# Patient Record
Sex: Male | Born: 1935 | Race: White | Hispanic: No | Marital: Married | Smoking: Never smoker
Health system: Southern US, Community
[De-identification: ages and names within clinical notes are randomized; demographics above are authoritative.]

## PROBLEM LIST (undated history)

## (undated) DIAGNOSIS — C61 Malignant neoplasm of prostate: Secondary | ICD-10-CM

## (undated) DIAGNOSIS — R350 Frequency of micturition: Secondary | ICD-10-CM

## (undated) DIAGNOSIS — I10 Essential (primary) hypertension: Secondary | ICD-10-CM

## (undated) DIAGNOSIS — M1990 Unspecified osteoarthritis, unspecified site: Secondary | ICD-10-CM

## (undated) DIAGNOSIS — E785 Hyperlipidemia, unspecified: Secondary | ICD-10-CM

## (undated) HISTORY — DX: Unspecified osteoarthritis, unspecified site: M19.90

## (undated) HISTORY — PX: ELBOW SURGERY: SHX618

## (undated) HISTORY — DX: Essential (primary) hypertension: I10

## (undated) HISTORY — PX: BACK SURGERY: SHX140

## (undated) HISTORY — DX: Hyperlipidemia, unspecified: E78.5

## (undated) SURGERY — Surgical Case
Anesthesia: *Unknown

---

## 1993-07-20 HISTORY — PX: PROSTATE SURGERY: SHX751

## 2000-09-12 ENCOUNTER — Encounter: Admission: RE | Admit: 2000-09-12 | Discharge: 2000-12-11 | Payer: Self-pay | Admitting: Radiation Oncology

## 2001-04-19 ENCOUNTER — Ambulatory Visit: Admission: RE | Admit: 2001-04-19 | Discharge: 2001-07-18 | Payer: Self-pay | Admitting: Radiation Oncology

## 2001-09-09 ENCOUNTER — Encounter: Admission: RE | Admit: 2001-09-09 | Discharge: 2001-09-09 | Payer: Self-pay | Admitting: Internal Medicine

## 2001-09-09 ENCOUNTER — Encounter: Payer: Self-pay | Admitting: Internal Medicine

## 2001-09-23 ENCOUNTER — Encounter: Payer: Self-pay | Admitting: Thoracic Surgery

## 2001-09-27 ENCOUNTER — Encounter: Payer: Self-pay | Admitting: Thoracic Surgery

## 2001-09-27 ENCOUNTER — Encounter (INDEPENDENT_AMBULATORY_CARE_PROVIDER_SITE_OTHER): Payer: Self-pay | Admitting: Specialist

## 2001-09-27 ENCOUNTER — Inpatient Hospital Stay (HOSPITAL_COMMUNITY): Admission: RE | Admit: 2001-09-27 | Discharge: 2001-10-02 | Payer: Self-pay | Admitting: Thoracic Surgery

## 2001-09-28 ENCOUNTER — Encounter: Payer: Self-pay | Admitting: Thoracic Surgery

## 2001-09-29 ENCOUNTER — Encounter: Payer: Self-pay | Admitting: Thoracic Surgery

## 2001-09-30 ENCOUNTER — Encounter: Payer: Self-pay | Admitting: Thoracic Surgery

## 2001-10-11 ENCOUNTER — Encounter: Payer: Self-pay | Admitting: Thoracic Surgery

## 2001-10-11 ENCOUNTER — Encounter: Admission: RE | Admit: 2001-10-11 | Discharge: 2001-10-11 | Payer: Self-pay | Admitting: Thoracic Surgery

## 2001-11-03 ENCOUNTER — Encounter: Payer: Self-pay | Admitting: Thoracic Surgery

## 2001-11-03 ENCOUNTER — Encounter: Admission: RE | Admit: 2001-11-03 | Discharge: 2001-11-03 | Payer: Self-pay | Admitting: Thoracic Surgery

## 2001-12-14 ENCOUNTER — Encounter: Admission: RE | Admit: 2001-12-14 | Discharge: 2001-12-14 | Payer: Self-pay | Admitting: Thoracic Surgery

## 2001-12-14 ENCOUNTER — Encounter: Payer: Self-pay | Admitting: Thoracic Surgery

## 2002-03-29 ENCOUNTER — Encounter: Admission: RE | Admit: 2002-03-29 | Discharge: 2002-03-29 | Payer: Self-pay | Admitting: Thoracic Surgery

## 2002-03-29 ENCOUNTER — Encounter: Payer: Self-pay | Admitting: Thoracic Surgery

## 2002-08-04 ENCOUNTER — Encounter: Payer: Self-pay | Admitting: Thoracic Surgery

## 2002-08-04 ENCOUNTER — Encounter: Admission: RE | Admit: 2002-08-04 | Discharge: 2002-08-04 | Payer: Self-pay | Admitting: Thoracic Surgery

## 2003-02-09 ENCOUNTER — Encounter: Admission: RE | Admit: 2003-02-09 | Discharge: 2003-02-09 | Payer: Self-pay | Admitting: Thoracic Surgery

## 2003-02-09 ENCOUNTER — Encounter: Payer: Self-pay | Admitting: Thoracic Surgery

## 2003-07-21 HISTORY — PX: LUNG BIOPSY: SHX232

## 2003-07-21 HISTORY — PX: LOBECTOMY: SHX5089

## 2003-08-15 ENCOUNTER — Encounter: Admission: RE | Admit: 2003-08-15 | Discharge: 2003-08-15 | Payer: Self-pay | Admitting: Thoracic Surgery

## 2003-10-05 ENCOUNTER — Encounter: Admission: RE | Admit: 2003-10-05 | Discharge: 2003-10-05 | Payer: Self-pay | Admitting: Gastroenterology

## 2003-10-24 ENCOUNTER — Encounter: Admission: RE | Admit: 2003-10-24 | Discharge: 2003-10-24 | Payer: Self-pay | Admitting: Specialist

## 2004-01-03 ENCOUNTER — Inpatient Hospital Stay (HOSPITAL_COMMUNITY): Admission: RE | Admit: 2004-01-03 | Discharge: 2004-01-07 | Payer: Self-pay | Admitting: Orthopaedic Surgery

## 2004-02-27 ENCOUNTER — Encounter: Admission: RE | Admit: 2004-02-27 | Discharge: 2004-02-27 | Payer: Self-pay | Admitting: Thoracic Surgery

## 2004-09-10 ENCOUNTER — Encounter: Admission: RE | Admit: 2004-09-10 | Discharge: 2004-09-10 | Payer: Self-pay | Admitting: Thoracic Surgery

## 2005-03-18 ENCOUNTER — Encounter: Admission: RE | Admit: 2005-03-18 | Discharge: 2005-03-18 | Payer: Self-pay | Admitting: Thoracic Surgery

## 2005-09-16 ENCOUNTER — Encounter: Admission: RE | Admit: 2005-09-16 | Discharge: 2005-09-16 | Payer: Self-pay | Admitting: Thoracic Surgery

## 2006-03-17 ENCOUNTER — Encounter: Admission: RE | Admit: 2006-03-17 | Discharge: 2006-03-17 | Payer: Self-pay | Admitting: Thoracic Surgery

## 2006-09-15 ENCOUNTER — Encounter: Admission: RE | Admit: 2006-09-15 | Discharge: 2006-09-15 | Payer: Self-pay | Admitting: Thoracic Surgery

## 2006-09-15 ENCOUNTER — Ambulatory Visit: Payer: Self-pay | Admitting: Thoracic Surgery

## 2006-12-14 ENCOUNTER — Encounter: Admission: RE | Admit: 2006-12-14 | Discharge: 2006-12-14 | Payer: Self-pay | Admitting: General Surgery

## 2006-12-15 ENCOUNTER — Ambulatory Visit (HOSPITAL_BASED_OUTPATIENT_CLINIC_OR_DEPARTMENT_OTHER): Admission: RE | Admit: 2006-12-15 | Discharge: 2006-12-15 | Payer: Self-pay | Admitting: General Surgery

## 2008-01-24 ENCOUNTER — Inpatient Hospital Stay (HOSPITAL_COMMUNITY): Admission: EM | Admit: 2008-01-24 | Discharge: 2008-01-26 | Payer: Self-pay | Admitting: Emergency Medicine

## 2009-10-23 ENCOUNTER — Encounter: Admission: RE | Admit: 2009-10-23 | Discharge: 2009-10-23 | Payer: Self-pay | Admitting: Orthopaedic Surgery

## 2009-12-13 ENCOUNTER — Ambulatory Visit (HOSPITAL_COMMUNITY): Admission: RE | Admit: 2009-12-13 | Discharge: 2009-12-13 | Payer: Self-pay | Admitting: Internal Medicine

## 2010-04-07 ENCOUNTER — Ambulatory Visit (HOSPITAL_COMMUNITY): Admission: RE | Admit: 2010-04-07 | Discharge: 2010-04-07 | Payer: Self-pay | Admitting: Urology

## 2010-07-20 HISTORY — PX: JOINT REPLACEMENT: SHX530

## 2010-09-04 ENCOUNTER — Other Ambulatory Visit: Payer: Self-pay | Admitting: Orthopedic Surgery

## 2010-09-04 ENCOUNTER — Other Ambulatory Visit (HOSPITAL_COMMUNITY): Payer: Self-pay | Admitting: Orthopedic Surgery

## 2010-09-04 ENCOUNTER — Ambulatory Visit (HOSPITAL_COMMUNITY)
Admission: RE | Admit: 2010-09-04 | Discharge: 2010-09-04 | Disposition: A | Discharge status: Home / Self Care | Payer: Medicare Other | Source: Ambulatory Visit | Attending: Orthopedic Surgery | Admitting: Orthopedic Surgery

## 2010-09-04 ENCOUNTER — Encounter (HOSPITAL_COMMUNITY): Payer: Medicare Other

## 2010-09-04 DIAGNOSIS — Z01811 Encounter for preprocedural respiratory examination: Secondary | ICD-10-CM

## 2010-09-04 DIAGNOSIS — M1710 Unilateral primary osteoarthritis, unspecified knee: Secondary | ICD-10-CM | POA: Insufficient documentation

## 2010-09-04 DIAGNOSIS — Z01818 Encounter for other preprocedural examination: Secondary | ICD-10-CM | POA: Insufficient documentation

## 2010-09-04 LAB — BASIC METABOLIC PANEL
BUN: 9 mg/dL (ref 6–23)
Chloride: 101 mEq/L (ref 96–112)
Creatinine, Ser: 0.97 mg/dL (ref 0.4–1.5)
GFR calc Af Amer: >60 mL/min (ref >60)
GFR calc non Af Amer: >60 mL/min (ref >60)
Glucose, Bld: 90 mg/dL (ref 70–99)
Potassium: 4.3 mEq/L (ref 3.5–5.1)
Sodium: 141 mEq/L (ref 135–145)

## 2010-09-04 LAB — DIFFERENTIAL
Basophils Relative: 1 % (ref 0–1)
Eosinophils Absolute: 0.1 10*3/uL (ref 0.0–0.7)
Eosinophils Relative: 1 % (ref 0–5)
Lymphs Abs: 2.2 10*3/uL (ref 0.7–4.0)
Monocytes Absolute: 0.7 10*3/uL (ref 0.1–1.0)
Monocytes Relative: 9 % (ref 3–12)
Neutrophils Relative %: 57 % (ref 43–77)

## 2010-09-04 LAB — URINALYSIS, ROUTINE W REFLEX MICROSCOPIC
Specific Gravity, Urine: 1.012 (ref 1.005–1.030)
Urine Glucose, Fasting: NEGATIVE mg/dL

## 2010-09-04 LAB — CBC
RBC: 4.56 MIL/uL (ref 4.22–5.81)
WBC: 6.9 10*3/uL (ref 4.0–10.5)

## 2010-09-08 NOTE — H&P (Signed)
  NAME:  Justin Porter, Justin Porter NO.:  [id number]  MEDICAL RECORD NO.:  [id number]          PATIENT TYPE:  OUT  LOCATION:  XRAY                         FACILITY:  [facility]  PHYSICIAN:  Madlyn Frankel. Charlann Boxer, M.D.  DATE OF BIRTH:  30-Dec-1928  DATE OF ADMISSION:  04/07/2010 DATE OF DISCHARGE:  04/07/2010                             HISTORY & PHYSICAL   ADMISSION DIAGNOSIS:  Left knee osteoarthritis.  BRIEF HISTORY:  This patient has been followed here for some time for conservative treatment of bilateral knee osteoarthritis.  He has failed conservative treatments, decided to proceed with first left knee arthroplasty and then a right knee arthroplasty later.  MEDICAL PROBLEMS: 1. High cholesterol. 2. Generalized osteoarthritis. 3. History of prostate and lung cancer. 4. Back surgery in 2007.  CURRENT MEDICATION LIST: 1. Furosemide 20 mg 2 a day. 2. Simvastatin 20 mg a day. 3. Fish oil daily. 4. Multivitamins daily. 5. Potassium daily.  DRUG ALLERGY:  CODEINE.  SURGICAL HISTORY:  The prostate in February 1995, lung in 2005, and back surgery in 2007.  SOCIAL HISTORY:  The patient is married, retired.  No history of alcohol or tobacco use.  He has 2 children.  DISPOSITION PLAN:  [facility] for rehab.  FAMILY HISTORY:  Noncontributory.  REVIEW OF SYSTEMS:  Notable for those difficulties described in the history of present illness, past medical history.  Review of systems sheet is otherwise unremarkable.  PHYSICAL EXAMINATION:  VITAL SIGNS:  The patient is 6 feet 185 pounds. His blood pressure today is 130/84.  His respirations 20s.  His pulse is 80. GENERAL:  His general health is fair. HEENT:  Normocephalic, with reading glasses. NECK:  Unremarkable. CHEST:  Clear to auscultation bilaterally. HEART:  S1, S2.  No murmurs, rubs, or gallops heard. ABDOMEN:  Soft, nondistended. GI/GU:  Unremarkable except for a history of prostate cancer. EXTREMITIES:  Lower  extremity osteoarthritis. DERMATOLOGIC/NEUROLOGIC:  Intact.  LABORATORY DATA:  EKG and chest x-ray are pending through [facility].  IMPRESSION:  Left knee osteoarthritis.  PLAN:  He will be admitted on February 21st for left total knee arthroplasty with Dr. Charlann Boxer.     Russell L. Webb Silversmith, RN   ______________________________ Madlyn Frankel Charlann Boxer, M.D.    RLW/MEDQ  D:  08/28/2010  T:  08/29/2010  Job:  [id number]  Electronically Signed by Lauree Chandler NP-C on 09/01/2010 02:54:10 PM Electronically Signed by Durene Romans M.D. on 09/08/2010 10:49:06 AM

## 2010-09-09 ENCOUNTER — Inpatient Hospital Stay (HOSPITAL_COMMUNITY)
Admission: RE | Admit: 2010-09-09 | Discharge: 2010-09-12 | DRG: 470 | Disposition: A | Discharge status: Skilled Nursing Facility | Payer: Medicare Other | Source: Ambulatory Visit | Attending: Orthopedic Surgery | Admitting: Orthopedic Surgery

## 2010-09-09 DIAGNOSIS — Z8546 Personal history of malignant neoplasm of prostate: Secondary | ICD-10-CM

## 2010-09-09 DIAGNOSIS — E7800 Pure hypercholesterolemia, unspecified: Secondary | ICD-10-CM | POA: Diagnosis present

## 2010-09-09 DIAGNOSIS — M1710 Unilateral primary osteoarthritis, unspecified knee: Principal | ICD-10-CM | POA: Diagnosis present

## 2010-09-09 LAB — TYPE AND SCREEN: ABO/RH(D): A POS

## 2010-09-10 LAB — CBC
HCT: 38.3 % — ABNORMAL LOW (ref 39.0–52.0)
MCH: 32.2 pg (ref 26.0–34.0)
MCV: 95.5 fL (ref 78.0–100.0)
RDW: 12.6 % (ref 11.5–15.5)
WBC: 13.1 10*3/uL — ABNORMAL HIGH (ref 4.0–10.5)

## 2010-09-10 LAB — BASIC METABOLIC PANEL
BUN: 10 mg/dL (ref 6–23)
Creatinine, Ser: 0.93 mg/dL (ref 0.4–1.5)
GFR calc non Af Amer: >60 mL/min (ref >60)
Glucose, Bld: 135 mg/dL — ABNORMAL HIGH (ref 70–99)
Potassium: 4.1 mEq/L (ref 3.5–5.1)

## 2010-09-11 ENCOUNTER — Other Ambulatory Visit (HOSPITAL_COMMUNITY): Payer: Self-pay | Admitting: Physical Therapy

## 2010-09-11 LAB — CBC
HCT: 32.2 % — ABNORMAL LOW (ref 39.0–52.0)
MCHC: 33.5 g/dL (ref 30.0–36.0)
MCV: 95 fL (ref 78.0–100.0)
Platelets: 161 10*3/uL (ref 150–400)
RDW: 12.8 % (ref 11.5–15.5)

## 2010-09-11 LAB — BASIC METABOLIC PANEL
BUN: 8 mg/dL (ref 6–23)
Calcium: 7.9 mg/dL — ABNORMAL LOW (ref 8.4–10.5)
GFR calc non Af Amer: >60 mL/min (ref >60)
Glucose, Bld: 117 mg/dL — ABNORMAL HIGH (ref 70–99)
Sodium: 133 mEq/L — ABNORMAL LOW (ref 135–145)

## 2010-09-12 NOTE — Op Note (Signed)
NAME:  Justin Porter, LETARTE NO.:  [id number]  MEDICAL RECORD NO.:  [id number]           PATIENT TYPE:  I  LOCATION:  1606                         FACILITY:  [facility] [place]  PHYSICIAN:  Madlyn Frankel. Charlann Boxer, M.D.  DATE OF BIRTH:  07-08-1929  DATE OF PROCEDURE:  09/09/2010 DATE OF DISCHARGE:                              OPERATIVE REPORT   PREOPERATIVE DIAGNOSIS:  Left knee osteoarthritis.  POSTOPERATIVE DIAGNOSIS:  Left knee osteoarthritis.  PROCEDURE:  Left total knee replacement.  COMPONENTS USED:  DePuy rotating platform posterior stabilized knee system, size 5 femur, 5 tibia, 10-mm inserted, 41 patellar button.  SURGEON:  Madlyn Frankel. Charlann Boxer, MD  ASSISTANT:  Telford Nab, R.N.  ANESTHESIA:  General.  SPECIMENS:  None.  COMPLICATIONS:  None.  DRAINS:  One Hemovac.  TOURNIQUET TIME:  35 minutes at 250 mmHg.  ESTIMATED BLOOD LOSS:  50 cc left.  INDICATIONS OF PROCEDURE:  Mr. Justin Porter is a very pleasant 77 year old gentleman with bilateral knee osteoarthritis.  He is here for scheduled of a planned bilateral total knee replacement.  Risks and benefits of the procedure were discussed including infection, DVT, component failure, need for revision surgery, postop course and expectations, stiffness.  Consent was obtained for the benefit apparently.  DESCRIPTION OF PROCEDURE:  The patient was brought to the operative theater.  Once adequate anesthesia, preoperative antibiotics, Ancef 2 g were administered, the patient was positioned supine with left thigh tourniquet placed.  Left lower extremity was then prepped and draped in the sterile fashion.  The left foot was placed in the Mayo leg holder.  A time-out was performed identifying the patient, planned procedure, and the extremity.  The leg was exsanguinated, tourniquet elevated to 250 mmHg.  A midline incision was made followed by median parapatellar arthrotomy.  Following initial exposure and debridement of synovium  and part of the meniscus, attention was directed to patella, which was noted be severely arthritic.  Precut measurement of was approximately 26 mm was resected down to about 14 mm and used a 41 patellar button restore height and covered the cut surface.  Lug holes were drilled and a metal shim placed to protect the cut surface from retractors and saw blades.  Attention was now directed to femur.  Femoral canal was opened to drill, irrigated to try to prevent fat emboli.  An intramedullary rod was passed and 5 degrees of valgus, 11 mm of bone was resected off the distal femur due to flexion contracture.  The tibia then subluxated anteriorly and using extramedullary guide, I resected 10 mm bone off the proximal lateral tibia.  I then confirmed the extension gap was going to be stable with 10 mm block in it was. Medial collateral ligament particularly stable with compared to lateral. I confirmed that the cut was perpendicular in the coronal plane as I was going to set my rotation of femoral component with this.  I sized the femur to be a size to 5.  The size 5 rotation block was pinned into position anterior reference utilizing the C clamp to set rotation based off the proximal tibial cut.  The anterior-posterior chamfer cuts were  then made without difficulty, no notching.  The final box cut was made off the lateral aspect of the distal femur.  I removed the posterior medial osteophytes using an osteotome.  At this point, the tibia subluxated anteriorly and a size 5 tibial tray seemed to be fit best particularly after removing osteophytes medially. It was pinned into position, drilled and keel punch trial reduction was carried out with 5 femur, 5 tibia, 10-mm insert.  The knee came to full extension with a little bit of pressure, but nonetheless with full extension with the patella tracked through the trochlea without application pressure.  At this point, trial of components removed.   Final components were opened including the polyethylene insert.  The synovial capsule and the junction of the knee was injected with Marcaine with epinephrine 1 cc of Toradol.  The knee was irrigated with normal saline solution and the cement was mixed.  The final components were then cemented onto clean and dried cut surfaces of bone.  The knee was brought to extension with 10 mm insert and extruded cement was removed.  Once the cement had fully cured.  Excess cement was removed throughout the knee.  Once I was satisfied, I was unable visualize any remaining cement, the final 10 mm insert was placed and the knee reduced.  The knee was reirrigated with pulse lavage normal saline solution.  The medium Hemovac drain was placed deep.  Tourniquet had been let down at 35 minutes without significant hemostasis required.  Extensor mechanism was then reapproximated using #1 Vicryl.  The knee in flexion.  The remaining of the wound was closed with 2-0 Vicryl and running 4-0 Monocryl.  The knee was cleaned, dried, dressed sterilely with Dermabond and Aquacel dressing.  The drain site dressed separately. The knee was wrapped in sterile Ace wrap.  He was then brought to recovery room in stable condition tolerating the procedure well.     Madlyn Frankel Charlann Boxer, M.D.     MDO/MEDQ  D:  09/09/2010  T:  09/10/2010  Job:  [id number]  Electronically Signed by Durene Romans M.D. on 09/12/2010 07:05:13 AM

## 2010-09-15 NOTE — Discharge Summary (Signed)
NAME:  Justin Porter, Justin Porter NO.:  [id number]  MEDICAL RECORD NO.:  [id number]           PATIENT TYPE:  I  LOCATION:  1606                         FACILITY:  [facility]  PHYSICIAN:  Madlyn Frankel. Charlann Boxer, M.D.  DATE OF BIRTH:  11-14-1928  DATE OF ADMISSION:  09/09/2010 DATE OF DISCHARGE:  09/12/2010                              DISCHARGE SUMMARY   ADMISSION DIAGNOSES: 1. Left knee osteoarthritis. 2. Hypercholesterolemia.  DISCHARGE DIAGNOSES: 1. Left knee osteoarthritis. 2. Hypercholesterolemia.  BRIEF ADMITTING HISTORY:  Justin Porter is a very pleasant 77 year old male patient of mine who has been seen and evaluated for bilateral knee osteoarthritis.  Given his persistence of discomfort and limited functional abilities and desire to remain as functional as possible.  At this point, he wished to proceed with knee arthroplasty.  After reviewing risks and benefits of the procedure, he wished at this point to proceed in a staged fashion with his left knee first and he will have his 2nd knee done within a month.  Risks and benefits have been reviewed in the hospital and his plan for a same-day admission.  PROCEDURE:  The patient underwent a left total knee replacement on September 09, 2010.  HOSPITAL COURSE:  The patient was admitted to the hospital for same-day surgery on September 09, 2010.  He underwent a left total knee replacement.  Please see dictated operative note for full details of the procedure as well as an uncomplicated procedure.  Postoperatively, he went to the recovery room where he stayed for a routine amount of time and was subsequently transferred to the orthopedic ward.  He had a planned discharge to a nursing facility in [place], [place] program, required 3-day hospital stay.  His hospital course was uncomplicated.  He was placed on a regular diet.  He was seen and evaluated by Physical Therapy to initiate range of motion and ambulation.  His labs  remained stable with no problems with his electrolytes.  On postoperatively day #2, his hematocrit was 32.2.  His electrolytes were stable with no bump in his creatinine.  His left knee had some postoperative swelling, but his knee never had any drains and his wound remained dry.  By postoperative day #3, he was ready for discharge to nursing a facility.  Arrangements have been made for him to be transferred to [facility].  DISCHARGE INSTRUCTIONS:  The patient is to be seen and evaluated by Physical Therapy to be done once or twice a day; for the routine recommendations, he is to work on knee extension and flexion on his own as well.  Ice could be used on an as needed basis 20 to 30 minutes an hour for swelling or discomfort.  He will be on a regular diet.  He will be regressing towards independence.  His left knee dressing is currently in Aquacel dressing, which will remain in place for 1 week to be removed on Tuesday, February 28th.  Following this, the wound should be covered with gauze and tape.  His current dressing is waterproof, so he may shower at any time.  After this, he should try to keep  his wound as dry as possible.  He will have a followup appointment with me on March 7th.  This is already scheduled for history and physical for his right knee, but we will evaluate his left knee progress at that time.  DISCHARGE MEDICATIONS:  Include his home medications of: 1. Simvastatin 20 mg daily. 2. Fish oil 1200 mg daily. 3. Multivitamin p.o. daily. 4. Potassium 99 over the counter daily. 5. Lasix 20 mg b.i.d. 6. In addition, he will go home on Colace 100 mg p.o. b.i.d. for     constipation prevention while on pain medicine. 7. MiraLax 17 g p.o. daily as needed for constipation while on pain     medicine. 8. Iron 325 mg p.o. b.i.d. for 2 weeks to help stimulate return of his     hemoglobin to normal level.  He will be on pain medicines: 1. Norco 7.5/325 mg 1-2  tablets every 4-6 hours as needed for pain,     analgesic therapy. 2. Robaxin 500 mg p.o. q.6 hours p.r.n. for muscle spasms and pain.  He also at this point will be placed on Xarelto 10 mg p.o. daily.  This is an anticoagulant and its side effects to be monitored.  He will remain on this for a total of 10 days from September 12, 2010.  Following the use of the Xarelto, he will be placed on aspirin 325 mg p.o. daily for 2 weeks, it needs to be stopped 1 week before his next surgery.  Any orthopedic questions can be addressed to [facility], particularly Dr. Durene Romans, at [phone].  Any medical issues can be addressed to his primary medical physician.     Madlyn Frankel Charlann Boxer, M.D.     MDO/MEDQ  D:  09/12/2010  T:  09/12/2010  Job:  [id number]  Electronically Signed by Durene Romans M.D. on 09/15/2010 06:58:58 AM

## 2010-09-30 ENCOUNTER — Other Ambulatory Visit (HOSPITAL_COMMUNITY): Payer: No Typology Code available for payment source

## 2010-10-07 ENCOUNTER — Inpatient Hospital Stay (HOSPITAL_COMMUNITY): Admission: RE | Admit: 2010-10-07 | Payer: Medicare Other | Source: Ambulatory Visit | Admitting: Orthopedic Surgery

## 2010-10-21 ENCOUNTER — Other Ambulatory Visit (HOSPITAL_COMMUNITY): Payer: Self-pay | Admitting: Urology

## 2010-10-21 DIAGNOSIS — C61 Malignant neoplasm of prostate: Secondary | ICD-10-CM

## 2010-11-26 ENCOUNTER — Encounter (HOSPITAL_COMMUNITY)
Admission: RE | Admit: 2010-11-26 | Discharge: 2010-11-26 | Disposition: A | Discharge status: Home / Self Care | Payer: Medicare Other | Source: Ambulatory Visit | Attending: Urology | Admitting: Urology

## 2010-11-26 ENCOUNTER — Other Ambulatory Visit (HOSPITAL_COMMUNITY): Payer: Self-pay | Admitting: Urology

## 2010-11-26 ENCOUNTER — Encounter (HOSPITAL_COMMUNITY): Payer: Self-pay

## 2010-11-26 ENCOUNTER — Ambulatory Visit (HOSPITAL_COMMUNITY)
Admission: RE | Admit: 2010-11-26 | Discharge: 2010-11-26 | Disposition: A | Discharge status: Home / Self Care | Payer: Medicare Other | Source: Ambulatory Visit | Attending: Urology | Admitting: Urology

## 2010-11-26 DIAGNOSIS — C61 Malignant neoplasm of prostate: Secondary | ICD-10-CM

## 2010-11-26 DIAGNOSIS — M47814 Spondylosis without myelopathy or radiculopathy, thoracic region: Secondary | ICD-10-CM | POA: Insufficient documentation

## 2010-11-26 DIAGNOSIS — Z8546 Personal history of malignant neoplasm of prostate: Secondary | ICD-10-CM | POA: Insufficient documentation

## 2010-11-26 DIAGNOSIS — Z96659 Presence of unspecified artificial knee joint: Secondary | ICD-10-CM | POA: Insufficient documentation

## 2010-11-26 DIAGNOSIS — M4130 Thoracogenic scoliosis, site unspecified: Secondary | ICD-10-CM | POA: Insufficient documentation

## 2010-11-26 DIAGNOSIS — R948 Abnormal results of function studies of other organs and systems: Secondary | ICD-10-CM | POA: Insufficient documentation

## 2010-11-26 DIAGNOSIS — R972 Elevated prostate specific antigen [PSA]: Secondary | ICD-10-CM | POA: Insufficient documentation

## 2010-11-26 MED ORDER — TECHNETIUM TC 99M MEDRONATE IV KIT
22.7000 | PACK | Freq: Once | INTRAVENOUS | Status: AC | PRN
  Administered 2010-11-26: 22.7 via INTRAVENOUS

## 2010-12-02 NOTE — Op Note (Signed)
NAME:  Justin Porter, Justin Porter NO.:  [id number]   MEDICAL RECORD NO.:  [id number]          PATIENT TYPE:  AMB   LOCATION:  DSC                          FACILITY:  [facility]   PHYSICIAN:  Angelia Mould. Derrell Lolling, M.D.DATE OF BIRTH:  03-07-1929   DATE OF PROCEDURE:  12/15/2006  DATE OF DISCHARGE:                               OPERATIVE REPORT   PREOPERATIVE DIAGNOSIS:  Left inguinal hernia.   POSTOPERATIVE DIAGNOSIS:  Left inguinal hernia.   OPERATION PERFORMED:  Repair left inguinal hernia with mesh  [facility] repair).   SURGEON:  Angelia Mould. Derrell Lolling, M.D.   OPERATIVE INDICATIONS:  This is a 76 year old white man who has had a  bulge in his left groin for about a year.  He thinks it is getting  larger.  Minimal discomfort.  He has had a past history of left upper  lobe resection for metastatic prostate cancer.  He has done well and his  health is stable.  On exam, he has a lower midline incision and a bulge  in his left groin.  This is partially reducible.  There is no evidence  of hernia elsewhere.  He is brought to the operating room electively.   OPERATIVE TECHNIQUE:  Following the induction of a general LMA  anesthetic, the patient was identified as correct patient, correct  procedure and correct site.  Intravenous antibiotics were given prior to  the incision.  The left groin and genitalia were prepped and draped in  sterile fashion.  We used 0.5% Marcaine with epinephrine as a local  infiltration anesthetic.  An oblique somewhat transverse incision was  made overlying the left inguinal canal.  Dissection was carried down  through subcutaneous tissue exposing the aponeurosis of the external  oblique.  The external oblique was incised in the direction of its  fibers, opening up the external inguinal ring.  The external oblique was  dissected away from underlying tissues and self-retaining retractors  were placed.  The cord structures were mobilized and encircled with a  Penrose drain.  There appeared to be an incarcerated hernia.  I  dissected the cremasteric muscle fibers away.  I found a large indirect  hernia sac which had fatty tissue in it.  I opened the sac and found  that there was adhesion at the apex of the sac which once divided,  I  was able to carefully examine tissues within the sac and reduced them  back into the peritoneal space.  The indirect hernia sac was then  twisted and then suture ligated at the level of the internal ring with a  suture ligature of 2-0 silk.  The redundant sac was excised and  discarded.  The floor of the inguinal canal was repaired and reinforced  with a 3 inch x 6 inch piece of polypropylene mesh.  The mesh was  trimmed at the corners to fit the wound.  The mesh was sutured in place  with interrupted and running sutures of 2-0 Prolene.  The mesh was  sutured so as to generously overlap the fascia at the pubic tubercle.  The mesh was sutured along  the inguinal ligament inferiorly.  Medially  and superomedially, some interrupted mattress sutures of 2-0 Prolene  were placed to secure the mesh.  Superiorly and superolaterally, a  running suture of 2-0 Prolene was placed.  The mesh was incised  laterally so as to wrap around the cord structures at the internal ring.  The tails of the mesh were overlapped and the suture lines completed.  I  placed two more sutures of 2-0 Prolene in the mesh laterally to snug it  up to the internal ring.  This provided a very secure repair both medial  and lateral to the internal ring but allowed an adequate opening for the  cord structures.  Hemostasis was excellent.  The wound was irrigated  with saline.  The external oblique was closed with a running suture of 2-  0 Vicryl placing the cord structures deep to the external oblique.  Scarpa's fascia was closed with 3-0 Vicryl sutures and the skin closed  with a running subcuticular suture of 4-0 Monocryl and Steri-Strips.  Clean bandages  were placed and the patient taken to the recovery room in  stable condition.   ESTIMATED BLOOD LOSS:  About 10 mL.   COMPLICATIONS:  None.   Sponge, needle and instrument counts were correct.      Angelia Mould. Derrell Lolling, M.D.  Electronically Signed     HMI/MEDQ  D:  12/15/2006  T:  12/15/2006  Job:  [id number]   cc:   Georgann Housekeeper, MD

## 2010-12-02 NOTE — Op Note (Signed)
NAME:  Justin Porter, Justin Porter NO.:  [id number]   MEDICAL RECORD NO.:  [id number]          PATIENT TYPE:  INP   LOCATION:  1609                         FACILITY:  [facility]   PHYSICIAN:  Justin Done, MD  DATE OF BIRTH:  Feb 03, 1929   DATE OF PROCEDURE:  01/24/2008  DATE OF DISCHARGE:                               OPERATIVE REPORT   PREOPERATIVE DIAGNOSIS:  Left grade 2 open proximal olecranon fracture.   POSTOPERATIVE DIAGNOSIS:  Left grade 2 open proximal olecranon fracture.   ATTENDING SURGEON:  Justin Covert IV, MD who scrubbed and was present  through the entire procedure.   ASSISTANT SURGEON:  None.   SURGICAL PROCEDURES:  1. Debridement of skin and subcutaneous tissues associated with an      open fracture left elbow.  2. Open reduction and internal fixation left proximal olecranon      fracture with the tension band construct.  3. Stress radiography left elbow.   SURGICAL IMPLANT:  Two 0.65 K-wires and an 18-gauge stainless steel wire  for a tension band construct.   SURGICAL ANESTHESIA:  General via laryngeal mask airway.   TOURNIQUET TIME:  96 minutes at 250 mmHg.   SURGICAL INDICATIONS:  Justin Porter is a 77 year old right-hand-dominant  gentleman who fell and sustained an open proximal ulna fracture.  The  patient was seen and evaluated in the emergency department; and after  the procedure was discussed with he and the family, it was recommended  that he undergo the emergent procedure, given the open nature of the  fracture.  We talked about the risks of surgery to include; but not  limited to bleeding, infection, hardware failure, nonunion, malunion,  need for further surgical intervention, and loss of motion of the elbow.  A signed and informed consent was obtained.   INTRAOPERATIVE FINDINGS:  The patient did have a comminuted proximal  olecranon fracture.  There were several small fragments on both the  radial ulnar aspect of the proximal  olecranon that were removed.  The  central portion of the proximal olecranon was in continuity.  The  fracture was felt amenable to a tension band construct.   Intraoperative radiograph review of the elbow under stress radiography  showed a good anatomical reduction of the proximal olecranon with the  tension band construct plate. The patient did have full forearm rotation  following the placement of implant.  The elbow had stability through a  full range of motion.   DESCRIPTION OF PROCEDURE:  The patient was properly identified in preop  holding area and a permanent marker made at the left elbow to indicate  the correct operative site.  The patient then brought back to the  operating room, placed supine on the anesthesia table.  General  anesthesia was administered via LMA.  The patient tolerated this well.  A well-padded tourniquet was then placed on the left brachium and sealed  with a 1000 drape.  The left upper extremity was then prepped with  Betadine and then sterilely draped.  A time-out was called.  The correct  site was identified and the  procedure was then begun.  The patient did  have a grade 2 open fracture with an approximately 4 cm open wound  directly over the proximal olecranon.  The tourniquet was then  insufflated with 250 mmHg.   The skin incision was then carried out in line with the open wound, both  proximally and distally.  The wound was then thoroughly exposed.  Debridement of the skin, subcutaneous tissue, muscle and bone were then  carried out at the open fracture site.  After a thorough debridement of  the open fracture site.  The fracture hematoma was evacuated, the joint  was exposed, and the joint was then thoroughly evacuated of the  hematoma.  Following this after debridement, attention was then turned  to the tension band construct.  Two parallel K-wires were then placed  from a proximal-to-distal direction engaging the anterior cortex of the  ulna.   The distance was then confirmed using the mini C-arm.  A  transverse drill hole was then made distal to the fracture site and an  18-gauge wire was then placed through the transverse drill hole.  The  wire was then crossed over the dorsal surface of the olecranon, and then  using a 14-gauge Angiocath proceeding from an ulnar-to-radial direction  beneath the triceps mechanism, the 18-gauge wire was then passed.  Two  loops were then formed.  Under simultaneous tension both loops were then  tied down.  Following tying down of the 18-gauge wire, the wire broke;  therefore, this was repeated.  Unfortunately the wire broke 2 times and  with the placement of the third wire, adequate tension was placed on the  18-gauge wire.  As the K-wires were then backed out approximately 1.5 cm  they were then bent, and then cut, rotated anteriorly.  A small rent was  placed in the triceps; and they were tapped down to the bone, engaging  the proximal olecranon.   Following final assembly of the tension band, final radiographs were  then obtained in the AP, lateral, and oblique planes under stress  radiography.  The patient maintained good forearm rotation, and good  adequate motion of the elbow.  The wound was then thoroughly irrigated.  The deep layer directly over the tension band and the fascial layer was  then closed with 2-0 Vicryl suture.  Subcutaneous tissues closed with 4-  0 Vicryl suture.  The skin was then closed with a running 4-0 Nylon  suture.  20 mL of 1/4% Marcaine were infiltrated locally around the  incision site.  Xeroform dressing and sterile compressive dressing was  then applied.  The patient was then placed into a long-arm splint,  immobilizing the elbow, and approximately 80 degrees of elbow flexion.  The patient was then extubated, and taken to recovery room in good  condition.   POSTOPERATIVE PLAN:  The patient will be admitted for approximately 24-  48 hours for Porter antibiotics  and pain control.  He will be discharged  when pain is controlled, and then follow up in the office.  He will be  seen back in the office in 10 days for a wound check, and likely  application of a long-arm cast.  Likely begin motion at the 3-week mark,  depending on the quality of the soft tissues.  Radiographs at the first  visit.  Therapy visit at the 3-week mark, and will begin active motion  of the elbow.  Radiographs at 10 days, 3 weeks, and 6 weeks mark.  Justin Done, MD  Electronically Signed     FWO/MEDQ  D:  01/25/2008  T:  01/25/2008  Job:  [id number]

## 2010-12-02 NOTE — H&P (Signed)
NAME:  ZAKARY, KIMURA NO.:  [id number]   MEDICAL RECORD NO.:  [id number]          PATIENT TYPE:  INP   LOCATION:  0106                         FACILITY:  [facility]   PHYSICIAN:  Erasmo Leventhal, M.D.DATE OF BIRTH:  Feb 10, 1929   DATE OF ADMISSION:  01/24/2008  DATE OF DISCHARGE:                              HISTORY & PHYSICAL   HISTORY OF PRESENT ILLNESS:  Mr. Venable is a very pleasant 77 year old  Caucasian male who fell today onto his outstretched left upper extremity  while bowling.  He was brought to the [facility] emergency room,  evaluated there by Dr. Doug Sou.  Left eyelid laceration was  closed by Dr.  Ethelda Chick.  A CT of the head was negative.  The patient  was found to have a left open elbow fracture as an isolated injury.  I  was consulted.  I evaluated the patient in the emergency room.  He was  wide awake, oriented to person, place, time and circumstance, and only  complained of some left elbow fracture, very pleasant and cooperative,  as I remember him from the office.   ALLERGIES:  CODEINE INTOLERANCE.   MEDICATIONS:  1. Furosemide 40 mg once per day.  2. Potassium 99 mg 1 per day.  3. Multivitamin 1 a day.  4. Fish oil 120 mg 3 per day.  5. Simvastatin 20 mg 1 per day.  6. One baby aspirin per day.   Medical history is significant for hypertension, dyslipidemia.   SURGICAL HISTORY:  Multiple orthopedic surgeries from my office.   FAMILY HISTORY:  Noncontributory.   SOCIAL HISTORY:  Married.  Lives with his wife.  No tobacco.  No  alcohol.   REVIEW OF SYSTEMS:  Unremarkable.  Has been very healthy lately.   PHYSICAL EXAMINATION:  GENERAL:  A very pleasant gentleman in no acute  distress.  Oriented to person, place, time and circumstance, accompanied  by his family.  HEENT:  Unremarkable except for a laceration above his left eyebrow,  which Dr. Ethelda Chick is closing.  NECK:  Supple.  Full range of motion without  organomegaly, JVD, or  bruits.  HEART:  Regular rate and rhythm without murmurs, gallops or rubs.  ABDOMEN:  Normal bowel sounds.  Soft, nontender.  GENITOURINARY:  Deferred.  EXTREMITIES:  Isolated left upper extremity problem with 3-cm oblique  laceration along the olecranon with visible bone.  No gross  contamination.   This was irrigated with Betadine solution.  Sterile dressing was applied  and a fiberglass splint.   Distally, he did have some mild ulnar nerve changes with decreased  sensation in the little finger and slightly decreased motor strength.  On further questioning of the patient, he stated that he has had  difficulty with this off and on for the past couple of years.  Plain x-  rays were reviewed by Dr. Melvyn Novas and described as an olecranon fracture  displaced.   IMPRESSION:  Left type 2 open olecranon fracture.   RECOMMENDATION:  IV antibiotics given in the emergency room.  Tetanus  toxoid is updated.  Dr. Melvyn Novas is to attend  the patient with irrigation  and debridement, open reduction internal fixation.  He will be admitted  and surgery this evening.  All questions encouraged and answered to  patient in detail, also encouraged to have answered all questions with  Dr. Melvyn Novas over the phone and also discussed with the family, and they  are very grateful and appreciative of our help and will be glad to have  Dr. Melvyn Novas take care of the patient.  All questions encouraged and  answered.           ______________________________  Erasmo Leventhal, M.D.     RAC/MEDQ  D:  01/24/2008  T:  01/24/2008  Job:  [id number]   cc:   [facility]

## 2010-12-02 NOTE — Discharge Summary (Signed)
NAME:  ICKER, SWIGERT NO.:  [id number]   MEDICAL RECORD NO.:  [id number]          PATIENT TYPE:  INP   LOCATION:  1609                         FACILITY:  [facility]   PHYSICIAN:  Madelynn Done, MD  DATE OF BIRTH:  August 11, 1928   DATE OF ADMISSION:  01/24/2008  DATE OF DISCHARGE:  01/26/2008                               DISCHARGE SUMMARY   ADMISSION DIAGNOSES:  1. Grade 2 left open proximal olecranon fracture.  2. Left forehead laceration.  3. Hypertension.  4. Hypercholesterolemia.   DISCHARGE DIAGNOSES:  1. Grade 2 left open proximal olecranon fracture.  2. Left forehead laceration.  3. Hypertension.  4. Hypercholesterolemia.   PROCEDURES AND DATES:  Open reduction internal fixation and debridement  of open fracture on January 24, 2008.   DISCHARGE MEDICATIONS:  1. Percocet 1 to 2 tablets p.o. q.4 to 6 hours as needed for pain.  2. Colace 100 mg p.o. b.i.d.  3. Keflex 500 mg p.o. q.i.d.   REASON FOR ADMISSION:  Mr. Hattabaugh is a 77 year old right-hand-dominant  gentleman who fell while at the bowling alley and sustained an open  injury to his left elbow.  The patient seen and evaluated in the  emergency department, and taken to surgery on the night of admission  because of the open fracture.  The patient had his facial laceration  closed in the emergency department.   HOSPITAL COURSE:  The patient was admitted to the orthopedic floor  following the above procedure.  He was continued on IV antibiotics and  IV pain medications.  Postoperatively, he did very well.  He was seen  and examined on postoperative day #1 and #2.  He remained afebrile.  His  vital signs remained stable and normal throughout.  He was tolerating a  regular diet, voiding well.  The patient was seen and examined on  postoperative day #2 and felt ready to be discharged to home.   RECOMMENDATIONS AND DISPOSITION:  The patient will be discharged to home  with his wife.  He is to continue  with the above discharge medications.  He is going to come back to the office and see me back in 7 days.  He is  to keep the splint clean and dry.  He is to come back to the office  sooner if he has any fevers, chills, nausea or vomiting, worsening pain,  or problems with his left elbow.  Prior to his discharge, the patient's  discharge  instructions were explained to him, his questions were answered.  The  patient voiced understanding of discharge instructions.  The patient has  no use of the left arm, keeping his hand elevated while at rest.   CONDITION ON DISCHARGE:  Good.      Madelynn Done, MD  Electronically Signed     FWO/MEDQ  D:  01/26/2008  T:  01/26/2008  Job:  [id number]

## 2010-12-05 NOTE — Discharge Summary (Signed)
NAME:  Justin Porter, Justin Porter NO.:  [id number]   MEDICAL RECORD NO.:  [id number]                   PATIENT TYPE:  INP   LOCATION:  5012                                 FACILITY:  [facility]   PHYSICIAN:  Sharolyn Douglas, M.D.                     DATE OF BIRTH:  May 06, 1929   DATE OF ADMISSION:  01/03/2004  DATE OF DISCHARGE:  01/07/2004                                 DISCHARGE SUMMARY   ADMISSION DIAGNOSES:  1. Spinal stenosis and degenerative disease at L2-3, L3-4, and L4-5.  2. Hypertension.  3. History of prostate cancer.  4. History of lung cancer.   DISCHARGE DIAGNOSES:  1. Status post L2 to L5 laminectomy and L3-4 posterior spinal fusion.  Doing     well.  2. Mild postoperative anemia that was stable and asymptomatic and did not     require blood transfusion.  3. Postoperative nausea, resolved by postoperative day #3.  4. Hypertension.  5. History of prostate cancer.  6. History of lung cancer.   PROCEDURES:  1. On January 02, 2004, the patient was taken to the operating room for a L2 to     L5 laminectomy, L3-4 posterior spinal fusion with pedicle screws and     ___________.  Surgeon was Dr. Sharolyn Douglas.  Assistant was [employer],     P.A.-C.  Anesthesia was general.  2. EKG from January 03, 2004, showed sinus bradycardia, no significant changes     since last tracing.  Read by Dr. Olga Millers.  3. X-rays from January 03, 2004, portable spine.  Shows pedicle screw placement     at L3-4.   LABORATORY DATA:  Preoperatively, CBC with diff was within normal limits.  Postoperatively, H&H were monitored and showed a low of 11.8 and 33.4, on  January 06, 2004.  However, he was asymptomatic and did not require any  transfusion.  PT/INR and PTT from preoperative were normal.  Complete  metabolic panel preoperatively was normal.  UA preoperatively was negative.  Blood typing preoperatively was type A, _________positive, antibody screen  negative.   HISTORY:  The  patient is a 77 year old male who has been treated by Dr.  [name] for back pain and lower extremity pain.  It has unfortunately been  getting progressively worse, and got to the point where it was limiting his  activities as well as his quality of life.  He has tried numerous methods of  conservative management.  Unfortunately, nothing has given him any lasting  relief of his symptoms.  Secondary to his MRI findings, x-ray findings, as  well as his severe pain and dysfunction, it was thought the best course of  management would be a laminectomy as well as posterior spinal fusion.  Risks  and benefits of this were discussed with him as well as his family by Dr.  [name] as well as myself, he indicated understanding  and opted to proceed.   HOSPITAL COURSE:  On January 03, 2004, the patient was admitted to the hospital  and taken to the operating room for the above listed procedure.  He  tolerated the procedure well without any intraoperative complications.  There was one Hemovac drain placed.  He was transferred to the recovery room  in stable condition.  Postoperatively, routine orthopaedic spine protocol  was followed, including prophylactic antibiotics, early mobilization, slow  advancement of diet after flatus.  Physical therapy and occupational therapy  consults to work with the patient on progressive ambulation program, back  precautions, brace use.  Throughout his hospital stay, he did very well,  ambulating and increasing his activity with physical therapy.  He did  develop some nausea postoperatively on postoperative day #1 to postoperative  day #2.  This was monitored closely and did resolve and did not require any  treatments beyond antiemetics.  By January 06, 2004, his nausea had resolved.  He had a bowel movement.  He was feeling well and moving quite well.  By  January 07, 2004, the patient had met all orthopaedic goals.  He was stable and  independent with physical therapy.  He was also  medically stable and ready  for discharge.  Discharge planners were consulted to help arrange home  health physical therapy as well as home needs prior to discharge.   DIAGNOSES:  The patient is a 77 year old male status post lumbar laminectomy  and posterior spinal fusion, doing well.   ACTIVITY:  Daily ambulation program, brace should be on when he is up.  Back  precautions at all times.  No lifting heavier than 5 pounds.  May shower on  postoperative day #5.  Should continue to keep the incision dry until that  time.  Dressing changes daily.   FOLLOWUP:  Dr. [name] two weeks postoperatively.   DISCHARGE MEDICATIONS:  1. Percocet for pain.  2. Robaxin for muscle spasm.  3. Multivitamin daily.  4. Calcium daily.  5. Laxative if needed.   Avoid NSAIDS x3 months.   DIET:  Regular home diet as tolerated.   CONDITION ON DISCHARGE:  Stable and improved.   DISPOSITION:  The patient is being discharged to his home with his family,  as well as [place] for home health physical therapy and occupational  therapy.      Verlin Fester, P.A.                       Sharolyn Douglas, M.D.    CM/MEDQ  D:  01/30/2004  T:  01/30/2004  Job:  [id number]

## 2010-12-05 NOTE — Discharge Summary (Signed)
[place]. [facility]  Patient:    Justin Porter, Justin Porter Visit Number: [id number] MRN: [id number]          Service Type: SUR Location: 3300 3313 [id number] Attending Physician:  Cameron Proud Dictated by:   Maxwell Marion, RNFA Admit Date:  09/27/2001 Discharge Date: 10/02/2001   CC:         Clinton Sawyer, M.D.  Samul Dada, M.D.   Discharge Summary  DATE OF BIRTH:  1929-05-30  ADMITTING DIAGNOSES:  Left upper lobe lung lesion.  PAST MEDICAL HISTORY: 1. Prostate cancer, status post radical prostatectomy and radiation therapy    by Dr. Wanda Plump and Dr. Dan Humphreys. 2. Hypertension. 3. Osteoarthritis of the knees. 4. Allergic rhinitis. 5. History of fracture of the clavicle.  ALLERGIES:  CODEINE and SHRIMP.  DISCHARGE DIAGNOSIS:  Well-differentiated adenocarcinoma of the left lung, status post left VATS, thoracotomy, and resection.  BRIEF HISTORY:  Justin Porter is a 77 year old Caucasian man.  A left upper lobe lung lesion was found on routine chest x-ray during his annual physical exam with Dr. Rene Paci.  Dr. Rene Paci sent him for a CT scan of the chest.  This scan confirmed the lesion and measured it at approximately 10 mm.  He was referred to Dr. Edwyna Shell and was evaluated in the office on March 5.  After examination of the patient, review of the records including the CT scan, Dr. Edwyna Shell recommended surgical resection of this tumor.  HOSPITAL COURSE:  On September 27, 2001, Justin Porter was electively admitted to [facility] under the care of Dr. Algis Downs. Karle Plumber.  He underwent the following surgical procedure: Left video-assisted thoracoscopy, mini-thoracotomy, left upper lobectomy, and node dissection.  He tolerated the procedure well and was transferred in stable condition to the PACU.  Frozen section pathology at the time of surgery revealed adenocarcinoma.  Justin Porter has been hemodynamically stable in the postoperative period  and his course has been uneventful.  Final pathology has returned T1 N0 M0, well-differentiated adenocarcinoma, nodes all negative for malignant carcinoma.  On postoperative day #2, oncology consult was obtained.  Dr. Arline Asp will follow Justin Porter as an outpatient.  Workup including a bone scan and CT scan of the brain were completed.  Both of these tests were negative for malignancy.  Justin Porter is progressing very well in the recovery room from surgery.  It is anticipated that he will be ready for discharge home tomorrow, October 02, 2001.  CONDITION ON DISCHARGE:  Improved.  INSTRUCTIONS ON DISCHARGE:  Medications, activity, wound care, diet and follow-up appointment.  Please see the discharge instruction sheet for details.  MEDICATIONS ON DISCHARGE: 1. Ultram 50 mg, 1-2 p.o. q.4-6h p.r.n. for pain. 2. Protonix 40 mg, 1 p.o. q.d. 3. He has also been instructed to resume his home medications: Lasix    20 mg p.o. b.i.d.; K-Plus 99 mg p.o. q.d.; and enteric-coated aspirin    81 mg p.o. q.d.  FOLLOWUP: 1. He will have an appointment to see Dr. Edwyna Shell at the [facility] office in    approximately one week.  The office will call with date and time for that    appointment. 2. He will be asked to get a chest x-ray and chest CT approximately one hour    before that appointment. Dictated by:   Maxwell Marion, RNFA Attending Physician:  Cameron Proud DD:  10/01/01 TD:  10/03/01 Job: [id number]

## 2010-12-05 NOTE — Consult Note (Signed)
[place]. [facility]  Patient:    Justin Porter, Justin Porter Visit Number: [id number] MRN: [id number]          Service Type: SUR Location: 3300 3313 01 Attending Physician:  Cameron Proud Dictated by:   Rosemarie Ax, N.P. Proc. Date: 09/29/01 Admit Date:  09/27/2001 Discharge Date: 10/02/2001   CC:         Norton Blizzard, M.D.  Quitman Livings, M.D.  [facility]   Consultation Report  DATE OF BIRTH:  01/27/1929  CONSULTING PHYSICIAN:  Samul Dada, M.D.  REASON FOR CONSULTATION:  Adenocarcinoma.  REQUESTING PHYSICIAN:  D. Karle Plumber, M.D.  HISTORY OF PRESENT ILLNESS:  This is a 77 year old gentleman referred to Dr. Edwyna Shell by Dr. Roseanne Reno after routine physical exam revealed a left lung lesion. He is now status post left VATS and left upper lobectomy. Pathology [id number]) shows invasive well-differentiated adenocarcinoma. Maximum tumor size is 0.7 cm, 0 of 4 lymph nodes examined were positive for tumor. TNM code: pT1 pN0 pMX. CT of the chest on September 09, 2001 showed only a solitary lesion.  PAST MEDICAL HISTORY: 1. Prostate cancer 1995 status post radiation therapy by Dr. Dan Humphreys. 2. Fractured clavicle. 3. Allergic rhinitis. 4. Hypertension. 5. Osteoarthritis.  PAST SURGICAL HISTORY: 1. Radical prostatectomy. 2. Hemorrhoidectomy. 3. Tonsillectomy. 4. Arthroscopic knee bilaterally. 5. Arthroscopic right elbow.  ALLERGIES:  CODEINE and SHRIMP.  MEDICATIONS: 1. Lasix 20 mg one p.o. b.i.d. 2. ASA 81 mg one p.o. q.d. 3. Potassium chloride 99 mg p.o. q.d.  FAMILY HISTORY:  His father died at age 54 of CHF. His mother died at age 64 of CAD. There is one sister alive and well, and two brothers alive and well.  SOCIAL HISTORY:  The patient lives in [place]. He has been married to [place] for 49-1/2 years. They have two sons. He worked in IT sales professional. He was exposed to a large amount of smoke in  the office but has no smoking history, and he has no history of alcohol use.  REVIEW OF SYSTEMS:  The patient denies cough, dyspnea, hemoptysis. He has had no fever, chills, diarrhea, or constipation. He denies any fatigue or weakness, no night sweats or weight change. He does have hearing loss and wears bilateral hearing aids.  PHYSICAL EXAMINATION:  GENERAL:  This is a 77 year old white male with notable hearing loss.  VITAL SIGNS:  His temperature is 97, pulse 70, respirations are 18, blood pressure 118/68, O2 saturations are 94% on 2 liters.  HEENT:  Normocephalic. EOM intact, PERRLA. Mucous membranes are dry. Sclerae nonicteric. Oropharynx without plaque or lesions.  NODES:  No cervical, axillary, or inguinal nodes appreciated.  LUNGS:  There is decreased air exchange throughout the left lung field. There are crackles on the right hemothorax.  CARDIOVASCULAR:  Regular rate and rhythm. No murmur, no gallop.  ABDOMEN:  There is some distention without tenderness.  EXTREMITIES:  Without cyanosis, clubbing, or edema. There is no calf tenderness.  NEUROLOGICAL:  Cranial nerves II-XII are intact. Alert and oriented x3. DTRs are 1+. Upper and lower strength is 5/5.  LABORATORY DATA:  CBC:  WBC is 11.8, hemoglobin 13.3, hematocrit 37.8, and platelets are at 197,000.  ASSESSMENT AND PLAN:  This is a 77 year old white male with a history of prostate cancer now with a primary lung lesion status post left upper lobectomy. CT of the brain and bone scan are currently pending. The patient was seen and examined by Dr. Dorinda Hill  Murinson who has reviewed the pathology report, which shows a well-differentiated adenocarcinoma of the left upper lung. The patient now status post VATS and left upper lung lobectomy. Margins were negative. There is a questionable lesion of the left outer temporal bone on a bone scan. Dr. Arline Asp will follow up with the patient in about one month. Dr. Arline Asp  feels that he needs to rule out metastatic prostate cancer. He will plan to review the scans.  Thank you very much for this consultation, and again, Dr. Arline Asp will follow up with the patient in approximately one months time. Dictated by:   Rosemarie Ax, N.P. Attending Physician:  Cameron Proud DD:  10/13/01 TD:  10/13/01 Job: [id number]

## 2010-12-05 NOTE — Op Note (Signed)
NAME:  Justin Porter, Justin Porter NO.:  [id number]   MEDICAL RECORD NO.:  [id number]                   PATIENT TYPE:  INP   LOCATION:  5012                                 FACILITY:  [facility]   PHYSICIAN:  Sharolyn Douglas, M.D.                     DATE OF BIRTH:  May 31, 1929   DATE OF PROCEDURE:  01/03/2004  DATE OF DISCHARGE:                                 OPERATIVE REPORT   PREOPERATIVE DIAGNOSIS:  Degenerative lumbar spondylolisthesis and severe  spinal stenosis.   POSTOPERATIVE DIAGNOSIS:  Degenerative lumbar spondylolisthesis and severe  spinal stenosis.   OPERATION PERFORMED:  1. Lumbar laminectomy, L2-3, L3-4 and L4-5 with bilateral decompression of     the thecal sac and nerve roots.  2. Posterior spinal arthrodesis, L3-4.  3. Pedicle screw instrumentation, L3-4 using the Spinal  Concepts system.  4. Local autogenous bone graft.  5. Neuro monitoring utilizing the Neuro vision system, testing of four     pedicle screws with triggered EMGs, monitoring of free running EMGs times     three hours.   SURGEON:  Sharolyn Douglas, M.D.   ASSISTANT:  Verlin Fester, P.A.   ANESTHESIA:  General endotracheal.   COMPLICATIONS:  None.   INDICATIONS FOR PROCEDURE:  The patient is a very pleasant 77 year old male  who has severe lumbar spinal stenosis secondary to degenerative  spondylolisthesis and spondylosis.  He has a rotatory subluxation of L3 on  4.  His MRI scan demonstrates marked spinal stenosis at L3-4, moderately  severe spinal stenosis at L2-3 and L4-5.  He has elected to undergo  decompression and fusion in hopes of improving his symptomatology.   DESCRIPTION OF PROCEDURE:  The patient was properly identified in the  holding area and taken to the operating room.  He underwent general  endotracheal anesthesia without difficulty.  He was given prophylactic IV  antibiotics.  Turned prone on the Wilson frame.  All bony prominences were  padded.  Face and eyes were  protected at all times.  Back prepped and draped  in the usual sterile fashion.  A 10 cm incision was made over the L2-3, L3-4  and L4-5 levels.  Dissection was carried sharply through the deep fascia.  We encountered large bony spurs and apparent ankylosis of the L4-5 levels.  Dissection was carried sharply to the deep fascia.  We encountered large  bony spurs and apparent ankylosis of the L4-5 facet joints.  The L3-4 joints  were also severely hypertrophied and partially fused.  At L2-3 there  appeared to be motion.  The transverse processes of L3 and L4 were exposed  bilaterally.  Deep retractors were placed.  We then completed a central  laminectomy by removing the spinous processes of L2, L3, L4. The ligamentum  flavum and facet joints were hypertrophied.  A high speed bur was used to  thin the lamina.  Kerrison punches completed  the laminectomy.  We found  severe spinal stenosis at L3-4 secondary to hypertrophy of the posterior  elements and chronic appearing facet cyst.  Moderately severe stenosis was  identified at L3-4 and L4-5.  The lateral recess was decompressed.  We  checked the L3, L4 and L5 nerve roots with a blunt probe and felt that they  were patent in the canal and through the foramen.  We then turned our  attention to performing a posterior spinal arthrodesis at L3 and 4.  A high  speed bur was used to decorticate the transverse processes.  We then packed  the local autogenous bone graft collected from the laminectomy and run  through a bone mill into the lateral gutters.  We then turned our attention  to placing pedicle screw instrumentation at L3 and 4 using anatomic probing  technique.  Each pedicle was palpated from within the spinal canal.  The  pedicle entry zone was identified.  Initiated with a pedicle awl.  The  pedicle was then palpated confirming there were no breeches.  We placed 6.5  x 55 mm screws at L3 and 6.5 x 50 mm screws at L4.  We had good screw   purchase.  Each screw was then stimulated using triggered EMGs.  There were  no deleterious changes.  We monitored free running EMGs throughout the  procedure and there were no changes.  We did not feel there was any  necessity of completing a fusion at L4-5 as the facet joints had ankylosed.  The wound was irrigated.  Deep Hemovac drain left in place.  Deep fascia  closed with a running #1 Vicryl suture, subcutaneous layer closed with 2-0  Vicryl followed by a running 3-0 subcuticular Vicryl suture.  Benzoin and  Steri-Strips were placed.  Sterile dressing applied.  The patient was turned  supine, extubated without difficulty and transported to the recovery room in  stable condition, able to move the upper and lower extremities.                                               Sharolyn Douglas, M.D.    MC/MEDQ  D:  01/03/2004  T:  01/04/2004  Job:  [id number]

## 2010-12-05 NOTE — Op Note (Signed)
[place]. [facility]  Patient:    Justin Porter, Justin Porter Visit Number: [id number] MRN: [id number]          Service Type: SUR Location: [id number] Attending Physician:  Cameron Proud Dictated by:   D. Karle Plumber, M.D. Admit Date:  09/27/2001   CC:         Tyson Dense, M.D.   Operative Report  PREOPERATIVE DIAGNOSIS:  New left upper lobe lesion.  POSTOPERATIVE DIAGNOSIS:  Adenocarcinoma, left upper lobe.  OPERATION PERFORMED:  Left video-assisted thoracic surgery, left mini-thoracotomy and left upper lobectomy.  SURGEON:  D. Karle Plumber, M.D.  FIRST ASSISTANT:  Dominica Severin, P.A.  ANESTHESIA:  General anesthesia.  DESCRIPTION OF PROCEDURE:  After general anesthesia, patient was turned to the left lateral thoracotomy position.  He was found to have a new left upper lobe lesion approximately 1 cm in size that had not been present on the previous x-ray.  Two trocar sites were made and two trocars were inserted and a 30 degree scope was inserted.  Exploration was carried out.  The lesion could not be seen for the scope but he had a lot of scarring in his apex.  A 3- to 4-cm incision was made over the fifth intercostal space.  Dissection was carried down, partially dividing the latissimus and reflecting the serratus anteriorly, and entered the fifth intercostal space.  A small Tuffier was inserted.  This was able then to palpate the lesion in the posterior segment of the right upper lobe.  The lesion was clamped with a Duval lung clamp and then resected with two applications of the E-Z 45 stapler.  The section was sent for frozen section and revealed adenocarcinoma.  The upper incision was then enlarged to approximately 6 cm.  The latissimus was divided more and the fifth intercostal space was entered.  Two Tuffiers were placed at right angles.  Dissection was started in the fissure, dissecting out the lingular branches.  They were  doubly ligated with 0 silk and divided and dissection was carried out, dividing the fissure with three applications of the E-Z 45.  At the fissure, some 11-L and 10-L nodes were dissected free, then dissection was carried up for the anterior branch, which was dissected out and stapled with the Autosuture vascular stapler, 30 Roticulator.  Then dissection was carried out on the apicoposterior branch and this was also resected with the Autosuture stapler and divided.  Finally, the superior pulmonary vein was dissected out and divided with the Autosuture stapler with two applications, first for one branch and then for the rest of the body of the remaining branch and then for the main branch.  Several more 10-L nodes were dissected free and one 11-L node was dissected free from around the bronchus.  Bronchus was then stapled with the TA-30 Ethicon stapler and divided distally.  Bronchial margin was negative on frozen section.  All bleeding was electrocoagulated. FocalSeal was applied to the staple lines using first the primer, then the sealant and then the ultraviolet wand for ______.  Two chest tubes were brought through the trocar sites and tied in place with 0 silk.  The chest was closed with two pericostals, #1 Vicryl in the muscle layer, 2-0 Vicryl in the subcutaneous tissue and Ethicon skin clips.  The patient returned to the recovery room in stable condition. Dictated by:   D. Karle Plumber, M.D. Attending Physician:  Cameron Proud DD:  09/27/01 TD:  09/28/01 Job: [id number]  [id number]

## 2010-12-05 NOTE — H&P (Signed)
[place]. [facility]  Patient:    Justin Porter, Justin Porter Visit Number: [id number] MRN: [id number]          Service Type: Attending:  D. Karle Plumber, M.D. Dictated by:   Adair Patter, P.A. Adm. Date:  09/27/01                           History and Physical  CHIEF COMPLAINT:  Left lung lesion.  HISTORY OF PRESENT ILLNESS:  This is a 77 year old male who is referred to Dr. Edwyna Shell by Dr. Eula Listen for evaluation of a left lung lesion.  Patient says he was having his routine annual physical exam which included chest x-ray that revealed he had a left-sided lung lesion.  Because of the chest x-ray findings, the patient had a CAT scan of the chest performed which confirmed this lesion.  The patient denies any symptoms.  He says he has no cough, sputum production, fever, chills, unexplained weight loss, or symptoms of gastroesophageal reflux disease.  He denies any shortness of breath, dyspnea on exertion, paroxysmal nocturnal dyspnea, history of angina, cardiac arrhythmias, hemoptysis, symptoms of transient ischemic attack or cerebrovascular accident, amaurosis fugax, pneumonia, pulmonary embolism, or DVT.  He does say he has a history of lower extremity peripheral edema for which he takes medication.  PAST MEDICAL HISTORY: 1. History of prostate cancer. 2. Allergic rhinitis. 3. Hypertension. 4. Osteoarthritis. 5. History of fractured clavicle.  PAST SURGICAL HISTORY: 1. Radical prostatectomy. 2. Hemorrhoidectomy. 3. Tonsillectomy. 4. Arthroscopic surgery.  ALLERGIES:  CODEINE and SHRIMP.  MEDICATIONS: 1. Lasix 20 mg p.o. b.i.d. 2. Potassium chloride 99 mg p.o. q.d. 3. Aspirin 81 mg p.o. q.d.  FAMILY HISTORY:  Patients mother and father are both deceased.  They both had coronary artery disease.  There is no family history of cancer, diabetes mellitus, or stroke.  SOCIAL HISTORY:  The patient is married, lives with his wife, has two children.  He  denies any alcohol use.  He says he has never smoked cigarettes.  REVIEW OF SYSTEMS:  GENERAL:  Patient denies any fever, chills, night sweats, or frequent illnesses.  HEAD:  Patient denies any head injuries or seizures. EYES:  Patient denies any glaucoma or cataracts but does wear corrective lenses.  EARS:  Patient denies any tinnitus, vertigo, hearing loss, or ear infections.  NOSE:  Patient denies any epistaxis, rhinitis, or sinusitis. MOUTH:  Patient denies any problems with dentition or frequent sore throats. NECK:  Patient denies any lumps, masses, or pain on range of motion of his neck.  CARDIOVASCULAR:  Patient denies any history of myocardial infarction, angina, or cardiac arrhythmias.  PULMONARY:  Patient denies any asthma, bronchitis, emphysema, or pneumonia.  GI:  Patient denies any nausea, vomiting, diarrhea, constipation, hematochezia, or melena.  GU:  Patient has a history of prostate cancer for which he was treated surgically.  He denies any urinary incontinence, impotence, or urinary tract infections.  ENDOCRINE: Patient denies any thyroid disease or diabetes mellitus.  MUSCULOSKELETAL: Patient has osteoarthritis for which he has no active treatment currently. Denies any other arthritis, arthralgias, or myalgias.  NEUROLOGIC:  Patient denies any stroke, transient ischemic attack, memory loss, or depression.  PHYSICAL EXAMINATION:  VITAL SIGNS:  Blood pressure 120/70, pulse 84 and regular, respirations 16.  GENERAL:  Patient is alert and oriented x 3 and is no distress.  HEENT:  Head:  Atraumatic, normocephalic.  Eyes:  Pupils equal, round, and reactive to light and  accommodation.  Extraocular motions are intact without scleral icterus or nystagmus.  Ears:  Auditory acuity is grossly intact. Nose:  Nasal patency intact.  Sinuses are nontender.  Mouth:  Moist without exudates.  NECK:  Supple without JVD, lymphadenopathy, carotid bruits, or  thyromegaly.  CARDIOVASCULAR:  Revealed a regular rate and rhythm without murmurs, gallops, or rubs.  LUNGS:  Bilaterally clear to auscultation without rales, rhonchi, or wheezes.  ABDOMEN:  Soft, nontender, nondistended, positive bowel sounds in all four quadrants.  EXTREMITIES:  Revealed no cyanosis, clubbing, or edema.  NEUROLOGIC:  Revealed no focal neurologic deficits.  Cranial nerves II-XII are grossly intact.  His gait was steady without the use of assistive devices.  He has 5+ and equal muscle strength in all four extremities.  IMPRESSION:  Left lung mass.  PLAN:  The patient will undergo a left video-assisted thoracoscopy with wedge resection of suspicious lesion and possible left lobectomy.Dictated by:   Adair Patter, P.A. Attending:  D. Karle Plumber, M.D. DD:  09/23/01 TD:  09/24/01 Job: [id number]

## 2011-04-16 LAB — POCT I-STAT, CHEM 8
Creatinine, Ser: 1
Hemoglobin: 13.6
Sodium: 138
TCO2: 19

## 2011-04-16 LAB — URINALYSIS, ROUTINE W REFLEX MICROSCOPIC
Bilirubin Urine: NEGATIVE
Ketones, ur: NEGATIVE
Nitrite: NEGATIVE
Urobilinogen, UA: 0.2

## 2011-04-16 LAB — BASIC METABOLIC PANEL
BUN: 12
Calcium: 8.9
Chloride: 105
Creatinine, Ser: 0.96
GFR calc non Af Amer: >60

## 2011-04-16 LAB — SAMPLE TO BLOOD BANK

## 2011-04-16 LAB — CBC
MCHC: 34.2
Platelets: 221
RDW: 13.1

## 2011-08-12 DIAGNOSIS — C61 Malignant neoplasm of prostate: Secondary | ICD-10-CM | POA: Diagnosis not present

## 2011-08-12 DIAGNOSIS — M1990 Unspecified osteoarthritis, unspecified site: Secondary | ICD-10-CM | POA: Diagnosis not present

## 2011-08-12 DIAGNOSIS — E782 Mixed hyperlipidemia: Secondary | ICD-10-CM | POA: Diagnosis not present

## 2011-08-12 DIAGNOSIS — I10 Essential (primary) hypertension: Secondary | ICD-10-CM | POA: Diagnosis not present

## 2011-08-26 DIAGNOSIS — M1710 Unilateral primary osteoarthritis, unspecified knee: Secondary | ICD-10-CM | POA: Diagnosis not present

## 2011-09-10 DIAGNOSIS — C61 Malignant neoplasm of prostate: Secondary | ICD-10-CM | POA: Diagnosis not present

## 2011-09-10 DIAGNOSIS — R109 Unspecified abdominal pain: Secondary | ICD-10-CM | POA: Diagnosis not present

## 2011-09-14 ENCOUNTER — Other Ambulatory Visit: Payer: Self-pay | Admitting: Internal Medicine

## 2011-09-14 DIAGNOSIS — R197 Diarrhea, unspecified: Secondary | ICD-10-CM | POA: Diagnosis not present

## 2011-09-14 DIAGNOSIS — R1030 Lower abdominal pain, unspecified: Secondary | ICD-10-CM

## 2011-09-14 DIAGNOSIS — K3189 Other diseases of stomach and duodenum: Secondary | ICD-10-CM | POA: Diagnosis not present

## 2011-09-14 DIAGNOSIS — R109 Unspecified abdominal pain: Secondary | ICD-10-CM | POA: Diagnosis not present

## 2011-09-14 DIAGNOSIS — R1013 Epigastric pain: Secondary | ICD-10-CM | POA: Diagnosis not present

## 2011-09-15 ENCOUNTER — Encounter (HOSPITAL_COMMUNITY): Payer: Self-pay | Admitting: *Deleted

## 2011-09-15 ENCOUNTER — Ambulatory Visit
Admission: RE | Admit: 2011-09-15 | Discharge: 2011-09-15 | Disposition: A | Discharge status: Home / Self Care | Payer: Medicare Other | Source: Ambulatory Visit | Attending: Internal Medicine | Admitting: Internal Medicine

## 2011-09-15 ENCOUNTER — Inpatient Hospital Stay (HOSPITAL_COMMUNITY)
Admission: EM | Admit: 2011-09-15 | Discharge: 2011-09-23 | DRG: 340 | Disposition: A | Discharge status: Home / Self Care | Payer: Medicare Other | Source: Ambulatory Visit | Attending: General Surgery | Admitting: General Surgery

## 2011-09-15 ENCOUNTER — Inpatient Hospital Stay
Admission: RE | Admit: 2011-09-15 | Discharge: 2011-09-15 | Payer: No Typology Code available for payment source | Source: Ambulatory Visit | Attending: Internal Medicine | Admitting: Internal Medicine

## 2011-09-15 DIAGNOSIS — R109 Unspecified abdominal pain: Secondary | ICD-10-CM | POA: Diagnosis not present

## 2011-09-15 DIAGNOSIS — M7989 Other specified soft tissue disorders: Secondary | ICD-10-CM | POA: Diagnosis not present

## 2011-09-15 DIAGNOSIS — I10 Essential (primary) hypertension: Secondary | ICD-10-CM | POA: Insufficient documentation

## 2011-09-15 DIAGNOSIS — Z9079 Acquired absence of other genital organ(s): Secondary | ICD-10-CM | POA: Diagnosis not present

## 2011-09-15 DIAGNOSIS — E785 Hyperlipidemia, unspecified: Secondary | ICD-10-CM | POA: Diagnosis present

## 2011-09-15 DIAGNOSIS — R141 Gas pain: Secondary | ICD-10-CM | POA: Diagnosis not present

## 2011-09-15 DIAGNOSIS — H9190 Unspecified hearing loss, unspecified ear: Secondary | ICD-10-CM | POA: Diagnosis present

## 2011-09-15 DIAGNOSIS — Z8546 Personal history of malignant neoplasm of prostate: Secondary | ICD-10-CM

## 2011-09-15 DIAGNOSIS — R197 Diarrhea, unspecified: Secondary | ICD-10-CM | POA: Diagnosis not present

## 2011-09-15 DIAGNOSIS — R609 Edema, unspecified: Secondary | ICD-10-CM | POA: Diagnosis not present

## 2011-09-15 DIAGNOSIS — K3580 Unspecified acute appendicitis: Secondary | ICD-10-CM | POA: Diagnosis not present

## 2011-09-15 DIAGNOSIS — L02219 Cutaneous abscess of trunk, unspecified: Secondary | ICD-10-CM | POA: Diagnosis not present

## 2011-09-15 DIAGNOSIS — K3533 Acute appendicitis with perforation and localized peritonitis, with abscess: Secondary | ICD-10-CM | POA: Diagnosis not present

## 2011-09-15 DIAGNOSIS — R142 Eructation: Secondary | ICD-10-CM | POA: Diagnosis not present

## 2011-09-15 DIAGNOSIS — N5089 Other specified disorders of the male genital organs: Secondary | ICD-10-CM | POA: Diagnosis not present

## 2011-09-15 DIAGNOSIS — K3532 Acute appendicitis with perforation and localized peritonitis, without abscess: Secondary | ICD-10-CM

## 2011-09-15 DIAGNOSIS — R1030 Lower abdominal pain, unspecified: Secondary | ICD-10-CM

## 2011-09-15 DIAGNOSIS — M1990 Unspecified osteoarthritis, unspecified site: Secondary | ICD-10-CM | POA: Diagnosis present

## 2011-09-15 DIAGNOSIS — K651 Peritoneal abscess: Secondary | ICD-10-CM | POA: Diagnosis not present

## 2011-09-15 DIAGNOSIS — IMO0002 Reserved for concepts with insufficient information to code with codable children: Secondary | ICD-10-CM

## 2011-09-15 DIAGNOSIS — L0291 Cutaneous abscess, unspecified: Secondary | ICD-10-CM | POA: Diagnosis not present

## 2011-09-15 DIAGNOSIS — Z85118 Personal history of other malignant neoplasm of bronchus and lung: Secondary | ICD-10-CM | POA: Insufficient documentation

## 2011-09-15 DIAGNOSIS — K352 Acute appendicitis with generalized peritonitis, without abscess: Secondary | ICD-10-CM | POA: Diagnosis not present

## 2011-09-15 LAB — DIFFERENTIAL
Eosinophils Absolute: 0.1 10*3/uL (ref 0.0–0.7)
Eosinophils Relative: 1 % (ref 0–5)
Monocytes Absolute: 1 10*3/uL (ref 0.1–1.0)
Neutrophils Relative %: 72 % (ref 43–77)

## 2011-09-15 LAB — COMPREHENSIVE METABOLIC PANEL
ALT: 75 U/L — ABNORMAL HIGH (ref 0–53)
AST: 51 U/L — ABNORMAL HIGH (ref 0–37)
Albumin: 2.8 g/dL — ABNORMAL LOW (ref 3.5–5.2)
CO2: 23 mEq/L (ref 19–32)
Calcium: 8.9 mg/dL (ref 8.4–10.5)
Sodium: 127 mEq/L — ABNORMAL LOW (ref 135–145)
Total Protein: 6.5 g/dL (ref 6.0–8.3)

## 2011-09-15 LAB — URINALYSIS, ROUTINE W REFLEX MICROSCOPIC
Glucose, UA: NEGATIVE mg/dL
Specific Gravity, Urine: <1.005 — ABNORMAL LOW (ref 1.005–1.030)
pH: 6 (ref 5.0–8.0)

## 2011-09-15 LAB — CBC
MCH: 32 pg (ref 26.0–34.0)
Platelets: 288 10*3/uL (ref 150–400)
RBC: 4.12 MIL/uL — ABNORMAL LOW (ref 4.22–5.81)

## 2011-09-15 LAB — URINE MICROSCOPIC-ADD ON

## 2011-09-15 MED ORDER — PIPERACILLIN-TAZOBACTAM 3.375 G IVPB
3.3750 g | Freq: Once | INTRAVENOUS | Status: AC
  Administered 2011-09-15: 3.375 g via INTRAVENOUS
  Filled 2011-09-15: qty 50

## 2011-09-15 MED ORDER — ONDANSETRON HCL 4 MG/2ML IJ SOLN: 4.0000 mg | Freq: Once | INTRAMUSCULAR | Status: DC

## 2011-09-15 MED ORDER — MORPHINE SULFATE 4 MG/ML IJ SOLN: 4.0000 mg | Freq: Once | INTRAMUSCULAR | Status: DC

## 2011-09-15 MED ORDER — IOHEXOL 300 MG/ML  SOLN
100.0000 mL | Freq: Once | INTRAMUSCULAR | Status: AC | PRN
  Administered 2011-09-15: 100 mL via INTRAVENOUS

## 2011-09-15 MED ORDER — SODIUM CHLORIDE 0.9 % IV SOLN
1000.0000 mL | INTRAVENOUS | Status: DC
  Administered 2011-09-15 – 2011-09-16 (×2): 1000 mL via INTRAVENOUS

## 2011-09-15 NOTE — ED Notes (Signed)
Dr Donell Beers at bedside updating and evaluating patient

## 2011-09-15 NOTE — ED Notes (Signed)
5037-01 Ready 

## 2011-09-15 NOTE — ED Notes (Signed)
Patient with reported onset of pain in his abd last week.  Patient had a CT done today and was found to have a ruptured appendix.  Patient has not been able to eat.  He has had diarrhea since Thursday.  Patient states he is sore

## 2011-09-15 NOTE — H&P (Signed)
Justin Porter is an 78 y.o. male.   Chief Complaint: Abdominal pain and diarrhea HPI:  Pt is an 78 year old male who presents with 7 days of abdominal pain.  It was worse last week for 3 days.  He was unable to lay down flat at that time.  It was worst in the RLQ then as well.  He also felt short of breath when he was trying to exercise.  He had 1x emesis last week.  Throughout this time he had anorexia.  He now has had several days of bloating and diarrhea.  His pain is now suprapubic in location in addition to the RLQ.  He denies fevers/ chills.  He has never felt pain like this before.  He attributes the pain to gas.    Past Medical History  Diagnosis Date  . Cancer     Past Surgical History  Procedure Date  . Elbow surgery   . Prostate surgery   . Lobectomy   . Back surgery   . Knee surgery     No family history on file. Social History:  reports that he has never smoked. He does not have any smokeless tobacco history on file. He reports that he does not drink alcohol or use illicit drugs.  Allergies:  Allergies  Allergen Reactions  . Codeine Nausea And Vomiting    Medications Prior to Admission  Medication Dose Route Frequency Provider Last Rate Last Dose  . 0.9 %  sodium chloride infusion  1,000 mL Intravenous Continuous Celene Kras, MD 125 mL/hr at 09/15/11 2022 1,000 mL at 09/15/11 2022  . iohexol (OMNIPAQUE) 300 MG/ML solution 100 mL  100 mL Intravenous Once PRN Medication Radiologist, MD   100 mL at 09/15/11 1750  . morphine 4 MG/ML injection 4 mg  4 mg Intravenous Once Celene Kras, MD      . ondansetron [facility]) injection 4 mg  4 mg Intravenous Once Celene Kras, MD      . piperacillin-tazobactam (ZOSYN) IVPB 3.375 g  3.375 g Intravenous Once Celene Kras, MD   3.375 g at 09/15/11 2022   No current outpatient prescriptions on file as of 09/15/2011.    Results for orders placed during the hospital encounter of 09/15/11 (from the past 48 hour(s))  CBC     Status:  Abnormal   Collection Time   09/15/11  7:57 PM      Component Value Range Comment   WBC 10.9 (*) 4.0 - 10.5 (K/uL)    RBC 4.12 (*) 4.22 - 5.81 (MIL/uL)    Hemoglobin 13.2  13.0 - 17.0 (g/dL)    HCT 16.1 (*) 09.6 - 52.0 (%)    MCV 92.5  78.0 - 100.0 (fL)    MCH 32.0  26.0 - 34.0 (pg)    MCHC 34.6  30.0 - 36.0 (g/dL)    RDW 04.5  40.9 - 81.1 (%)    Platelets 288  150 - 400 (K/uL)   DIFFERENTIAL     Status: Abnormal   Collection Time   09/15/11  7:57 PM      Component Value Range Comment   Neutrophils Relative 72  43 - 77 (%)    Lymphocytes Relative 17  12 - 46 (%)    Monocytes Relative 9  3 - 12 (%)    Eosinophils Relative 1  0 - 5 (%)    Basophils Relative 1  0 - 1 (%)    Neutro Abs 7.8 (*)  1.7 - 7.7 (K/uL)    Lymphs Abs 1.9  0.7 - 4.0 (K/uL)    Monocytes Absolute 1.0  0.1 - 1.0 (K/uL)    Eosinophils Absolute 0.1  0.0 - 0.7 (K/uL)    Basophils Absolute 0.1  0.0 - 0.1 (K/uL)    WBC Morphology TOXIC GRANULATION     COMPREHENSIVE METABOLIC PANEL     Status: Abnormal   Collection Time   09/15/11  7:57 PM      Component Value Range Comment   Sodium 127 (*) 135 - 145 (mEq/L)    Potassium 3.6  3.5 - 5.1 (mEq/L)    Chloride 91 (*) 96 - 112 (mEq/L)    CO2 23  19 - 32 (mEq/L)    Glucose, Bld 99  70 - 99 (mg/dL)    BUN 14  6 - 23 (mg/dL)    Creatinine, Ser 1.61  0.50 - 1.35 (mg/dL)    Calcium 8.9  8.4 - 10.5 (mg/dL)    Total Protein 6.5  6.0 - 8.3 (g/dL)    Albumin 2.8 (*) 3.5 - 5.2 (g/dL)    AST 51 (*) 0 - 37 (U/L)    ALT 75 (*) 0 - 53 (U/L)    Alkaline Phosphatase 91  39 - 117 (U/L)    Total Bilirubin 0.7  0.3 - 1.2 (mg/dL)    GFR calc non Af Amer 83 (*) >90 (mL/min)    GFR calc Af Amer >90  >90 (mL/min)   URINALYSIS, ROUTINE W REFLEX MICROSCOPIC     Status: Abnormal   Collection Time   09/15/11  8:08 PM      Component Value Range Comment   Color, Urine YELLOW  YELLOW     APPearance CLEAR  CLEAR     Specific Gravity, Urine <1.005 (*) 1.005 - 1.030  REPEATED TO VERIFY   pH 6.0   5.0 - 8.0     Glucose, UA NEGATIVE  NEGATIVE (mg/dL)    Hgb urine dipstick TRACE (*) NEGATIVE     Bilirubin Urine SMALL (*) NEGATIVE     Ketones, ur 40 (*) NEGATIVE (mg/dL)    Protein, ur 30 (*) NEGATIVE (mg/dL)    Urobilinogen, UA 1.0  0.0 - 1.0 (mg/dL)    Nitrite NEGATIVE  NEGATIVE     Leukocytes, UA NEGATIVE  NEGATIVE    URINE MICROSCOPIC-ADD ON     Status: Normal   Collection Time   09/15/11  8:08 PM      Component Value Range Comment   Squamous Epithelial / LPF RARE  RARE     RBC / HPF 0-2  <3 (RBC/hpf)    Ct Abdomen Pelvis W Contrast  09/15/2011  *RADIOLOGY REPORT*  Clinical Data: Lower abdominal pain.  Abdominal bloating.  Evaluate for hernia versus diverticulitis.  History of prostate and lung cancer.  CT ABDOMEN AND PELVIS WITH CONTRAST  Technique:  Multidetector CT imaging of the abdomen and pelvis was performed following the standard protocol during bolus administration of intravenous contrast.  Contrast:  100 ml Omnipaque-300  Comparison: CT chest, abdomen and pelvis - 11/26/2010  Findings:  Limited visualization of the lower thorax is negative for focal airspace opacity or pleural effusion.  Normal heart size.  No pericardial effusion.  Normal hepatic contour.  No discrete hepatic lesions.  Normal gallbladder.  No intra or extrahepatic biliary ductal dilatation. No ascites.  There is symmetric enhancement and excretion of the bilateral kidneys.  No discrete renal lesions.  No urinary obstruction.  The bilateral adrenal glands, pancreas and spleen are normal.  The appendix is not identified, rather, within the right lower quadrant is a complex peripherally enhancing abscess with possible appendicolith (axial image 68, series 3; coronal image 58, series 601).  This likely multiloculated abscess contains several foci of tear.  Though the abscess is irregular in shape it measures at least 3.2 x 6 cm in greatest oblique coronal dimension (image 58, series 601).  An additional peripherally  enhancing fluid collection is seen within the pelvis anterior to the rectum (image 75, series 3) measures approximately 3.1 x 7.6 cm.  Enteric contrast extends to the level of the distal sigmoid colon. There is mild dilatation of upstream loops of small bowel without definite evidence of obstruction.  No pneumatosis or portal venous gas.  Scattered atherosclerotic calcifications of a normal caliber abdominal aorta.  The major branch vessels of the abdominal aorta, including the IMA, are patent.  Shoddy retroperitoneal and mesenteric lymph nodes are not enlarged by CT criteria.  No pelvic or inguinal lymph adenopathy.  Post prostatectomy.  The urinary bladder is decompressed.  Post L3 - L4 paraspinal fusion and laminectomy.  No evidence of hardware failure or loosening.  No acute or aggressive osseous abnormalities.  IMPRESSION:  1.  Findings compatible with a perforated appendicitis with complex multiloculated abscess within the right lower quadrant which contains several foci of extraluminal air.  The abscess is irregular in shape but measures at least 6 cm in greatest diameter. There is an additional abscess within the pelvis, anterior to the rectum.  2.  Upstream dilatation of the small bowel without evidence of enteric obstruction. 3.  Post prostatectomy.  This was made a call report.  Original Report Authenticated By: Waynard Reeds, M.D.    Review of Systems  Constitutional: Positive for malaise/fatigue. Negative for fever, chills, weight loss and diaphoresis.  HENT: Positive for hearing loss.   Eyes: Negative.   Respiratory: Positive for shortness of breath (since last wednesday).   Cardiovascular: Negative.   Gastrointestinal: Positive for nausea, vomiting (1x last week), abdominal pain and diarrhea. Negative for constipation, blood in stool and melena.       Anorexia   Genitourinary: Positive for urgency.  Musculoskeletal: Positive for joint pain.  Skin: Negative.   Neurological: Negative.   Negative for weakness.  Endo/Heme/Allergies: Negative.   Psychiatric/Behavioral: Negative.     Blood pressure 154/87, pulse 97, temperature 97.8 F (36.6 C), temperature source Oral, resp. rate 18, SpO2 100.00%. Physical Exam  Constitutional: He is oriented to person, place, and time. He appears well-developed and well-nourished. No distress.  HENT:  Head: Normocephalic and atraumatic.  Mouth/Throat: No oropharyngeal exudate.  Eyes: Conjunctivae are normal. Pupils are equal, round, and reactive to light. No scleral icterus.  Neck: Normal range of motion. Neck supple. No thyromegaly present.  Cardiovascular: Normal rate, regular rhythm and intact distal pulses.  Exam reveals no gallop and no friction rub.   No murmur heard. Respiratory: Effort normal and breath sounds normal. No respiratory distress. He exhibits no tenderness.  GI: Soft. He exhibits distension. He exhibits no mass. There is tenderness (suprapubic and RLQ). There is no rebound and no guarding.  Musculoskeletal: Normal range of motion. He exhibits no edema and no tenderness.  Lymphadenopathy:    He has no cervical adenopathy.  Neurological: He is alert and oriented to person, place, and time. 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  Skin: Skin is warm and dry.  No rash noted. He is not diaphoretic. No erythema. No pallor.  Psychiatric: He has a normal mood and affect. His behavior is normal. Judgment and thought content normal.     Assessment/Plan Ruptured appendicitis with abscess  IVF IV antibiotics Clears until midnight, then NPO after midnight. Attempt perc drainage and non operative management. Will discuss with Dr. Gerrit Friends and the [facility] team in AM.       [facility] 09/15/2011, 10:03 PM

## 2011-09-15 NOTE — ED Provider Notes (Signed)
History     CSN: [id number]  Arrival date & time 09/15/11  6213   First MD Initiated Contact with Patient 09/15/11 1923      Chief Complaint  Patient presents with  . Abdominal Pain     HPI Patient presents to the emergency room with one week of abdominal pain. He saw his primary doctor yesterday and had an abdominal pelvic CT performed today the results of which showed that he has a perforated appendix with abscess. Patient was sent to the emergency room to see the general surgeon on-call. Patient states he's had diarrhea since Thursday. The pain was very severe initially last week. He was so severe he had to sleep upright in a chair. Symptoms however got better but never completely resolve. His appetite has not been as good. He has not had any fevers however. The pain increases with movement and palpation. At this time with only mild to moderate. Past Medical History  Diagnosis Date  . Cancer     Past Surgical History  Procedure Date  . Elbow surgery   . Prostate surgery   . Lobectomy   . Back surgery   . Knee surgery     No family history on file.  History  Substance Use Topics  . Smoking status: Never Smoker   . Smokeless tobacco: Not on file  . Alcohol Use: No      Review of Systems  All other systems reviewed and are negative.    Allergies  Review of patient's allergies indicates no known allergies.  Home Medications  No current outpatient prescriptions on file.  BP 154/87  Pulse 97  Temp(Src) 97.8 F (36.6 C) (Oral)  Resp 18  SpO2 100%  Physical Exam  Nursing note and vitals reviewed. Constitutional: He appears well-developed and well-nourished. No distress.  HENT:  Head: Normocephalic and atraumatic.  Right Ear: External ear normal.  Left Ear: External ear normal.  Eyes: Conjunctivae are normal. Right eye exhibits no discharge. Left eye exhibits no discharge. No scleral icterus.  Neck: Neck supple. No tracheal deviation present.    Cardiovascular: Normal rate, regular rhythm and intact distal pulses.   Pulmonary/Chest: Effort normal and breath sounds normal. No stridor. No respiratory distress. He has no wheezes. He has no rales.  Abdominal: Soft. Bowel sounds are normal. He exhibits no distension. There is tenderness in the right lower quadrant. There is no rigidity, no rebound and no guarding.  Musculoskeletal: He exhibits no edema and no tenderness.  Neurological: He is alert. He has normal strength. No sensory deficit. Cranial nerve deficit:  no gross defecits noted. He exhibits normal muscle tone. He displays no seizure activity. Coordination normal.  Skin: Skin is warm and dry. No rash noted.  Psychiatric: He has a normal mood and affect.    ED Course  Procedures (including critical care time)  Labs Reviewed - No data to display Ct Abdomen Pelvis W Contrast  09/15/2011  *RADIOLOGY REPORT*  Clinical Data: Lower abdominal pain.  Abdominal bloating.  Evaluate for hernia versus diverticulitis.  History of prostate and lung cancer.  CT ABDOMEN AND PELVIS WITH CONTRAST  Technique:  Multidetector CT imaging of the abdomen and pelvis was performed following the standard protocol during bolus administration of intravenous contrast.  Contrast:  100 ml Omnipaque-300  Comparison: CT chest, abdomen and pelvis - 11/26/2010  Findings:  Limited visualization of the lower thorax is negative for focal airspace opacity or pleural effusion.  Normal heart size.  No pericardial  effusion.  Normal hepatic contour.  No discrete hepatic lesions.  Normal gallbladder.  No intra or extrahepatic biliary ductal dilatation. No ascites.  There is symmetric enhancement and excretion of the bilateral kidneys.  No discrete renal lesions.  No urinary obstruction.  The bilateral adrenal glands, pancreas and spleen are normal.  The appendix is not identified, rather, within the right lower quadrant is a complex peripherally enhancing abscess with possible  appendicolith (axial image 68, series 3; coronal image 58, series 601).  This likely multiloculated abscess contains several foci of tear.  Though the abscess is irregular in shape it measures at least 3.2 x 6 cm in greatest oblique coronal dimension (image 58, series 601).  An additional peripherally enhancing fluid collection is seen within the pelvis anterior to the rectum (image 75, series 3) measures approximately 3.1 x 7.6 cm.  Enteric contrast extends to the level of the distal sigmoid colon. There is mild dilatation of upstream loops of small bowel without definite evidence of obstruction.  No pneumatosis or portal venous gas.  Scattered atherosclerotic calcifications of a normal caliber abdominal aorta.  The major branch vessels of the abdominal aorta, including the IMA, are patent.  Shoddy retroperitoneal and mesenteric lymph nodes are not enlarged by CT criteria.  No pelvic or inguinal lymph adenopathy.  Post prostatectomy.  The urinary bladder is decompressed.  Post L3 - L4 paraspinal fusion and laminectomy.  No evidence of hardware failure or loosening.  No acute or aggressive osseous abnormalities.  IMPRESSION:  1.  Findings compatible with a perforated appendicitis with complex multiloculated abscess within the right lower quadrant which contains several foci of extraluminal air.  The abscess is irregular in shape but measures at least 6 cm in greatest diameter. There is an additional abscess within the pelvis, anterior to the rectum.  2.  Upstream dilatation of the small bowel without evidence of enteric obstruction. 3.  Post prostatectomy.  This was made a call report.  Original Report Authenticated By: Waynard Reeds, M.D.       MDM  Patient has artery had a CT scan that shows a perforated appendix with a abscess in his abdomen. Dr. Donell Beers has been contacted. She will be comes to the patient emergency department. I will her labs and initiate IV antibiotics.       Celene Kras,  MD 09/15/11 [phone]

## 2011-09-15 NOTE — Progress Notes (Addendum)
ANTIBIOTIC CONSULT NOTE - INITIAL  Pharmacy Consult for Zozyn Indication: abdominal pain diarrhea  Allergies  Allergen Reactions  . Codeine Nausea And Vomiting    Patient Measurements:   Adjusted Body Weight: 184 lb  Vital Signs: Temp: 97.8 F (36.6 C) (02/26 1900) Temp src: Oral (02/26 1900) BP: 154/87 mmHg (02/26 1900) Pulse Rate: 97  (02/26 1900) Intake/Output from previous day:   Intake/Output from this shift:    Labs:  [facility] 09/15/11 1957  WBC 10.9*  HGB 13.2  PLT 288  LABCREA --  CREATININE 0.79   CrCl is unknown because there is no height on file for the current visit. No results found for this basename: VANCOTROUGH:2,VANCOPEAK:2,VANCORANDOM:2,GENTTROUGH:2,GENTPEAK:2,GENTRANDOM:2,TOBRATROUGH:2,TOBRAPEAK:2,TOBRARND:2,AMIKACINPEAK:2,AMIKACINTROU:2,AMIKACIN:2, in the last 72 hours   Microbiology: No results found for this or any previous visit (from the past 720 hour(s)).  Medical History: Past Medical History  Diagnosis Date  . Cancer     Medications:   (Not in a hospital admission) Assessment: 78 yo male admitted with abdominal pain and diarrhea ruptured appendix To be started on zosyn 3.375 iv q8h scr 0.79 mod renal function   Plan:  continue Zosyn 3.375 gm iv q8h and sign off on protocol  Lucille Passy 09/15/2011,10:15 PM

## 2011-09-16 ENCOUNTER — Inpatient Hospital Stay (HOSPITAL_COMMUNITY): Payer: Medicare Other

## 2011-09-16 MED ORDER — MORPHINE SULFATE 2 MG/ML IJ SOLN: 1.0000 mg | INTRAMUSCULAR | Status: DC | PRN

## 2011-09-16 MED ORDER — MIDAZOLAM HCL 2 MG/2ML IJ SOLN
INTRAMUSCULAR | Status: AC
  Filled 2011-09-16: qty 4

## 2011-09-16 MED ORDER — SIMVASTATIN 20 MG PO TABS
20.0000 mg | ORAL_TABLET | Freq: Every day | ORAL | Status: DC
  Administered 2011-09-16 – 2011-09-23 (×8): 20 mg via ORAL
  Filled 2011-09-16 (×8): qty 1

## 2011-09-16 MED ORDER — HYDROCODONE-ACETAMINOPHEN 5-325 MG PO TABS
1.0000 | ORAL_TABLET | ORAL | Status: DC | PRN
  Administered 2011-09-16: 2 via ORAL
  Administered 2011-09-16: 1 via ORAL
  Administered 2011-09-17 – 2011-09-20 (×6): 2 via ORAL
  Filled 2011-09-16 (×3): qty 2
  Filled 2011-09-16: qty 1
  Filled 2011-09-16 (×5): qty 2

## 2011-09-16 MED ORDER — DIPHENHYDRAMINE HCL 50 MG/ML IJ SOLN
12.5000 mg | Freq: Four times a day (QID) | INTRAMUSCULAR | Status: DC | PRN
  Administered 2011-09-19: 12.5 mg via INTRAVENOUS
  Filled 2011-09-16: qty 1

## 2011-09-16 MED ORDER — FENTANYL CITRATE 0.05 MG/ML IJ SOLN
INTRAMUSCULAR | Status: AC
  Filled 2011-09-16: qty 4

## 2011-09-16 MED ORDER — DIPHENHYDRAMINE HCL 12.5 MG/5ML PO ELIX: 12.5000 mg | ORAL_SOLUTION | Freq: Four times a day (QID) | ORAL | Status: DC | PRN

## 2011-09-16 MED ORDER — KCL IN DEXTROSE-NACL 20-5-0.45 MEQ/L-%-% IV SOLN
INTRAVENOUS | Status: DC
  Administered 2011-09-16: 20:00:00 via INTRAVENOUS
  Administered 2011-09-17: 1000 mL via INTRAVENOUS
  Administered 2011-09-17 – 2011-09-22 (×6): via INTRAVENOUS
  Filled 2011-09-16 (×14): qty 1000

## 2011-09-16 MED ORDER — ADULT MULTIVITAMIN W/MINERALS CH
1.0000 | ORAL_TABLET | Freq: Every day | ORAL | Status: DC
  Administered 2011-09-17 – 2011-09-23 (×7): 1 via ORAL
  Filled 2011-09-16 (×7): qty 1

## 2011-09-16 MED ORDER — ONDANSETRON HCL 4 MG/2ML IJ SOLN: 4.0000 mg | Freq: Four times a day (QID) | INTRAMUSCULAR | Status: DC | PRN

## 2011-09-16 MED ORDER — FENTANYL CITRATE 0.05 MG/ML IJ SOLN
INTRAMUSCULAR | Status: AC | PRN
  Administered 2011-09-16 (×2): 50 ug via INTRAVENOUS

## 2011-09-16 MED ORDER — ACETAMINOPHEN 325 MG PO TABS
650.0000 mg | ORAL_TABLET | Freq: Four times a day (QID) | ORAL | Status: DC | PRN
  Administered 2011-09-19 – 2011-09-20 (×2): 650 mg via ORAL
  Filled 2011-09-16 (×2): qty 2

## 2011-09-16 MED ORDER — PANTOPRAZOLE SODIUM 40 MG IV SOLR
40.0000 mg | Freq: Every day | INTRAVENOUS | Status: DC
  Administered 2011-09-16 – 2011-09-17 (×2): 40 mg via INTRAVENOUS
  Filled 2011-09-16 (×3): qty 40

## 2011-09-16 MED ORDER — ACETAMINOPHEN 650 MG RE SUPP: 650.0000 mg | Freq: Four times a day (QID) | RECTAL | Status: DC | PRN

## 2011-09-16 MED ORDER — MIDAZOLAM HCL 5 MG/5ML IJ SOLN
INTRAMUSCULAR | Status: AC | PRN
  Administered 2011-09-16 (×2): 1 mg via INTRAVENOUS

## 2011-09-16 MED ORDER — PIPERACILLIN-TAZOBACTAM 3.375 G IVPB
3.3750 g | Freq: Three times a day (TID) | INTRAVENOUS | Status: DC
  Administered 2011-09-16 – 2011-09-23 (×21): 3.375 g via INTRAVENOUS
  Filled 2011-09-16 (×24): qty 50

## 2011-09-16 MED ORDER — FUROSEMIDE 20 MG PO TABS
20.0000 mg | ORAL_TABLET | Freq: Two times a day (BID) | ORAL | Status: DC
  Administered 2011-09-16 – 2011-09-22 (×13): 20 mg via ORAL
  Filled 2011-09-16 (×17): qty 1

## 2011-09-16 NOTE — ED Notes (Signed)
O2 2L/[place] placed 

## 2011-09-16 NOTE — Procedures (Signed)
Successful CT fluoro guided 12 fr RLQ abscess drain 35 cc pus aspirated Sent for GS CX No comp Stable Full report in PACS

## 2011-09-16 NOTE — H&P (Signed)
Justin Porter is an 78 y.o. male.   Chief Complaint: Abd pain; diarrhea; ruptured appendix; abdominal collection on CT Scheduled for drain placement in IR HPI: prostate ca; lung ca; ruptured appendix  Past Medical History  Diagnosis Date  . Cancer     Past Surgical History  Procedure Date  . Elbow surgery   . Prostate surgery   . Lobectomy   . Back surgery   . Knee surgery     No family history on file. Social History:  reports that he has never smoked. He does not have any smokeless tobacco history on file. He reports that he does not drink alcohol or use illicit drugs.  Allergies:  Allergies  Allergen Reactions  . Codeine Nausea And Vomiting    Medications Prior to Admission  Medication Dose Route Frequency Provider Last Rate Last Dose  . 0.9 %  sodium chloride infusion  1,000 mL Intravenous Continuous Celene Kras, MD 125 mL/hr at 09/16/11 0744 1,000 mL at 09/16/11 0744  . iohexol (OMNIPAQUE) 300 MG/ML solution 100 mL  100 mL Intravenous Once PRN Medication Radiologist, MD   100 mL at 09/15/11 1750  . morphine 4 MG/ML injection 4 mg  4 mg Intravenous Once Celene Kras, MD      . ondansetron [facility]) injection 4 mg  4 mg Intravenous Once Celene Kras, MD      . piperacillin-tazobactam (ZOSYN) IVPB 3.375 g  3.375 g Intravenous Once Celene Kras, MD   3.375 g at 09/15/11 2022   No current outpatient prescriptions on file as of 09/16/2011.    Results for orders placed during the hospital encounter of 09/15/11 (from the past 48 hour(s))  CBC     Status: Abnormal   Collection Time   09/15/11  7:57 PM      Component Value Range Comment   WBC 10.9 (*) 4.0 - 10.5 (K/uL)    RBC 4.12 (*) 4.22 - 5.81 (MIL/uL)    Hemoglobin 13.2  13.0 - 17.0 (g/dL)    HCT 16.1 (*) 09.6 - 52.0 (%)    MCV 92.5  78.0 - 100.0 (fL)    MCH 32.0  26.0 - 34.0 (pg)    MCHC 34.6  30.0 - 36.0 (g/dL)    RDW 04.5  40.9 - 81.1 (%)    Platelets 288  150 - 400 (K/uL)   DIFFERENTIAL     Status: Abnormal   Collection Time   09/15/11  7:57 PM      Component Value Range Comment   Neutrophils Relative 72  43 - 77 (%)    Lymphocytes Relative 17  12 - 46 (%)    Monocytes Relative 9  3 - 12 (%)    Eosinophils Relative 1  0 - 5 (%)    Basophils Relative 1  0 - 1 (%)    Neutro Abs 7.8 (*) 1.7 - 7.7 (K/uL)    Lymphs Abs 1.9  0.7 - 4.0 (K/uL)    Monocytes Absolute 1.0  0.1 - 1.0 (K/uL)    Eosinophils Absolute 0.1  0.0 - 0.7 (K/uL)    Basophils Absolute 0.1  0.0 - 0.1 (K/uL)    WBC Morphology TOXIC GRANULATION     COMPREHENSIVE METABOLIC PANEL     Status: Abnormal   Collection Time   09/15/11  7:57 PM      Component Value Range Comment   Sodium 127 (*) 135 - 145 (mEq/L)    Potassium 3.6  3.5 -  5.1 (mEq/L)    Chloride 91 (*) 96 - 112 (mEq/L)    CO2 23  19 - 32 (mEq/L)    Glucose, Bld 99  70 - 99 (mg/dL)    BUN 14  6 - 23 (mg/dL)    Creatinine, Ser 5.36  0.50 - 1.35 (mg/dL)    Calcium 8.9  8.4 - 10.5 (mg/dL)    Total Protein 6.5  6.0 - 8.3 (g/dL)    Albumin 2.8 (*) 3.5 - 5.2 (g/dL)    AST 51 (*) 0 - 37 (U/L)    ALT 75 (*) 0 - 53 (U/L)    Alkaline Phosphatase 91  39 - 117 (U/L)    Total Bilirubin 0.7  0.3 - 1.2 (mg/dL)    GFR calc non Af Amer 83 (*) >90 (mL/min)    GFR calc Af Amer >90  >90 (mL/min)   URINALYSIS, ROUTINE W REFLEX MICROSCOPIC     Status: Abnormal   Collection Time   09/15/11  8:08 PM      Component Value Range Comment   Color, Urine YELLOW  YELLOW     APPearance CLEAR  CLEAR     Specific Gravity, Urine <1.005 (*) 1.005 - 1.030  REPEATED TO VERIFY   pH 6.0  5.0 - 8.0     Glucose, UA NEGATIVE  NEGATIVE (mg/dL)    Hgb urine dipstick TRACE (*) NEGATIVE     Bilirubin Urine SMALL (*) NEGATIVE     Ketones, ur 40 (*) NEGATIVE (mg/dL)    Protein, ur 30 (*) NEGATIVE (mg/dL)    Urobilinogen, UA 1.0  0.0 - 1.0 (mg/dL)    Nitrite NEGATIVE  NEGATIVE     Leukocytes, UA NEGATIVE  NEGATIVE    URINE MICROSCOPIC-ADD ON     Status: Normal   Collection Time   09/15/11  8:08 PM       Component Value Range Comment   Squamous Epithelial / LPF RARE  RARE     RBC / HPF 0-2  <3 (RBC/hpf)    Ct Abdomen Pelvis W Contrast  09/15/2011  *RADIOLOGY REPORT*  Clinical Data: Lower abdominal pain.  Abdominal bloating.  Evaluate for hernia versus diverticulitis.  History of prostate and lung cancer.  CT ABDOMEN AND PELVIS WITH CONTRAST  Technique:  Multidetector CT imaging of the abdomen and pelvis was performed following the standard protocol during bolus administration of intravenous contrast.  Contrast:  100 ml Omnipaque-300  Comparison: CT chest, abdomen and pelvis - 11/26/2010  Findings:  Limited visualization of the lower thorax is negative for focal airspace opacity or pleural effusion.  Normal heart size.  No pericardial effusion.  Normal hepatic contour.  No discrete hepatic lesions.  Normal gallbladder.  No intra or extrahepatic biliary ductal dilatation. No ascites.  There is symmetric enhancement and excretion of the bilateral kidneys.  No discrete renal lesions.  No urinary obstruction.  The bilateral adrenal glands, pancreas and spleen are normal.  The appendix is not identified, rather, within the right lower quadrant is a complex peripherally enhancing abscess with possible appendicolith (axial image 68, series 3; coronal image 58, series 601).  This likely multiloculated abscess contains several foci of tear.  Though the abscess is irregular in shape it measures at least 3.2 x 6 cm in greatest oblique coronal dimension (image 58, series 601).  An additional peripherally enhancing fluid collection is seen within the pelvis anterior to the rectum (image 75, series 3) measures approximately 3.1 x 7.6 cm.  Enteric contrast  extends to the level of the distal sigmoid colon. There is mild dilatation of upstream loops of small bowel without definite evidence of obstruction.  No pneumatosis or portal venous gas.  Scattered atherosclerotic calcifications of a normal caliber abdominal aorta.  The  major branch vessels of the abdominal aorta, including the IMA, are patent.  Shoddy retroperitoneal and mesenteric lymph nodes are not enlarged by CT criteria.  No pelvic or inguinal lymph adenopathy.  Post prostatectomy.  The urinary bladder is decompressed.  Post L3 - L4 paraspinal fusion and laminectomy.  No evidence of hardware failure or loosening.  No acute or aggressive osseous abnormalities.  IMPRESSION:  1.  Findings compatible with a perforated appendicitis with complex multiloculated abscess within the right lower quadrant which contains several foci of extraluminal air.  The abscess is irregular in shape but measures at least 6 cm in greatest diameter. There is an additional abscess within the pelvis, anterior to the rectum.  2.  Upstream dilatation of the small bowel without evidence of enteric obstruction. 3.  Post prostatectomy.  This was made a call report.  Original Report Authenticated By: Waynard Reeds, M.D.    Review of Systems  Constitutional: Negative for fever.  Cardiovascular: Negative for chest pain.  Gastrointestinal: Positive for abdominal pain and diarrhea.  Neurological: Negative for headaches.    Blood pressure 149/69, pulse 74, temperature 99.3 F (37.4 C), temperature source Oral, resp. rate 20, height 6' (1.829 m), weight 184 lb (83.462 kg), SpO2 95.00%. Physical Exam  Constitutional: He is oriented to person, place, and time.  Cardiovascular: Normal rate and normal heart sounds.   No murmur heard. Respiratory: Effort normal. He has no wheezes.  GI: Soft.  Neurological: He is alert and oriented to person, place, and time.     Assessment/Plan Ruptured appendix; fluid collection on CT Scheduled now for drain placement Pt and wife aware of procedure benefits and risks and agreeable to proceed. Consent signed and in chart.  Arsh Feutz A 09/16/2011, 9:42 AM

## 2011-09-16 NOTE — Progress Notes (Signed)
[facility] ([facility]) - ATTENDING: Patient up in chair visiting with family.  Percutaneous drain placed by IR.  Moderate output.  Mild pain now.  Will follow. Velora Heckler, MD, [facility] Office: [phone]

## 2011-09-16 NOTE — Progress Notes (Signed)
UR of chart complete.  

## 2011-09-16 NOTE — ED Notes (Signed)
O2 d/c'd 

## 2011-09-16 NOTE — Progress Notes (Signed)
Subjective: Alert, very HOH, he's not feeling to bad right now.  Major complaint is Diarrhea. His wife says he has not eaten in a week  Objective: Vital signs in last 24 hours: Temp:  [97.8 F (36.6 C)-99.3 F (37.4 C)] 99.3 F (37.4 C) (02/27 0547) Pulse Rate:  [74-97] 74  (02/27 0547) Resp:  [18-20] 20  (02/27 0547) BP: (149-156)/(69-87) 149/69 mmHg (02/27 0547) SpO2:  [95 %-100 %] 95 % (02/27 0547) Weight:  [83.462 kg (184 lb)] 83.462 kg (184 lb) (02/26 2224) Last BM Date: 09/15/11  Intake/Output from previous day:   Intake/Output this shift:    PE: Alert, HOH, No acute distress, Chest:  Clear. Abd:  Soft, sl tender, points to perineum and RLQ as area of pain. He is not distended. Lab Results:   [facility] 09/15/11 1957  WBC 10.9*  HGB 13.2  HCT 38.1*  PLT 288    Lab 09/15/11 1957  AST 51*  ALT 75*  ALKPHOS 91  BILITOT 0.7  PROT 6.5  ALBUMIN 2.8*    BMET  Basename 09/15/11 1957  NA 127*  K 3.6  CL 91*  CO2 23  GLUCOSE 99  BUN 14  CREATININE 0.79  CALCIUM 8.9   PT/INR No results found for this basename: LABPROT:2,INR:2 in the last 72 hours   Studies/Results: Ct Abdomen Pelvis W Contrast  09/15/2011  *RADIOLOGY REPORT*  Clinical Data: Lower abdominal pain.  Abdominal bloating.  Evaluate for hernia versus diverticulitis.  History of prostate and lung cancer.  CT ABDOMEN AND PELVIS WITH CONTRAST  Technique:  Multidetector CT imaging of the abdomen and pelvis was performed following the standard protocol during bolus administration of intravenous contrast.  Contrast:  100 ml Omnipaque-300  Comparison: CT chest, abdomen and pelvis - 11/26/2010  Findings:  Limited visualization of the lower thorax is negative for focal airspace opacity or pleural effusion.  Normal heart size.  No pericardial effusion.  Normal hepatic contour.  No discrete hepatic lesions.  Normal gallbladder.  No intra or extrahepatic biliary ductal dilatation. No ascites.  There is symmetric  enhancement and excretion of the bilateral kidneys.  No discrete renal lesions.  No urinary obstruction.  The bilateral adrenal glands, pancreas and spleen are normal.  The appendix is not identified, rather, within the right lower quadrant is a complex peripherally enhancing abscess with possible appendicolith (axial image 68, series 3; coronal image 58, series 601).  This likely multiloculated abscess contains several foci of tear.  Though the abscess is irregular in shape it measures at least 3.2 x 6 cm in greatest oblique coronal dimension (image 58, series 601).  An additional peripherally enhancing fluid collection is seen within the pelvis anterior to the rectum (image 75, series 3) measures approximately 3.1 x 7.6 cm.  Enteric contrast extends to the level of the distal sigmoid colon. There is mild dilatation of upstream loops of small bowel without definite evidence of obstruction.  No pneumatosis or portal venous gas.  Scattered atherosclerotic calcifications of a normal caliber abdominal aorta.  The major branch vessels of the abdominal aorta, including the IMA, are patent.  Shoddy retroperitoneal and mesenteric lymph nodes are not enlarged by CT criteria.  No pelvic or inguinal lymph adenopathy.  Post prostatectomy.  The urinary bladder is decompressed.  Post L3 - L4 paraspinal fusion and laminectomy.  No evidence of hardware failure or loosening.  No acute or aggressive osseous abnormalities.  IMPRESSION:  1.  Findings compatible with a perforated appendicitis with complex  multiloculated abscess within the right lower quadrant which contains several foci of extraluminal air.  The abscess is irregular in shape but measures at least 6 cm in greatest diameter. There is an additional abscess within the pelvis, anterior to the rectum.  2.  Upstream dilatation of the small bowel without evidence of enteric obstruction. 3.  Post prostatectomy.  This was made a call report.  Original Report Authenticated By:  Waynard Reeds, M.D.    Anti-infectives: Anti-infectives     Start     Dose/Rate Route Frequency Ordered Stop   09/15/11 1945  piperacillin-tazobactam (ZOSYN) IVPB 3.375 g       3.375 g 12.5 mL/hr over 240 Minutes Intravenous  Once 09/15/11 1940 09/16/11 0022         Current Facility-Administered Medications  Medication Dose Route Frequency Provider Last Rate Last Dose  . 0.9 %  sodium chloride infusion  1,000 mL Intravenous Continuous Celene Kras, MD 125 mL/hr at 09/15/11 2022 1,000 mL at 09/15/11 2022  . morphine 4 MG/ML injection 4 mg  4 mg Intravenous Once Celene Kras, MD      . ondansetron [facility]) injection 4 mg  4 mg Intravenous Once Celene Kras, MD      . piperacillin-tazobactam (ZOSYN) IVPB 3.375 g  3.375 g Intravenous Once Celene Kras, MD   3.375 g at 09/15/11 2022   Facility-Administered Medications Ordered in Other Encounters  Medication Dose Route Frequency Provider Last Rate Last Dose  . iohexol (OMNIPAQUE) 300 MG/ML solution 100 mL  100 mL Intravenous Once PRN Medication Radiologist, MD   100 mL at 09/15/11 1750    Assessment/Plan Perforated Appendix, with multiple abscess, RLQ & and anterior to rectum  History of Prostate CA radical prostatectomy and radiation Rx and Lung CA T1N0Mo s/p LVATS with LUL 09/2001. Osteoarthritis Hypertension Dyslipidemia HOH  PLAN:  We have requested IR to evaluate, and will decide on plan after they see him.   LOS: 1 day    Justin Porter 09/16/2011

## 2011-09-17 LAB — CBC
HCT: 35.6 % — ABNORMAL LOW (ref 39.0–52.0)
MCHC: 34.2 g/dL (ref 30.0–36.0)
MCHC: 34.8 g/dL (ref 30.0–36.0)
MCV: 92.7 fL (ref 78.0–100.0)
Platelets: 312 10*3/uL (ref 150–400)
RDW: 13.7 % (ref 11.5–15.5)
RDW: 13.5 % (ref 11.5–15.5)

## 2011-09-17 LAB — BASIC METABOLIC PANEL
BUN: 9 mg/dL (ref 6–23)
BUN: 9 mg/dL (ref 6–23)
Creatinine, Ser: 0.91 mg/dL (ref 0.50–1.35)
Creatinine, Ser: 0.92 mg/dL (ref 0.50–1.35)
GFR calc Af Amer: 90 mL/min — ABNORMAL LOW (ref >90)
GFR calc Af Amer: >90 mL/min (ref >90)
GFR calc non Af Amer: 78 mL/min — ABNORMAL LOW (ref >90)
GFR calc non Af Amer: 78 mL/min — ABNORMAL LOW (ref >90)
Potassium: 3.5 mEq/L (ref 3.5–5.1)

## 2011-09-17 MED ORDER — HEPARIN SODIUM (PORCINE) 5000 UNIT/ML IJ SOLN
5000.0000 [IU] | Freq: Three times a day (TID) | INTRAMUSCULAR | Status: DC
  Administered 2011-09-17 – 2011-09-23 (×17): 5000 [IU] via SUBCUTANEOUS
  Filled 2011-09-17 (×21): qty 1

## 2011-09-17 NOTE — Progress Notes (Signed)
Subjective: TM 99.3  VSS, 60 ml thru drain, WBC is up some.  He feels much better.  Still tender " When you Push" Wants to walk in halls Objective: Vital signs in last 24 hours: Temp:  [97.8 F (36.6 C)-98.9 F (37.2 C)] 98.6 F (37 C) (02/28 0521) Pulse Rate:  [85-96] 87  (02/28 0521) Resp:  [18-28] 18  (02/28 0521) BP: (141-169)/(63-95) 147/75 mmHg (02/28 0521) SpO2:  [97 %-100 %] 97 % (02/28 0521) Last BM Date: 09/16/11  Intake/Output from previous day: 02/27 0701 - 02/28 0700 In: 960 [P.O.:960] Out: 60 [Drains:60] Intake/Output this shift:    PE:  Alert up in chair taking clears.  Chest: Clear  Abd: Drain RLQ, sl tender + BS, no distension. +flatus no more diarrhea. Lab Results:   [facility] 09/17/11 0636 09/15/11 1957  WBC 13.4*13.4* 10.9*  HGB 12.4*12.4* 13.2  HCT 36.3*35.6* 38.1*  PLT 161096 288    BMET  Basename 09/17/11 0636 09/15/11 1957  NA 132*132* 127*  K 3.53.6 3.6  CL 9697 91*  CO2 2727 23  GLUCOSE 134*131* 99  BUN 99 14  CREATININE 0.920.91 0.79  CALCIUM 8.2*8.3* 8.9   PT/INR No results found for this basename: LABPROT:2,INR:2 in the last 72 hours   Studies/Results: Ct Guided Abscess Drain  09/16/2011  *RADIOLOGY REPORT*  Clinical Data: Ruptured appendicitis, appendiceal abscess  CT FLUOROSCOPY GUIDED RIGHT LOWER QUADRANT APPENDICEAL ABSCESS DRAINAGE  Date:  09/16/2011 10:35:00  Radiologist:  Judie Petit. Ruel Favors, M.D.  Medications:  2 mg Versed, 50 mcg Fentanyl  Guidance:  CT fluoroscopy  Fluoroscopy time:  4 seconds  Sedation time:  20 minutes  Contrast volume:  None.  Complications:  No immediate  PROCEDURE/FINDINGS:  Informed consent was obtained from the patient following explanation of the procedure, risks, benefits and alternatives. The patient understands, agrees and consents for the procedure. All questions were addressed.  A time out was performed.  Maximal barrier sterile technique utilized including caps, mask, sterile gowns,  sterile gloves, large sterile drape, hand hygiene, and betadine  Previous imaging was reviewed.  The patient was positioned supine. Noncontrast localization CT performed through the lower abdomen. The small periappendiceal abscess was localized.  Under sterile conditions and local anesthesia, an 18 gauge 10 cm access needle was advanced from an anterior oblique approach into the small abscess.  Needle position confirmed with CT fluoroscopy.  Syringe aspiration yielded purulent fluid.  Gram stain and cultures sent. Guide wire advanced followed by tract dilatation to insert 12- [place] drain.  Retention loop formed in the abscess cavity. Position reconfirmed with CT fluoroscopy.  Catheter secured with a Prolene suture and connected to external suction bulb.  Sterile dressing applied.  No immediate complication.  The patient tolerated the procedure well  IMPRESSION: Successful CT fluoroscopy guided right lower quadrant appendiceal abscess drainage.  Original Report Authenticated By: Judie Petit. Ruel Favors, M.D.   Ct Abdomen Pelvis W Contrast  09/15/2011  *RADIOLOGY REPORT*  Clinical Data: Lower abdominal pain.  Abdominal bloating.  Evaluate for hernia versus diverticulitis.  History of prostate and lung cancer.  CT ABDOMEN AND PELVIS WITH CONTRAST  Technique:  Multidetector CT imaging of the abdomen and pelvis was performed following the standard protocol during bolus administration of intravenous contrast.  Contrast:  100 ml Omnipaque-300  Comparison: CT chest, abdomen and pelvis - 11/26/2010  Findings:  Limited visualization of the lower thorax is negative for focal airspace opacity or pleural effusion.  Normal heart size.  No pericardial effusion.  Normal hepatic contour.  No discrete hepatic lesions.  Normal gallbladder.  No intra or extrahepatic biliary ductal dilatation. No ascites.  There is symmetric enhancement and excretion of the bilateral kidneys.  No discrete renal lesions.  No urinary obstruction.  The  bilateral adrenal glands, pancreas and spleen are normal.  The appendix is not identified, rather, within the right lower quadrant is a complex peripherally enhancing abscess with possible appendicolith (axial image 68, series 3; coronal image 58, series 601).  This likely multiloculated abscess contains several foci of tear.  Though the abscess is irregular in shape it measures at least 3.2 x 6 cm in greatest oblique coronal dimension (image 58, series 601).  An additional peripherally enhancing fluid collection is seen within the pelvis anterior to the rectum (image 75, series 3) measures approximately 3.1 x 7.6 cm.  Enteric contrast extends to the level of the distal sigmoid colon. There is mild dilatation of upstream loops of small bowel without definite evidence of obstruction.  No pneumatosis or portal venous gas.  Scattered atherosclerotic calcifications of a normal caliber abdominal aorta.  The major branch vessels of the abdominal aorta, including the IMA, are patent.  Shoddy retroperitoneal and mesenteric lymph nodes are not enlarged by CT criteria.  No pelvic or inguinal lymph adenopathy.  Post prostatectomy.  The urinary bladder is decompressed.  Post L3 - L4 paraspinal fusion and laminectomy.  No evidence of hardware failure or loosening.  No acute or aggressive osseous abnormalities.  IMPRESSION:  1.  Findings compatible with a perforated appendicitis with complex multiloculated abscess within the right lower quadrant which contains several foci of extraluminal air.  The abscess is irregular in shape but measures at least 6 cm in greatest diameter. There is an additional abscess within the pelvis, anterior to the rectum.  2.  Upstream dilatation of the small bowel without evidence of enteric obstruction. 3.  Post prostatectomy.  This was made a call report.  Original Report Authenticated By: Waynard Reeds, M.D.    Anti-infectives: Anti-infectives     Start     Dose/Rate Route Frequency Ordered  Stop   09/16/11 1151  piperacillin-tazobactam (ZOSYN) IVPB 3.375 g       3.375 g 12.5 mL/hr over 240 Minutes Intravenous 3 times per day 09/16/11 1151     09/15/11 1945  piperacillin-tazobactam (ZOSYN) IVPB 3.375 g       3.375 g 12.5 mL/hr over 240 Minutes Intravenous  Once 09/15/11 1940 09/16/11 0022         Current Facility-Administered Medications  Medication Dose Route Frequency Provider Last Rate Last Dose  . acetaminophen (TYLENOL) tablet 650 mg  650 mg Oral Q6H PRN Almond Lint, MD       Or  . acetaminophen (TYLENOL) suppository 650 mg  650 mg Rectal Q6H PRN Almond Lint, MD      . dextrose 5 % and 0.45 % NaCl with KCl 20 mEq/L infusion   Intravenous Continuous Almond Lint, MD 100 mL/hr at 09/17/11 0601    . diphenhydrAMINE (BENADRYL) injection 12.5 mg  12.5 mg Intravenous Q6H PRN Almond Lint, MD       Or  . diphenhydrAMINE (BENADRYL) 12.5 MG/5ML elixir 12.5 mg  12.5 mg Oral Q6H PRN Almond Lint, MD      . fentaNYL (SUBLIMAZE) 0.05 MG/ML injection           . fentaNYL (SUBLIMAZE) injection   Intravenous PRN Casimiro Needle T. Shick, MD   50 mcg at 09/16/11 1105  .  furosemide (LASIX) tablet 20 mg  20 mg Oral BID Almond Lint, MD   20 mg at 09/16/11 1714  . HYDROcodone-acetaminophen (NORCO) 5-325 MG per tablet 1-2 tablet  1-2 tablet Oral Q4H PRN Almond Lint, MD   2 tablet at 09/16/11 2225  . midazolam (VERSED) 2 MG/2ML injection           . midazolam (VERSED) 5 MG/5ML injection   Intravenous PRN Michael T. Shick, MD   1 mg at 09/16/11 1105  . morphine 2 MG/ML injection 1-2 mg  1-2 mg Intravenous Q2H PRN Almond Lint, MD      . mulitivitamin with minerals tablet 1 tablet  1 tablet Oral Daily Almond Lint, MD      . ondansetron (ZOFRAN) injection 4 mg  4 mg Intravenous Q6H PRN Almond Lint, MD      . pantoprazole (PROTONIX) injection 40 mg  40 mg Intravenous QHS Almond Lint, MD   40 mg at 09/16/11 2200  . piperacillin-tazobactam (ZOSYN) IVPB 3.375 g  3.375 g Intravenous Q8H Almond Lint, MD   3.375 g at 09/17/11 0601  . simvastatin (ZOCOR) tablet 20 mg  20 mg Oral q1800 Almond Lint, MD   20 mg at 09/16/11 1715  . DISCONTD: 0.9 %  sodium chloride infusion  1,000 mL Intravenous Continuous Celene Kras, MD 125 mL/hr at 09/16/11 0744 1,000 mL at 09/16/11 0744  . DISCONTD: morphine 4 MG/ML injection 4 mg  4 mg Intravenous Once Celene Kras, MD      . DISCONTD: ondansetron [facility]) injection 4 mg  4 mg Intravenous Once Celene Kras, MD        Assessment/Plan Perforated Appendix, with multiple abscess, RLQ & and anterior to rectum  History of Prostate CA radical prostatectomy and radiation Rx and Lung CA T1N0Mo s/p LVATS with LUL 09/2001.  Osteoarthritis  Hypertension  Dyslipidemia  HOH    Plan:   continue antibiotics, PPI for indigestion, Heparin for DVT , ambulate with assistance.     LOS: 2 days    Sudiksha Victor 09/17/2011

## 2011-09-17 NOTE — Progress Notes (Signed)
Subjective: RLQ abscess drain placed 2/27 Feels better today Slept well  Objective: Vital signs in last 24 hours: Temp:  [97.8 F (36.6 C)-98.9 F (37.2 C)] 98.6 F (37 C) (02/28 0521) Pulse Rate:  [85-96] 87  (02/28 0521) Resp:  [18-28] 18  (02/28 0521) BP: (141-169)/(63-95) 147/75 mmHg (02/28 0521) SpO2:  [97 %-100 %] 97 % (02/28 0521) Last BM Date: 09/16/11  Intake/Output from previous day: 02/27 0701 - 02/28 0700 In: 960 [P.O.:960] Out: 60 [Drains:60] Intake/Output this shift:    PE:  Afeb; VSS 60 cc from drain 2/27 JP has 10 cc now: chalky brown color cxs pending Site clean and dry; NT  Lab Results:   [facility] 09/17/11 0636 09/15/11 1957  WBC 13.4*13.4* 10.9*  HGB 12.4*12.4* 13.2  HCT 36.3*35.6* 38.1*  PLT 312323 288   BMET  Basename 09/17/11 0636 09/15/11 1957  NA 132*132* 127*  K 3.53.6 3.6  CL 9697 91*  CO2 2727 23  GLUCOSE 134*131* 99  BUN 99 14  CREATININE 0.920.91 0.79  CALCIUM 8.2*8.3* 8.9   PT/INR No results found for this basename: LABPROT:2,INR:2 in the last 72 hours ABG No results found for this basename: PHART:2,PCO2:2,PO2:2,HCO3:2 in the last 72 hours  Studies/Results: Ct Guided Abscess Drain  09/16/2011  *RADIOLOGY REPORT*  Clinical Data: Ruptured appendicitis, appendiceal abscess  CT FLUOROSCOPY GUIDED RIGHT LOWER QUADRANT APPENDICEAL ABSCESS DRAINAGE  Date:  09/16/2011 10:35:00  Radiologist:  Judie Petit. Ruel Favors, M.D.  Medications:  2 mg Versed, 50 mcg Fentanyl  Guidance:  CT fluoroscopy  Fluoroscopy time:  4 seconds  Sedation time:  20 minutes  Contrast volume:  None.  Complications:  No immediate  PROCEDURE/FINDINGS:  Informed consent was obtained from the patient following explanation of the procedure, risks, benefits and alternatives. The patient understands, agrees and consents for the procedure. All questions were addressed.  A time out was performed.  Maximal barrier sterile technique utilized including caps, mask,  sterile gowns, sterile gloves, large sterile drape, hand hygiene, and betadine  Previous imaging was reviewed.  The patient was positioned supine. Noncontrast localization CT performed through the lower abdomen. The small periappendiceal abscess was localized.  Under sterile conditions and local anesthesia, an 18 gauge 10 cm access needle was advanced from an anterior oblique approach into the small abscess.  Needle position confirmed with CT fluoroscopy.  Syringe aspiration yielded purulent fluid.  Gram stain and cultures sent. Guide wire advanced followed by tract dilatation to insert 12- [place] drain.  Retention loop formed in the abscess cavity. Position reconfirmed with CT fluoroscopy.  Catheter secured with a Prolene suture and connected to external suction bulb.  Sterile dressing applied.  No immediate complication.  The patient tolerated the procedure well  IMPRESSION: Successful CT fluoroscopy guided right lower quadrant appendiceal abscess drainage.  Original Report Authenticated By: Judie Petit. Ruel Favors, M.D.   Ct Abdomen Pelvis W Contrast  09/15/2011  *RADIOLOGY REPORT*  Clinical Data: Lower abdominal pain.  Abdominal bloating.  Evaluate for hernia versus diverticulitis.  History of prostate and lung cancer.  CT ABDOMEN AND PELVIS WITH CONTRAST  Technique:  Multidetector CT imaging of the abdomen and pelvis was performed following the standard protocol during bolus administration of intravenous contrast.  Contrast:  100 ml Omnipaque-300  Comparison: CT chest, abdomen and pelvis - 11/26/2010  Findings:  Limited visualization of the lower thorax is negative for focal airspace opacity or pleural effusion.  Normal heart size.  No pericardial effusion.  Normal hepatic contour.  No discrete hepatic  lesions.  Normal gallbladder.  No intra or extrahepatic biliary ductal dilatation. No ascites.  There is symmetric enhancement and excretion of the bilateral kidneys.  No discrete renal lesions.  No urinary  obstruction.  The bilateral adrenal glands, pancreas and spleen are normal.  The appendix is not identified, rather, within the right lower quadrant is a complex peripherally enhancing abscess with possible appendicolith (axial image 68, series 3; coronal image 58, series 601).  This likely multiloculated abscess contains several foci of tear.  Though the abscess is irregular in shape it measures at least 3.2 x 6 cm in greatest oblique coronal dimension (image 58, series 601).  An additional peripherally enhancing fluid collection is seen within the pelvis anterior to the rectum (image 75, series 3) measures approximately 3.1 x 7.6 cm.  Enteric contrast extends to the level of the distal sigmoid colon. There is mild dilatation of upstream loops of small bowel without definite evidence of obstruction.  No pneumatosis or portal venous gas.  Scattered atherosclerotic calcifications of a normal caliber abdominal aorta.  The major branch vessels of the abdominal aorta, including the IMA, are patent.  Shoddy retroperitoneal and mesenteric lymph nodes are not enlarged by CT criteria.  No pelvic or inguinal lymph adenopathy.  Post prostatectomy.  The urinary bladder is decompressed.  Post L3 - L4 paraspinal fusion and laminectomy.  No evidence of hardware failure or loosening.  No acute or aggressive osseous abnormalities.  IMPRESSION:  1.  Findings compatible with a perforated appendicitis with complex multiloculated abscess within the right lower quadrant which contains several foci of extraluminal air.  The abscess is irregular in shape but measures at least 6 cm in greatest diameter. There is an additional abscess within the pelvis, anterior to the rectum.  2.  Upstream dilatation of the small bowel without evidence of enteric obstruction. 3.  Post prostatectomy.  This was made a call report.  Original Report Authenticated By: Waynard Reeds, M.D.    Anti-infectives: Anti-infectives     Start     Dose/Rate Route  Frequency Ordered Stop   09/16/11 1151   piperacillin-tazobactam (ZOSYN) IVPB 3.375 g        3.375 g 12.5 mL/hr over 240 Minutes Intravenous 3 times per day 09/16/11 1151     09/15/11 1945   piperacillin-tazobactam (ZOSYN) IVPB 3.375 g        3.375 g 12.5 mL/hr over 240 Minutes Intravenous  Once 09/15/11 1940 09/16/11 0022          Assessment/Plan: S/p RLQ drain placed 2/27  Output good; pt feels better Will follow Plan per [facility]     Nadalie Laughner A 09/17/2011

## 2011-09-17 NOTE — Progress Notes (Signed)
[facility] [place] [facility] ([facility]) - ATTENDING: Patient seen and examined.  Agree with above.  Follow up CT abdomen per IR - monitor drain output and WBC level.  Will follow closely. Velora Heckler, MD, [facility] Office: [phone]

## 2011-09-18 MED ORDER — PANTOPRAZOLE SODIUM 40 MG PO TBEC
40.0000 mg | DELAYED_RELEASE_TABLET | Freq: Every day | ORAL | Status: DC
  Administered 2011-09-18 – 2011-09-22 (×4): 40 mg via ORAL
  Filled 2011-09-18 (×5): qty 1

## 2011-09-18 MED ORDER — METRONIDAZOLE IN NACL 5-0.79 MG/ML-% IV SOLN
500.0000 mg | Freq: Four times a day (QID) | INTRAVENOUS | Status: DC
  Administered 2011-09-18 – 2011-09-21 (×12): 500 mg via INTRAVENOUS
  Filled 2011-09-18 (×14): qty 100

## 2011-09-18 NOTE — Progress Notes (Signed)
[facility] [place] [facility] ([facility]) - ATTENDING: Cdiff pending.  Overall improvement.  Follow drain output.  Continue abx. Velora Heckler, MD, [facility] Office: [phone]

## 2011-09-18 NOTE — Progress Notes (Signed)
Patient ID: GAURAV BALDREE, male   DOB: [birth year], 78 y.o.   MRN: [id number]    Subjective: Afebrile for last 24 hours. Drain OP Continues to have liquid yellowish stools Objective: Vital signs in last 24 hours: Temp:  [97.3 F (36.3 C)-97.6 F (36.4 C)] 97.3 F (36.3 C) (03/01 0516) Pulse Rate:  [72-101] 101  (03/01 0516) Resp:  [18] 18  (03/01 0516) BP: (114-132)/(63-69) 121/68 mmHg (03/01 0516) SpO2:  [95 %-100 %] 100 % (03/01 0516) Last BM Date: 09/18/11  Intake/Output from previous day: 02/28 0701 - 03/01 0700 In: 2960 [P.O.:720; I.V.:2230] Out: 40 [Drains:40] Intake/Output this shift: Total I/O In: 360 [P.O.:360] Out: -   PE:  Alert up in chair taking clears.  Chest: Clear  Abd: Drain RLQ, sl tender + BS, no distension. +flatus no more diarrhea. Lab Results:   [facility] 09/17/11 0636 09/15/11 1957  WBC 13.4*13.4* 10.9*  HGB 12.4*12.4* 13.2  HCT 36.3*35.6* 38.1*  PLT 621308 288    BMET  Basename 09/17/11 0636 09/15/11 1957  NA 132*132* 127*  K 3.53.6 3.6  CL 9697 91*  CO2 2727 23  GLUCOSE 134*131* 99  BUN 99 14  CREATININE 0.920.91 0.79  CALCIUM 8.2*8.3* 8.9   PT/INR No results found for this basename: LABPROT:2,INR:2 in the last 72 hours   Studies/Results: Ct Guided Abscess Drain  09/16/2011  *RADIOLOGY REPORT*  Clinical Data: Ruptured appendicitis, appendiceal abscess  CT FLUOROSCOPY GUIDED RIGHT LOWER QUADRANT APPENDICEAL ABSCESS DRAINAGE  Date:  09/16/2011 10:35:00  Radiologist:  Judie Petit. Ruel Favors, M.D.  Medications:  2 mg Versed, 50 mcg Fentanyl  Guidance:  CT fluoroscopy  Fluoroscopy time:  4 seconds  Sedation time:  20 minutes  Contrast volume:  None.  Complications:  No immediate  PROCEDURE/FINDINGS:  Informed consent was obtained from the patient following explanation of the procedure, risks, benefits and alternatives. The patient understands, agrees and consents for the procedure. All questions were addressed.  A time out was  performed.  Maximal barrier sterile technique utilized including caps, mask, sterile gowns, sterile gloves, large sterile drape, hand hygiene, and betadine  Previous imaging was reviewed.  The patient was positioned supine. Noncontrast localization CT performed through the lower abdomen. The small periappendiceal abscess was localized.  Under sterile conditions and local anesthesia, an 18 gauge 10 cm access needle was advanced from an anterior oblique approach into the small abscess.  Needle position confirmed with CT fluoroscopy.  Syringe aspiration yielded purulent fluid.  Gram stain and cultures sent. Guide wire advanced followed by tract dilatation to insert 12- [place] drain.  Retention loop formed in the abscess cavity. Position reconfirmed with CT fluoroscopy.  Catheter secured with a Prolene suture and connected to external suction bulb.  Sterile dressing applied.  No immediate complication.  The patient tolerated the procedure well  IMPRESSION: Successful CT fluoroscopy guided right lower quadrant appendiceal abscess drainage.  Original Report Authenticated By: Judie Petit. Ruel Favors, M.D.    Anti-infectives: Anti-infectives     Start     Dose/Rate Route Frequency Ordered Stop   09/16/11 1151   piperacillin-tazobactam (ZOSYN) IVPB 3.375 g        3.375 g 12.5 mL/hr over 240 Minutes Intravenous 3 times per day 09/16/11 1151     09/15/11 1945   piperacillin-tazobactam (ZOSYN) IVPB 3.375 g        3.375 g 12.5 mL/hr over 240 Minutes Intravenous  Once 09/15/11 1940 09/16/11 0022  Assessment/Plan Perforated Appendix, with multiple abscess, RLQ & and anterior to rectum -S/P perc drain History of Prostate CA radical prostatectomy and radiation Rx and Lung CA T1N0Mo s/p LVATS with LUL 09/2001.  Osteoarthritis  Hypertension  Dyslipidemia  HOH    Plan: Continue drain and IV antibiotics, WBC's slightly up and still having diarrheal stools, so will also check C diff and add Flagyl for empiric  C diff coverage until results known.  Advance diet if C diff negative     LOS: 3 days    Clemons Salvucci,PA-C Pager [phone] General Trauma Pager [phone]

## 2011-09-18 NOTE — Progress Notes (Signed)
  Subjective: Patient doing a little better; still with loose stools, some RLQ discomfort; also notes some scrotal edema  Objective: Vital signs in last 24 hours: Temp:  [97.3 F (36.3 C)-97.6 F (36.4 C)] 97.3 F (36.3 C) (03/01 0516) Pulse Rate:  [72-101] 101  (03/01 0516) Resp:  [18] 18  (03/01 0516) BP: (114-121)/(63-68) 121/68 mmHg (03/01 0516) SpO2:  [95 %-100 %] 100 % (03/01 0516) Last BM Date: 09/18/11  Intake/Output from previous day: 02/28 0701 - 03/01 0700 In: 2960 [P.O.:720; I.V.:2230] Out: 40 [Drains:40] Intake/Output this shift: Total I/O In: 360 [P.O.:360] Out: -   RLQ drain intact, insertion site ok, mildly tender, output about 40-50 cc's today, cx's pending, cdiff neg.  Lab Results:   [facility] 09/17/11 0636 09/15/11 1957  WBC 13.4*13.4* 10.9*  HGB 12.4*12.4* 13.2  HCT 36.3*35.6* 38.1*  PLT 312323 288   BMET  Basename 09/17/11 0636 09/15/11 1957  NA 132*132* 127*  K 3.53.6 3.6  CL 9697 91*  CO2 2727 23  GLUCOSE 134*131* 99  BUN 99 14  CREATININE 0.920.91 0.79  CALCIUM 8.2*8.3* 8.9   PT/INR No results found for this basename: LABPROT:2,INR:2 in the last 72 hours ABG No results found for this basename: PHART:2,PCO2:2,PO2:2,HCO3:2 in the last 72 hours  Studies/Results: No results found. Results for orders placed during the hospital encounter of 09/15/11  CULTURE, ROUTINE-ABSCESS     Status: Normal (Preliminary result)   Collection Time   09/16/11 12:10 PM      Component Value Range Status Comment   Specimen Description ABSCESS PERITONEAL CAVITY   Final    Special Requests NONE   Final    Gram Stain     Final    Value: MODERATE WBC PRESENT, PREDOMINANTLY PMN     NO SQUAMOUS EPITHELIAL CELLS SEEN     RARE GRAM POSITIVE COCCI IN PAIRS     FEW GRAM POSITIVE RODS     MODERATE GRAM NEGATIVE RODS   Culture MULTIPLE ORGANISMS PRESENT, NONE PREDOMINANT   Final    Report Status PENDING   Incomplete   CLOSTRIDIUM DIFFICILE BY PCR      Status: Normal   Collection Time   09/18/11 11:13 AM      Component Value Range Status Comment   C difficile by pcr NEGATIVE  NEGATIVE  Final      Anti-infectives: Anti-infectives     Start     Dose/Rate Route Frequency Ordered Stop   09/18/11 1200   metroNIDAZOLE (FLAGYL) IVPB 500 mg        500 mg 100 mL/hr over 60 Minutes Intravenous 4 times per day 09/18/11 0951     09/16/11 1151   piperacillin-tazobactam (ZOSYN) IVPB 3.375 g        3.375 g 12.5 mL/hr over 240 Minutes Intravenous 3 times per day 09/16/11 1151     09/15/11 1945   piperacillin-tazobactam (ZOSYN) IVPB 3.375 g        3.375 g 12.5 mL/hr over 240 Minutes Intravenous  Once 09/15/11 1940 09/16/11 0022          Assessment/Plan: s/p RLQ abscess drainage 2/27; check final cx's, continue drain NS flushes, check f/u CT next week, monitor labs   LOS: 3 days    Justin Porter,D [facility] 09/18/2011

## 2011-09-19 DIAGNOSIS — M7989 Other specified soft tissue disorders: Secondary | ICD-10-CM

## 2011-09-19 LAB — BASIC METABOLIC PANEL
BUN: 5 mg/dL — ABNORMAL LOW (ref 6–23)
CO2: 27 mEq/L (ref 19–32)
Calcium: 8.6 mg/dL (ref 8.4–10.5)
Creatinine, Ser: 0.96 mg/dL (ref 0.50–1.35)

## 2011-09-19 LAB — CULTURE, ROUTINE-ABSCESS

## 2011-09-19 LAB — CBC
HCT: 38.9 % — ABNORMAL LOW (ref 39.0–52.0)
MCH: 31.9 pg (ref 26.0–34.0)
MCV: 94 fL (ref 78.0–100.0)
Platelets: 448 10*3/uL — ABNORMAL HIGH (ref 150–400)
RBC: 4.14 MIL/uL — ABNORMAL LOW (ref 4.22–5.81)
RDW: 13.7 % (ref 11.5–15.5)

## 2011-09-19 NOTE — Progress Notes (Signed)
Bilateral lower extremity venous duplex completed.  Preliminary report is negative for DVT, SVT, or a Baker's cyst. 

## 2011-09-19 NOTE — Progress Notes (Signed)
[facility] [place] [facility] ([facility]) - ATTENDING:  Patient with slight increase in WBC today.  Clinically improving from abdominal standpoint.  Will plan CT abdomen on Monday to follow up on abscesses and perc drain.  Wife and patient note unilateral leg swelling on right.  No hx of prior DVT.  On exam pitting edema on right to mid calf.  Will request duplex USN exam to rule out DVT. Velora Heckler, MD, [facility] Office: [phone]

## 2011-09-19 NOTE — Progress Notes (Signed)
  Subjective: Pt feeling good. Still having a lot of loose BMs. Pain better, just mildly sore near drain. Tol clears very well.  Objective: Vital signs in last 24 hours: Temp:  [97.8 F (36.6 C)-97.9 F (36.6 C)] 97.9 F (36.6 C) (03/02 4098) Pulse Rate:  [83-90] 90  (03/02 0608) Resp:  [18-22] 18  (03/02 0608) BP: (116-132)/(61-78) 132/61 mmHg (03/02 0608) SpO2:  [97 %] 97 % (03/02 0608) Last BM Date: 09/18/11  Intake/Output this shift:    Physical Exam: BP 132/61  Pulse 90  Temp(Src) 97.9 F (36.6 C) (Oral)  Resp 18  Ht 6' (1.829 m)  Wt 83.462 kg (184 lb)  BMI 24.95 kg/m2  SpO2 97% Abdomen: soft, ND, NT except near drain site, no erythema Cloudy drain output, only about 40cc/day  Labs: CBC  Basename 09/19/11 0600 09/17/11 0636  WBC 18.6* 13.4*13.4*  HGB 13.2 12.4*12.4*  HCT 38.9* 36.3*35.6*  PLT 448* 119147   BMET  Basename 09/19/11 0600 09/17/11 0636  NA 131* 132*132*  K 3.4* 3.53.6  CL 95* 9697  CO2 27 2727  GLUCOSE 90 134*131*  BUN 5* 99  CREATININE 0.96 0.920.91  CALCIUM 8.6 8.2*8.3*   LFT No results found for this basename: PROT,ALBUMIN,AST,ALT,ALKPHOS,BILITOT,BILIDIR,IBILI,LIPASE in the last 72 hours PT/INR No results found for this basename: LABPROT:2,INR:2 in the last 72 hours ABG No results found for this basename: PHART:2,PCO2:2,PO2:2,HCO3:2 in the last 72 hours  Studies/Results: No results found.  Assessment: Principal Problem:  *Ruptured appendicitis with abscess  S/P perc abscess drain    Plan: C.diff negative but diarrhea persists. WBC cont to trend up also. Will d/w MD, repeat CT?  LOS: 4 days    Alyse Low 09/19/2011 9:20 AM

## 2011-09-20 LAB — CBC
MCHC: 34.6 g/dL (ref 30.0–36.0)
MCV: 92.8 fL (ref 78.0–100.0)
Platelets: 478 10*3/uL — ABNORMAL HIGH (ref 150–400)
RDW: 13.6 % (ref 11.5–15.5)
WBC: 15.9 10*3/uL — ABNORMAL HIGH (ref 4.0–10.5)

## 2011-09-20 MED ORDER — WHITE PETROLATUM GEL
Status: AC
  Administered 2011-09-20: 18:00:00
  Filled 2011-09-20: qty 5

## 2011-09-20 NOTE — Progress Notes (Signed)
[facility] [place] [facility] ([facility]) - ATTENDING:  Patient seen and examined.  Plan repeat CT abdomen on 3/4.  Duplex exam negative for DVT.  Remains on SQ heparin. Velora Heckler, MD, [facility] Office: [phone]

## 2011-09-20 NOTE — Progress Notes (Signed)
  Subjective: Pt ok. Looks good, feels good. BMs still loose. No N/V Minimal pain  Objective: Vital signs in last 24 hours: Temp:  [97.6 F (36.4 C)-98.5 F (36.9 C)] 97.6 F (36.4 C) (03/03 0600) Pulse Rate:  [85-90] 85  (03/03 0600) Resp:  [18-20] 18  (03/03 0600) BP: (138-152)/(65-73) 152/73 mmHg (03/03 0600) SpO2:  [98 %] 98 % (03/03 0600) Last BM Date: 09/18/11  Intake/Output this shift:    Physical Exam: BP 152/73  Pulse 85  Temp(Src) 97.6 F (36.4 C) (Oral)  Resp 18  Ht 6' (1.829 m)  Wt 83.462 kg (184 lb)  BMI 24.95 kg/m2  SpO2 98% Abdomen: soft, ND, NT  Drain intact, still cloudy output. Ext: (R)LE still edematous but better than yesterday, NT Labs: CBC  Basename 09/19/11 0600  WBC 18.6*  HGB 13.2  HCT 38.9*  PLT 448*   BMET  Basename 09/19/11 0600  NA 131*  K 3.4*  CL 95*  CO2 27  GLUCOSE 90  BUN 5*  CREATININE 0.96  CALCIUM 8.6   LFT No results found for this basename: PROT,ALBUMIN,AST,ALT,ALKPHOS,BILITOT,BILIDIR,IBILI,LIPASE in the last 72 hours PT/INR No results found for this basename: LABPROT:2,INR:2 in the last 72 hours ABG No results found for this basename: PHART:2,PCO2:2,PO2:2,HCO3:2 in the last 72 hours  Studies/Results: No results found.  Assessment: Principal Problem:  *Ruptured appendicitis (R)LE edema, prelim report (-) for DVT    Plan: Check CBC TED hose If CBC better, will advance diet If it cont to rise, will plan for repeat CT in am.  LOS: 5 days    Alyse Low 09/20/2011 9:24 AM

## 2011-09-21 LAB — CBC
Hemoglobin: 12.2 g/dL — ABNORMAL LOW (ref 13.0–17.0)
MCH: 31.9 pg (ref 26.0–34.0)
MCHC: 34.4 g/dL (ref 30.0–36.0)
Platelets: 459 10*3/uL — ABNORMAL HIGH (ref 150–400)
RDW: 13.7 % (ref 11.5–15.5)

## 2011-09-21 MED ORDER — METRONIDAZOLE 500 MG PO TABS
500.0000 mg | ORAL_TABLET | Freq: Four times a day (QID) | ORAL | Status: DC
  Administered 2011-09-21 – 2011-09-23 (×8): 500 mg via ORAL
  Filled 2011-09-21 (×12): qty 1

## 2011-09-21 NOTE — Progress Notes (Signed)
  Subjective: Pt ok. Feeling better, actually hungry LEgs still swelling but TEDs on. +BM, a little less loose. Minimal pain  Objective: Vital signs in last 24 hours: Temp:  [97.5 F (36.4 C)-98 F (36.7 C)] 98 F (36.7 C) (03/04 0600) Pulse Rate:  [76-88] 85  (03/04 0600) Resp:  [18] 18  (03/04 0600) BP: (132-140)/(57-81) 138/81 mmHg (03/04 0600) SpO2:  [96 %-99 %] 96 % (03/04 0600) Last BM Date: 09/20/11  Intake/Output this shift:    Physical Exam: BP 138/81  Pulse 85  Temp(Src) 98 F (36.7 C) (Oral)  Resp 18  Ht 6' (1.829 m)  Wt 83.462 kg (184 lb)  BMI 24.95 kg/m2  SpO2 96% Abdomen: soft, ND, NT Drain intact, still some yellow cloudy drainage, about 20cc  Labs: CBC  Basename 09/21/11 0615 09/20/11 1020  WBC 12.1* 15.9*  HGB 12.2* 13.0  HCT 35.5* 37.6*  PLT 459* 478*   BMET  Basename 09/19/11 0600  NA 131*  K 3.4*  CL 95*  CO2 27  GLUCOSE 90  BUN 5*  CREATININE 0.96  CALCIUM 8.6   LFT No results found for this basename: PROT,ALBUMIN,AST,ALT,ALKPHOS,BILITOT,BILIDIR,IBILI,LIPASE in the last 72 hours PT/INR No results found for this basename: LABPROT:2,INR:2 in the last 72 hours ABG No results found for this basename: PHART:2,PCO2:2,PO2:2,HCO3:2 in the last 72 hours  Studies/Results: No results found.  Assessment: Principal Problem:  *Ruptured appendicitis s/p perc drainage of abscess    Plan: WBC continue to improve. Advance to reg diet POss CT tomorrow.  LOS: 6 days    Alyse Low 09/21/2011 8:41 AM

## 2011-09-21 NOTE — Progress Notes (Signed)
  Subjective   Doing well, appetite improving. Denies abdominal pain  Objective: Vital signs in last 24 hours: Temp:  [97.5 F (36.4 C)-98 F (36.7 C)] 98 F (36.7 C) (03/04 0600) Pulse Rate:  [76-88] 85  (03/04 0600) Resp:  [18] 18  (03/04 0600) BP: (132-140)/(57-81) 138/81 mmHg (03/04 0600) SpO2:  [96 %-99 %] 96 % (03/04 0600) Last BM Date: 09/20/11  Intake/Output from previous day: 03/03 0701 - 03/04 0700 In: 1210 [P.O.:960; IV Piggyback:250] Out: 20 [Drains:20] Intake/Output this shift: Total I/O In: 5 [Other:5] Out: 5 [Drains:5]  Physical exam : ambulating in hall with spouse.  Drain evaluated back in room - no evidence of leakage around insertion site.  Small amount of brown tinged serous fluid in JP- for f/u CT tomorrow.  Soft abdomen, non-distended with positive bowel sounds. Lab Results:   Basename 09/21/11 0615 09/20/11 1020  WBC 12.1* 15.9*  HGB 12.2* 13.0  HCT 35.5* 37.6*  PLT 459* 478*   BMET  Basename 09/19/11 0600  NA 131*  K 3.4*  CL 95*  CO2 27  GLUCOSE 90  BUN 5*  CREATININE 0.96  CALCIUM 8.6   PT/INR No results found for this basename: LABPROT:2,INR:2 in the last 72 hours ABG No results found for this basename: PHART:2,PCO2:2,PO2:2,HCO3:2 in the last 72 hours  Studies/Results: No results found.  Anti-infectives: Anti-infectives     Start     Dose/Rate Route Frequency Ordered Stop   09/21/11 1200   metroNIDAZOLE (FLAGYL) tablet 500 mg        500 mg Oral 4 times per day 09/21/11 1015     09/18/11 1200   metroNIDAZOLE (FLAGYL) IVPB 500 mg  Status:  Discontinued        500 mg 100 mL/hr over 60 Minutes Intravenous 4 times per day 09/18/11 0951 09/21/11 1015   09/16/11 1151   piperacillin-tazobactam (ZOSYN) IVPB 3.375 g        3.375 g 12.5 mL/hr over 240 Minutes Intravenous 3 times per day 09/16/11 1151     09/15/11 1945   piperacillin-tazobactam (ZOSYN) IVPB 3.375 g        3.375 g 12.5 mL/hr over 240 Minutes Intravenous  Once  09/15/11 1940 09/16/11 0022          Assessment/Plan:  Ruptured appendix s/p percutaneous drainage - output declining - for f/u CT tomorrow to evaluate collection. IR to continue to follow.    Burhan Barham D 09/21/2011

## 2011-09-21 NOTE — Progress Notes (Signed)
Patient in restroom having bowel movement currently.  No changes.  Eating regular food.  Possibly getting CT tomorrow.  Justin Porter. Gae Bon, MD, FACS [phone] [facility]

## 2011-09-22 ENCOUNTER — Inpatient Hospital Stay (HOSPITAL_COMMUNITY): Payer: Medicare Other

## 2011-09-22 ENCOUNTER — Encounter (HOSPITAL_COMMUNITY): Payer: Self-pay | Admitting: Radiology

## 2011-09-22 LAB — CBC
Hemoglobin: 13.3 g/dL (ref 13.0–17.0)
MCHC: 34.5 g/dL (ref 30.0–36.0)
Platelets: 527 10*3/uL — ABNORMAL HIGH (ref 150–400)
RDW: 13.7 % (ref 11.5–15.5)

## 2011-09-22 MED ORDER — IOHEXOL 300 MG/ML  SOLN
20.0000 mL | INTRAMUSCULAR | Status: AC
  Administered 2011-09-22 (×2): 20 mL via ORAL

## 2011-09-22 MED ORDER — IOHEXOL 300 MG/ML  SOLN
100.0000 mL | Freq: Once | INTRAMUSCULAR | Status: AC | PRN
  Administered 2011-09-22: 100 mL via INTRAVENOUS

## 2011-09-22 NOTE — Progress Notes (Signed)
  Subjective: No new complaints. Awaiting CT scan for follow up on abscess.   Objective: Vital signs in last 24 hours: Temp:  [97 F (36.1 C)-98.8 F (37.1 C)] 98.8 F (37.1 C) (03/05 0546) Pulse Rate:  [75-87] 87  (03/05 0546) Resp:  [16-20] 20  (03/05 0546) BP: (132-159)/(58-80) 159/80 mmHg (03/05 0546) SpO2:  [96 %-100 %] 97 % (03/05 0546) Last BM Date: 09/20/11  Intake/Output from previous day: 03/04 0701 - 03/05 0700 In: 1205 [P.O.:600; I.V.:600] Out: 5 [Drains:5]   PE:  Drain intact, output 3/4 = 20 ml, 5 ml output so far today.  Soft non-distended abdomen with positive bowel sounds.   Lab Results:   Basename 09/22/11 0610 09/21/11 0615  WBC 13.4* 12.1*  HGB 13.3 12.2*  HCT 38.5* 35.5*  PLT 527* 459*   Anti-infectives: Anti-infectives     Start     Dose/Rate Route Frequency Ordered Stop   09/21/11 1200   metroNIDAZOLE (FLAGYL) tablet 500 mg        500 mg Oral 4 times per day 09/21/11 1015     09/18/11 1200   metroNIDAZOLE (FLAGYL) IVPB 500 mg  Status:  Discontinued        500 mg 100 mL/hr over 60 Minutes Intravenous 4 times per day 09/18/11 0951 09/21/11 1015   09/16/11 1151   piperacillin-tazobactam (ZOSYN) IVPB 3.375 g        3.375 g 12.5 mL/hr over 240 Minutes Intravenous 3 times per day 09/16/11 1151     09/15/11 1945   piperacillin-tazobactam (ZOSYN) IVPB 3.375 g        3.375 g 12.5 mL/hr over 240 Minutes Intravenous  Once 09/15/11 1940 09/16/11 0022          Assessment/Plan:  Ruptured appendix s/p drainage 2/27 with diminishing output.  For CT later today to re-evaluate collection - WBC up some today/afebrile - monitor. Will follow.   LOS: 7 days    Alecea Trego D 09/22/2011

## 2011-09-22 NOTE — Progress Notes (Signed)
  Subjective: Pt ok. On commode. COnt BMs but getting more formed. No worse pains.  Objective: Vital signs in last 24 hours: Temp:  [97 F (36.1 C)-98.8 F (37.1 C)] 98.8 F (37.1 C) (03/05 0546) Pulse Rate:  [75-87] 87  (03/05 0546) Resp:  [16-20] 20  (03/05 0546) BP: (132-159)/(58-80) 159/80 mmHg (03/05 0546) SpO2:  [96 %-100 %] 97 % (03/05 0546) Last BM Date: 09/20/11  Intake/Output this shift:    Physical Exam: BP 159/80  Pulse 87  Temp(Src) 98.8 F (37.1 C) (Oral)  Resp 20  Ht 6' (1.829 m)  Wt 83.462 kg (184 lb)  BMI 24.95 kg/m2  SpO2 97% --------  Labs: CBC  Basename 09/22/11 0610 09/21/11 0615  WBC 13.4* 12.1*  HGB 13.3 12.2*  HCT 38.5* 35.5*  PLT 527* 459*   BMET No results found for this basename: NA:2,K:2,CL:2,CO2:2,GLUCOSE:2,BUN:2,CREATININE:2,CALCIUM:2 in the last 72 hours LFT No results found for this basename: PROT,ALBUMIN,AST,ALT,ALKPHOS,BILITOT,BILIDIR,IBILI,LIPASE in the last 72 hours PT/INR No results found for this basename: LABPROT:2,INR:2 in the last 72 hours ABG No results found for this basename: PHART:2,PCO2:2,PO2:2,HCO3:2 in the last 72 hours  Studies/Results: No results found.  Assessment: Principal Problem:  *Ruptured appendicitis     Plan: WBC sl up today CT today to reassess abscess.  LOS: 7 days    Alyse Low 09/22/2011 8:12 AM

## 2011-09-22 NOTE — Progress Notes (Signed)
For repeat CT today.  Management will depend a bit on CT findings.  Clinically the patient is doing well.  I would not consider interval appendectomy at this time.  Marta Lamas. Gae Bon, MD, FACS [phone] [facility]

## 2011-09-23 MED ORDER — HYDROCODONE-ACETAMINOPHEN 5-325 MG PO TABS: 1.0000 | ORAL_TABLET | ORAL | Status: AC | PRN

## 2011-09-23 MED ORDER — METRONIDAZOLE 500 MG PO TABS: 500.0000 mg | ORAL_TABLET | Freq: Four times a day (QID) | ORAL | Status: AC

## 2011-09-23 MED ORDER — CIPROFLOXACIN HCL 500 MG PO TABS: 500.0000 mg | ORAL_TABLET | Freq: Two times a day (BID) | ORAL | Status: AC

## 2011-09-23 NOTE — Progress Notes (Signed)
Patient ID: Justin Porter, male   DOB: 12-Aug-1928, 78 y.o.   MRN: [id number]    Subjective: Pt feels well.  No c/o.  Tolerating a regular diet.  No pain.  Objective: Vital signs in last 24 hours: Temp:  [97.5 F (36.4 C)-98.4 F (36.9 C)] 97.5 F (36.4 C) (03/06 0553) Pulse Rate:  [75-88] 75  (03/06 0553) Resp:  [18-20] 18  (03/06 0553) BP: (135-143)/(74-77) 143/76 mmHg (03/06 0553) SpO2:  [97 %-100 %] 97 % (03/06 0553) Last BM Date: 09/20/11  Intake/Output from previous day: 03/05 0701 - 03/06 0700 In: 370 [P.O.:120; I.V.:240] Out: 8 [Drains:8] Intake/Output this shift:    PE: Abd: soft, NT, ND, +BS, JP with minimal serous output.  Lab Results:   Basename 09/22/11 0610 09/21/11 0615  WBC 13.4* 12.1*  HGB 13.3 12.2*  HCT 38.5* 35.5*  PLT 527* 459*   BMET No results found for this basename: NA:2,K:2,CL:2,CO2:2,GLUCOSE:2,BUN:2,CREATININE:2,CALCIUM:2 in the last 72 hours PT/INR No results found for this basename: LABPROT:2,INR:2 in the last 72 hours   Studies/Results: Ct Abdomen Pelvis W Contrast  09/22/2011  *RADIOLOGY REPORT*  Clinical Data: Ruptured appendicitis with abscess, status post percutaneous drainage.  CT ABDOMEN AND PELVIS WITH CONTRAST  Technique:  Multidetector CT imaging of the abdomen and pelvis was performed following the standard protocol during bolus administration of intravenous contrast.  Contrast: OMNIPAQUE IOHEXOL 300 MG/ML IJ SOLN  Comparison: Multiple exams, including 09/16/2011 and 09/15/2011  Findings: Focal hepatic steatosis noted along the falciform ligament.  The spleen, pancreas, and adrenal glands appear unremarkable.  The gallbladder and biliary system appear unremarkable.  The kidneys appear unremarkable, as do the proximal ureters.  Dextroconvex thoracolumbar scoliosis noted with rotary component. Prior lumbar operative intervention noted.  There has been successful drainage of the right lower quadrant abscess, without significant  residual fluid in this vicinity. There is adjacent inflammatory stranding in the adjacent omentum and mesentery, with a small locule of extraluminal gas shown on image 61 of series 2 near the right inguinal ring.  There continues to be inflammatory phlegmon extending in the right inguinal ring, potentially with a small tiny amount of fluid/abscess.  Urinary bladder is relatively empty.  Oral contrast noted in the terminal ileum and cecum, without extraluminal oral contrast observed.  Chronic soft tissue density along the pubic symphysis anterior margin noted, with evidence of chondrocalcinosis in the pubic symphysis.  Reduced amount of free pelvic fluid anterior to the rectum has faintly enhancing margins.  IMPRESSION:  1.  The dominant right lower quadrant abscess has been successfully drained. 2.  Phlegmon and potentially a small abscess in the right inguinal ring region. 3.  Pelvic fluid collection anterior to the rectum has thin enhancing margins and measures 6.0 x 1.8 cm (formerly 7.6 x 3.1 cm). This may represent abscess but has significantly reduced in size. 4.  Continued thickening of soft tissues anterior to the pubic symphysis, which appears to be chronic. 5.  Absent prostate gland noted.  Original Report Authenticated By: Dellia Cloud, M.D.    Anti-infectives: Anti-infectives     Start     Dose/Rate Route Frequency Ordered Stop   09/21/11 1200   metroNIDAZOLE (FLAGYL) tablet 500 mg        500 mg Oral 4 times per day 09/21/11 1015     09/18/11 1200   metroNIDAZOLE (FLAGYL) IVPB 500 mg  Status:  Discontinued        500 mg 100 mL/hr over 60 Minutes Intravenous  4 times per day 09/18/11 0951 09/21/11 1015   09/16/11 1151  piperacillin-tazobactam (ZOSYN) IVPB 3.375 g       3.375 g 12.5 mL/hr over 240 Minutes Intravenous 3 times per day 09/16/11 1151     09/15/11 1945  piperacillin-tazobactam (ZOSYN) IVPB 3.375 g       3.375 g 12.5 mL/hr over 240 Minutes Intravenous  Once 09/15/11  1940 09/16/11 0022           Assessment/Plan  1. Acute perf appendicitic, s/p perc drain  Plan: 1. Will have IR pull drain today as CT shows resolution of drained abscess 2. RTC in 2-3 weeks for follow up visit 3. D/c home with 14 days of abx therapy.   LOS: 8 days    Redell Nazir E 09/23/2011

## 2011-09-23 NOTE — Discharge Instructions (Signed)
Appendicitis °Appendicitis is when the appendix is swollen (inflamed). The inflammation can lead to developing a hole (perforation) and a collection of pus (abscess). °CAUSES  °There is not always an obvious cause of appendicitis. Sometimes it is caused by an obstruction in the appendix. The obstruction can be caused by: °· A small, hard, pea-sized ball of stool (fecalith).  °· Enlarged lymph glands in the appendix.  °SYMPTOMS  °· Pain around your belly button (navel) that moves toward your lower right belly (abdomen). The pain can become more severe and sharp as time passes.  °· Tenderness in the lower right abdomen. Pain gets worse if you cough or make a sudden movement.  °· Feeling sick to your stomach (nauseous).  °· Throwing up (vomiting).  °· Loss of appetite.  °· Fever.  °· Constipation.  °· Diarrhea.  °· Generally not feeling well.  °DIAGNOSIS  °· Physical exam.  °· Blood tests.  °· Urine test.  °· X-rays or a CT scan may confirm the diagnosis.  °TREATMENT  °Once the diagnosis of appendicitis is made, the most common treatment is to remove the appendix as soon as possible. This procedure is called appendectomy. In an open appendectomy, a cut (incision) is made in the lower right abdomen and the appendix is removed. In a laparoscopic appendectomy, usually 3 small incisions are made. Long, thin instruments and a camera tube are used to remove the appendix. Most patients go home in 24 to 28 hours after appendectomy. °In some situations, the appendix may have already perforated and an abscess may have formed. The abscess may have a "wall" around it as seen on a CT scan. In this case, a drain may be placed into the abscess and you may be treated with medicines (antibiotics) that kill germs. Once the abscess has resolved, it may or may not be necessary to have an appendectomy. °Document Released: 07/06/2005 Document Revised: 06/25/2011 Document Reviewed: 10/01/2009 °ExitCare® Patient Information ©2012 [employer]. °

## 2011-09-23 NOTE — Progress Notes (Signed)
Yesterday's CT shows resolution of periappendiceal abscess.  Drain to be removed and patient to be discharged to home on oral antibiotics.  Justin Porter. Gae Bon, MD, FACS [phone] [facility]

## 2011-09-23 NOTE — Progress Notes (Signed)
Patient ID: Justin Porter, male   DOB: 06-26-1929, 78 y.o.   MRN: [id number]   RLQ drain placed 2/27 in IR for abscess post ruptured appendix.  CT 3/5 shows resolution of abscess; output minimal afeb  Call from [facility] with surgery to request drain removal.  Drain removed at bedside without problem. Bandage placed. Pt tolerated well.

## 2011-09-23 NOTE — Discharge Summary (Signed)
Okay for this patient to go home on oral antibiotics.  Whether or not he needs an interval appendectomy is to be decided.  Justin Porter. Gae Bon, MD, FACS [phone] [facility] Surgery

## 2011-09-23 NOTE — Discharge Summary (Signed)
Patient ID: Justin Porter MRN: [id number] DOB/AGE: 1928/10/28 78 y.o.  Admit date: 09/15/2011 Discharge date: 09/23/2011  Procedures:  Placement of percutaneous drain in RLQ abscess by IR  Consults: None  Reason for Admission:  This is an 78 yo male who presented to [facility] after 7 days of abdominal pain.  It was worse in his RLQ.  He had a CT scan that revealed perforated appendicitis with a complex multiloculated abscess.  He was then admitted.  Please see H&P for further details.  Admission Diagnoses: 1. Acute perforated appendicitis  Hospital Course: the patient was admitted and placed on IV abx therapy.  IR was then consulted for placement of percutaneous abscess drain with conservative management for perforated appendicitis.  He did improve after placement of this drain from a pain standpoint.  His WBC did begin to trend up to 18K after several days, but then began to trend down once again on it's own.  After about a week, his drain was only putting out about 5-10cc/day and a CT scan was repeated.  This revealed resolution of the abscess that the drain was placed in.  The other fluid collections that were noted on the first CT scan were significantly smaller and improving.  His WBCs were trending back down, he was tolerating a regular diet, and had no pain.  At this time, he was felt stable for discharge home.  Of note, he did have some diarrhea during this admission and C. Diff colitis was ruled out.  He has some unilateral right leg edema as well.  A duplex was ordered, but no evidence of DVT was found.  This also resolved.  Otherwise, the patient has no other problems during this admission.  Discharge Diagnoses:  Principal Problem:  *Ruptured appendicitis s/p percutaneous abscess drainage, now removed  Discharge Medications: Medication List  As of 09/23/2011  8:09 AM   TAKE these medications         ciprofloxacin 500 MG tablet   Commonly known as: CIPRO   Take 1 tablet (500 mg  total) by mouth 2 (two) times daily.      Fish Oil 1200 MG Caps   Take 1 capsule by mouth 3 (three) times daily.      furosemide 20 MG tablet   Commonly known as: LASIX   Take 20 mg by mouth 2 (two) times daily.      HYDROcodone-acetaminophen 5-325 MG per tablet   Commonly known as: NORCO   Take 1-2 tablets by mouth every 4 (four) hours as needed.      metroNIDAZOLE 500 MG tablet   Commonly known as: FLAGYL   Take 1 tablet (500 mg total) by mouth every 6 (six) hours.      mulitivitamin with minerals Tabs   Take 1 tablet by mouth every morning.      Potassium 99 MG Tabs   Take 1 tablet by mouth every morning.      simvastatin 20 MG tablet   Commonly known as: ZOCOR   Take 20 mg by mouth every morning.            Discharge Instructions: Follow-up Information    Follow up with Georgann Housekeeper, MD. (As needed)       Follow up with [facility], MD. Schedule an appointment as soon as possible for a visit in 2 weeks.   Contact information:   [employer] [facility] [address] [place] [address] [phone]  Signed: Olusegun Gerstenberger E 09/23/2011, 8:09 AM

## 2011-10-01 ENCOUNTER — Other Ambulatory Visit: Payer: Self-pay | Admitting: Internal Medicine

## 2011-10-01 ENCOUNTER — Ambulatory Visit
Admission: RE | Admit: 2011-10-01 | Discharge: 2011-10-01 | Disposition: A | Discharge status: Home / Self Care | Payer: Medicare Other | Source: Ambulatory Visit | Attending: Internal Medicine | Admitting: Internal Medicine

## 2011-10-01 DIAGNOSIS — R1032 Left lower quadrant pain: Secondary | ICD-10-CM | POA: Diagnosis not present

## 2011-10-01 DIAGNOSIS — R109 Unspecified abdominal pain: Secondary | ICD-10-CM

## 2011-10-01 DIAGNOSIS — R197 Diarrhea, unspecified: Secondary | ICD-10-CM

## 2011-10-01 MED ORDER — IOHEXOL 300 MG/ML  SOLN
100.0000 mL | Freq: Once | INTRAMUSCULAR | Status: AC | PRN
  Administered 2011-10-01: 100 mL via INTRAVENOUS

## 2011-10-01 MED ORDER — IOHEXOL 300 MG/ML  SOLN
30.0000 mL | INTRAMUSCULAR | Status: AC
  Administered 2011-10-01: 30 mL via ORAL

## 2011-10-02 ENCOUNTER — Telehealth (INDEPENDENT_AMBULATORY_CARE_PROVIDER_SITE_OTHER): Payer: Self-pay | Admitting: General Surgery

## 2011-10-02 NOTE — Telephone Encounter (Signed)
Pt's wife calling in concerned about ongoing need for pain medication.  He saw Dr. Donette Larry yesterday; he ordered another CT (done) and Vicodin (which has not been filled yet.)  Wife states he is "living on pain medicine," taking 1 hydrocodone 5/325 q6h (occasionally takes two together.)  He does not have a fever now. I reassured her and recommended he increase the pain med to 2 caps q6h until pain is significantly improved, then decrease to 1 pill at a time.  He can use warmth to the area for topical comfort.  Call back is pain worsens over weekend.  Keep appt next week with Dr. Donell Beers.

## 2011-10-06 ENCOUNTER — Encounter (INDEPENDENT_AMBULATORY_CARE_PROVIDER_SITE_OTHER): Payer: Self-pay | Admitting: General Surgery

## 2011-10-06 ENCOUNTER — Ambulatory Visit (INDEPENDENT_AMBULATORY_CARE_PROVIDER_SITE_OTHER): Payer: Medicare Other | Admitting: General Surgery

## 2011-10-06 VITALS — BP 126/78 | HR 72 | Temp 97.8°F | Resp 18 | Ht 72.0 in | Wt 175.0 lb

## 2011-10-06 DIAGNOSIS — K3532 Acute appendicitis with perforation and localized peritonitis, without abscess: Secondary | ICD-10-CM

## 2011-10-06 DIAGNOSIS — K352 Acute appendicitis with generalized peritonitis, without abscess: Secondary | ICD-10-CM | POA: Diagnosis not present

## 2011-10-06 MED ORDER — OXYCODONE HCL 5 MG PO TABS: 5.0000 mg | ORAL_TABLET | Freq: Four times a day (QID) | ORAL | Status: DC | PRN

## 2011-10-06 MED ORDER — CIPROFLOXACIN HCL 750 MG PO TABS: 750.0000 mg | ORAL_TABLET | Freq: Two times a day (BID) | ORAL | Status: DC

## 2011-10-06 NOTE — Patient Instructions (Signed)
Plan laparoscopic appendectomy and look at left side.    Will refill cipro and pain medication.

## 2011-10-06 NOTE — Assessment & Plan Note (Signed)
Refill cipro and flagyl Refill pain medication I do not understand why he still has significant left sided pain.   It has not gotten better or worse, but still is severe. CT 4 days ago does not show any recurrent abscess. Will plan interval appendectomy and laparoscopic evaluation of left side.

## 2011-10-06 NOTE — Progress Notes (Signed)
HISTORY: Pt is s/p perc drain for abscess from presumed ruptured appendicitis.  He continues to have LLQ pain that did not change even with the drain.  He had a CT last week demonstrating resolution of abscess, yet continues to have severe left sided pain.  He has not had nausea or vomiting.  He denies fevers and chills.     PERTINENT REVIEW OF SYSTEMS: Very hard of hearing.   EXAM: Head: Normocephalic and atraumatic.  Eyes:  Conjunctivae are normal. Pupils are equal, round, and reactive to light. No scleral icterus.  Neck:  Normal range of motion. Neck supple. No tracheal deviation present. No thyromegaly present.  Resp: No respiratory distress, normal effort. Abd:  Abdomen is soft, non distended, tender in the LLQ.  No evidence of inguinal hernia.  No cellulitis. No masses are palpable.  There is no rebound and no guarding.  Neurological: Alert and oriented to person, place, and time. Coordination normal. Difficulty with hearing. Skin: Skin is warm and dry. No rash noted. No diaphoretic. No erythema. No pallor.  Psychiatric: Normal mood and affect. Normal behavior. Judgment and thought content normal.      ASSESSMENT AND PLAN:   Ruptured appendicitis Refill cipro and flagyl Refill pain medication I do not understand why he still has significant left sided pain.   It has not gotten better or worse, but still is severe. CT 4 days ago does not show any recurrent abscess. Will plan interval appendectomy and laparoscopic evaluation of left side.          Maudry Diego, MD Surgical Oncology, General & Endocrine Surgery [facility]  Georgann Housekeeper, MD, MD Georgann Housekeeper, MD

## 2011-10-08 ENCOUNTER — Encounter (HOSPITAL_COMMUNITY): Payer: Self-pay | Admitting: Pharmacy Technician

## 2011-10-08 ENCOUNTER — Other Ambulatory Visit: Payer: Self-pay

## 2011-10-08 ENCOUNTER — Ambulatory Visit (HOSPITAL_COMMUNITY)
Admission: RE | Admit: 2011-10-08 | Discharge: 2011-10-08 | Disposition: A | Discharge status: Home / Self Care | Payer: Medicare Other | Source: Ambulatory Visit | Attending: General Surgery | Admitting: General Surgery

## 2011-10-08 ENCOUNTER — Encounter (HOSPITAL_COMMUNITY): Payer: Self-pay

## 2011-10-08 ENCOUNTER — Encounter (HOSPITAL_COMMUNITY)
Admission: RE | Admit: 2011-10-08 | Discharge: 2011-10-08 | Disposition: A | Discharge status: Home / Self Care | Payer: Medicare Other | Source: Ambulatory Visit | Attending: General Surgery | Admitting: General Surgery

## 2011-10-08 DIAGNOSIS — Z01810 Encounter for preprocedural cardiovascular examination: Secondary | ICD-10-CM | POA: Insufficient documentation

## 2011-10-08 DIAGNOSIS — Z01812 Encounter for preprocedural laboratory examination: Secondary | ICD-10-CM | POA: Diagnosis not present

## 2011-10-08 DIAGNOSIS — Z01818 Encounter for other preprocedural examination: Secondary | ICD-10-CM | POA: Insufficient documentation

## 2011-10-08 DIAGNOSIS — I10 Essential (primary) hypertension: Secondary | ICD-10-CM | POA: Diagnosis not present

## 2011-10-08 DIAGNOSIS — Z01811 Encounter for preprocedural respiratory examination: Secondary | ICD-10-CM | POA: Diagnosis not present

## 2011-10-08 DIAGNOSIS — K37 Unspecified appendicitis: Secondary | ICD-10-CM | POA: Diagnosis not present

## 2011-10-08 LAB — DIFFERENTIAL
Basophils Absolute: 0.1 10*3/uL (ref 0.0–0.1)
Basophils Relative: 1 % (ref 0–1)
Eosinophils Absolute: 0 10*3/uL (ref 0.0–0.7)
Monocytes Relative: 8 % (ref 3–12)
Neutrophils Relative %: 66 % (ref 43–77)

## 2011-10-08 LAB — BASIC METABOLIC PANEL
BUN: 7 mg/dL (ref 6–23)
Calcium: 9.2 mg/dL (ref 8.4–10.5)
Creatinine, Ser: 0.8 mg/dL (ref 0.50–1.35)
GFR calc Af Amer: >90 mL/min (ref >90)
GFR calc non Af Amer: 82 mL/min — ABNORMAL LOW (ref >90)
Glucose, Bld: 89 mg/dL (ref 70–99)

## 2011-10-08 LAB — URINALYSIS, ROUTINE W REFLEX MICROSCOPIC
Bilirubin Urine: NEGATIVE
Ketones, ur: NEGATIVE mg/dL
Nitrite: NEGATIVE
Protein, ur: NEGATIVE mg/dL
pH: 7 (ref 5.0–8.0)

## 2011-10-08 LAB — CBC
Hemoglobin: 14 g/dL (ref 13.0–17.0)
MCH: 31.6 pg (ref 26.0–34.0)
MCHC: 32.9 g/dL (ref 30.0–36.0)
Platelets: 387 10*3/uL (ref 150–400)
RDW: 14.1 % (ref 11.5–15.5)

## 2011-10-08 LAB — PROTIME-INR: INR: 1.12 (ref 0.00–1.49)

## 2011-10-08 NOTE — Patient Instructions (Addendum)
20 DUY LEMMING  10/08/2011   Your procedure is scheduled on:  10-14-2011  Report to [facility] at 0600  AM.  Call this number if you have problems the morning of surgery: [phone]   Remember:   Do not eat food or drink liquids:After Midnight.  .  Take these medicines the morning of surgery with A SIP OF WATER: no meds to take   Do not wear jewelry or make up.  Do not wear lotions, powders, or perfumes.Do not wear deodorant.    Do not bring valuables to the hospital.  Contacts, dentures or bridgework may not be worn into surgery.  Leave suitcase in the car. After surgery it may be brought to your room.  For patients admitted to the hospital, checkout time is 11:00 AM the day of discharge.     Special Instructions: [employer] forces training and education [employer] [place]: 1/2 bottle night before surgery and 1/2 bottle morning of surgery.neck down avoid private area   Please read over the following fact sheets that you were given: MRSA Information  C[facility] S[facility] pre op nurse phone number [phone], call if needed

## 2011-10-14 ENCOUNTER — Other Ambulatory Visit: Payer: Self-pay

## 2011-10-14 ENCOUNTER — Inpatient Hospital Stay (HOSPITAL_COMMUNITY)
Admission: RE | Admit: 2011-10-14 | Discharge: 2011-10-17 | DRG: 340 | Disposition: A | Discharge status: Skilled Nursing Facility | Payer: Medicare Other | Source: Ambulatory Visit | Attending: General Surgery | Admitting: General Surgery

## 2011-10-14 ENCOUNTER — Encounter (HOSPITAL_COMMUNITY): Payer: Self-pay | Admitting: General Surgery

## 2011-10-14 ENCOUNTER — Encounter (HOSPITAL_COMMUNITY): Payer: Self-pay

## 2011-10-14 ENCOUNTER — Encounter (HOSPITAL_COMMUNITY): Payer: Self-pay | Admitting: Anesthesiology

## 2011-10-14 ENCOUNTER — Encounter (HOSPITAL_COMMUNITY)
Admission: RE | Disposition: A | Discharge status: Skilled Nursing Facility | Payer: Self-pay | Source: Ambulatory Visit | Attending: General Surgery

## 2011-10-14 ENCOUNTER — Ambulatory Visit (HOSPITAL_COMMUNITY): Payer: Medicare Other | Admitting: Anesthesiology

## 2011-10-14 DIAGNOSIS — K3580 Unspecified acute appendicitis: Secondary | ICD-10-CM | POA: Diagnosis not present

## 2011-10-14 DIAGNOSIS — Z923 Personal history of irradiation: Secondary | ICD-10-CM

## 2011-10-14 DIAGNOSIS — I10 Essential (primary) hypertension: Secondary | ICD-10-CM | POA: Diagnosis present

## 2011-10-14 DIAGNOSIS — Z96659 Presence of unspecified artificial knee joint: Secondary | ICD-10-CM

## 2011-10-14 DIAGNOSIS — R0989 Other specified symptoms and signs involving the circulatory and respiratory systems: Secondary | ICD-10-CM | POA: Diagnosis not present

## 2011-10-14 DIAGNOSIS — R109 Unspecified abdominal pain: Secondary | ICD-10-CM | POA: Diagnosis not present

## 2011-10-14 DIAGNOSIS — I498 Other specified cardiac arrhythmias: Secondary | ICD-10-CM | POA: Diagnosis not present

## 2011-10-14 DIAGNOSIS — K35209 Acute appendicitis with generalized peritonitis, without abscess, unspecified as to perforation: Principal | ICD-10-CM | POA: Diagnosis present

## 2011-10-14 DIAGNOSIS — Z8546 Personal history of malignant neoplasm of prostate: Secondary | ICD-10-CM

## 2011-10-14 DIAGNOSIS — E785 Hyperlipidemia, unspecified: Secondary | ICD-10-CM | POA: Diagnosis present

## 2011-10-14 DIAGNOSIS — M129 Arthropathy, unspecified: Secondary | ICD-10-CM | POA: Diagnosis present

## 2011-10-14 DIAGNOSIS — Z5189 Encounter for other specified aftercare: Secondary | ICD-10-CM | POA: Diagnosis not present

## 2011-10-14 DIAGNOSIS — K37 Unspecified appendicitis: Secondary | ICD-10-CM | POA: Diagnosis not present

## 2011-10-14 DIAGNOSIS — I4891 Unspecified atrial fibrillation: Secondary | ICD-10-CM | POA: Diagnosis present

## 2011-10-14 DIAGNOSIS — Z85118 Personal history of other malignant neoplasm of bronchus and lung: Secondary | ICD-10-CM

## 2011-10-14 DIAGNOSIS — K3532 Acute appendicitis with perforation and localized peritonitis, without abscess: Secondary | ICD-10-CM

## 2011-10-14 DIAGNOSIS — K352 Acute appendicitis with generalized peritonitis, without abscess: Principal | ICD-10-CM | POA: Diagnosis present

## 2011-10-14 DIAGNOSIS — Z01810 Encounter for preprocedural cardiovascular examination: Secondary | ICD-10-CM | POA: Diagnosis not present

## 2011-10-14 DIAGNOSIS — Z902 Acquired absence of lung [part of]: Secondary | ICD-10-CM | POA: Diagnosis not present

## 2011-10-14 DIAGNOSIS — H9190 Unspecified hearing loss, unspecified ear: Secondary | ICD-10-CM | POA: Diagnosis present

## 2011-10-14 DIAGNOSIS — R52 Pain, unspecified: Secondary | ICD-10-CM | POA: Diagnosis not present

## 2011-10-14 LAB — CBC
HCT: 39.4 % (ref 39.0–52.0)
MCH: 31.9 pg (ref 26.0–34.0)
MCHC: 33.8 g/dL (ref 30.0–36.0)
MCV: 94.5 fL (ref 78.0–100.0)
Platelets: 284 10*3/uL (ref 150–400)
RDW: 13.9 % (ref 11.5–15.5)

## 2011-10-14 LAB — CARDIAC PANEL(CRET KIN+CKTOT+MB+TROPI)
CK, MB: 3.2 ng/mL (ref 0.3–4.0)
Relative Index: 3.1 — ABNORMAL HIGH (ref 0.0–2.5)

## 2011-10-14 LAB — PRO B NATRIURETIC PEPTIDE: Pro B Natriuretic peptide (BNP): 136.6 pg/mL (ref 0–450)

## 2011-10-14 SURGERY — APPENDECTOMY, LAPAROSCOPIC
Anesthesia: General

## 2011-10-14 MED ORDER — ATORVASTATIN CALCIUM 10 MG PO TABS
10.0000 mg | ORAL_TABLET | Freq: Every day | ORAL | Status: DC
  Administered 2011-10-14 – 2011-10-16 (×3): 10 mg via ORAL
  Filled 2011-10-14 (×4): qty 1

## 2011-10-14 MED ORDER — ACETAMINOPHEN 10 MG/ML IV SOLN
INTRAVENOUS | Status: AC
  Filled 2011-10-14: qty 100

## 2011-10-14 MED ORDER — SODIUM CHLORIDE 0.9 % IV SOLN
1.0000 g | INTRAVENOUS | Status: AC
  Administered 2011-10-14: 1 g via INTRAVENOUS

## 2011-10-14 MED ORDER — DIPHENHYDRAMINE HCL 12.5 MG/5ML PO ELIX: 12.5000 mg | ORAL_SOLUTION | Freq: Four times a day (QID) | ORAL | Status: DC | PRN

## 2011-10-14 MED ORDER — SODIUM CHLORIDE 0.9 % IV SOLN
INTRAVENOUS | Status: DC | PRN
  Administered 2011-10-14: 08:00:00 via INTRAVENOUS

## 2011-10-14 MED ORDER — SIMVASTATIN 20 MG PO TABS: 20.0000 mg | ORAL_TABLET | Freq: Every evening | ORAL | Status: DC

## 2011-10-14 MED ORDER — MORPHINE SULFATE 2 MG/ML IJ SOLN: 1.0000 mg | INTRAMUSCULAR | Status: DC | PRN

## 2011-10-14 MED ORDER — FUROSEMIDE 20 MG PO TABS
20.0000 mg | ORAL_TABLET | Freq: Two times a day (BID) | ORAL | Status: DC
  Administered 2011-10-14 – 2011-10-17 (×6): 20 mg via ORAL
  Filled 2011-10-14 (×7): qty 1

## 2011-10-14 MED ORDER — HEPARIN SODIUM (PORCINE) 5000 UNIT/ML IJ SOLN
5000.0000 [IU] | Freq: Three times a day (TID) | INTRAMUSCULAR | Status: DC
  Administered 2011-10-14 (×2): 5000 [IU] via SUBCUTANEOUS
  Filled 2011-10-14 (×6): qty 1

## 2011-10-14 MED ORDER — OXYCODONE HCL 5 MG PO TABS: 5.0000 mg | ORAL_TABLET | Freq: Four times a day (QID) | ORAL | Status: DC | PRN

## 2011-10-14 MED ORDER — 0.9 % SODIUM CHLORIDE (POUR BTL) OPTIME
TOPICAL | Status: DC | PRN
  Administered 2011-10-14: 1000 mL

## 2011-10-14 MED ORDER — LACTATED RINGERS IR SOLN
Status: DC | PRN
  Administered 2011-10-14: 1000 mL

## 2011-10-14 MED ORDER — DILTIAZEM LOAD VIA INFUSION
10.0000 mg | Freq: Once | INTRAVENOUS | Status: DC
  Administered 2011-10-14: 10 mg via INTRAVENOUS
  Filled 2011-10-14: qty 10

## 2011-10-14 MED ORDER — LIDOCAINE HCL 1 % IJ SOLN
INTRAMUSCULAR | Status: AC
  Filled 2011-10-14: qty 20

## 2011-10-14 MED ORDER — SODIUM CHLORIDE 0.9 % IV SOLN: INTRAVENOUS | Status: DC

## 2011-10-14 MED ORDER — ACETAMINOPHEN 10 MG/ML IV SOLN
INTRAVENOUS | Status: DC | PRN
  Administered 2011-10-14: 1000 mg via INTRAVENOUS

## 2011-10-14 MED ORDER — PROMETHAZINE HCL 25 MG/ML IJ SOLN: 6.2500 mg | INTRAMUSCULAR | Status: DC | PRN

## 2011-10-14 MED ORDER — PANTOPRAZOLE SODIUM 40 MG IV SOLR
40.0000 mg | Freq: Every day | INTRAVENOUS | Status: DC
  Administered 2011-10-14 – 2011-10-16 (×3): 40 mg via INTRAVENOUS
  Filled 2011-10-14 (×4): qty 40

## 2011-10-14 MED ORDER — BUPIVACAINE-EPINEPHRINE PF 0.25-1:200000 % IJ SOLN
INTRAMUSCULAR | Status: AC
  Filled 2011-10-14: qty 30

## 2011-10-14 MED ORDER — DILTIAZEM HCL 100 MG IV SOLR
5.0000 mg/h | INTRAVENOUS | Status: DC
  Administered 2011-10-14: 5 mg/h via INTRAVENOUS
  Administered 2011-10-14: 10 mg/h via INTRAVENOUS
  Filled 2011-10-14: qty 100

## 2011-10-14 MED ORDER — SODIUM CHLORIDE 0.9 % IV SOLN
INTRAVENOUS | Status: AC
  Filled 2011-10-14: qty 1

## 2011-10-14 MED ORDER — CIPROFLOXACIN HCL 500 MG PO TABS
500.0000 mg | ORAL_TABLET | Freq: Two times a day (BID) | ORAL | Status: DC
  Administered 2011-10-14 – 2011-10-17 (×6): 500 mg via ORAL
  Filled 2011-10-14 (×7): qty 1

## 2011-10-14 MED ORDER — POTASSIUM 99 MG PO TABS: 1.0000 | ORAL_TABLET | ORAL | Status: DC

## 2011-10-14 MED ORDER — FENTANYL CITRATE 0.05 MG/ML IJ SOLN: 25.0000 ug | INTRAMUSCULAR | Status: DC | PRN

## 2011-10-14 MED ORDER — METRONIDAZOLE 500 MG PO TABS
500.0000 mg | ORAL_TABLET | Freq: Four times a day (QID) | ORAL | Status: DC
  Administered 2011-10-14 – 2011-10-17 (×11): 500 mg via ORAL
  Filled 2011-10-14 (×15): qty 1

## 2011-10-14 MED ORDER — KCL IN DEXTROSE-NACL 10-5-0.45 MEQ/L-%-% IV SOLN
INTRAVENOUS | Status: DC
  Administered 2011-10-14: 75 mL/h via INTRAVENOUS
  Administered 2011-10-15: 04:00:00 via INTRAVENOUS
  Administered 2011-10-15: 75 mL/h via INTRAVENOUS
  Administered 2011-10-16: 1000 mL via INTRAVENOUS
  Administered 2011-10-16: 07:00:00 via INTRAVENOUS
  Filled 2011-10-14 (×7): qty 1000

## 2011-10-14 MED ORDER — DIPHENHYDRAMINE HCL 50 MG/ML IJ SOLN: 12.5000 mg | Freq: Four times a day (QID) | INTRAMUSCULAR | Status: DC | PRN

## 2011-10-14 MED ORDER — ONDANSETRON HCL 4 MG/2ML IJ SOLN: 4.0000 mg | Freq: Four times a day (QID) | INTRAMUSCULAR | Status: DC | PRN

## 2011-10-14 SURGICAL SUPPLY — 45 items
APL SKNCLS STERI-STRIP NONHPOA (GAUZE/BANDAGES/DRESSINGS)
APPLIER CLIP ROT 10 11.4 M/L (STAPLE)
APR CLP MED LRG 11.4X10 (STAPLE)
BAG SPEC RTRVL LRG 6X4 10 (ENDOMECHANICALS)
BENZOIN TINCTURE PRP APPL 2/3 (GAUZE/BANDAGES/DRESSINGS) ×1 IMPLANT
CANISTER SUCTION 2500CC (MISCELLANEOUS) ×1 IMPLANT
CLIP APPLIE ROT 10 11.4 M/L (STAPLE) IMPLANT
CLOTH BEACON ORANGE TIMEOUT ST (SAFETY) ×1 IMPLANT
CUTTER FLEX LINEAR 45M (STAPLE) IMPLANT
DECANTER SPIKE VIAL GLASS SM (MISCELLANEOUS) ×1 IMPLANT
DRAPE LAPAROSCOPIC ABDOMINAL (DRAPES) ×1 IMPLANT
DRSG TEGADERM 2-3/8X2-3/4 SM (GAUZE/BANDAGES/DRESSINGS) IMPLANT
DRSG TEGADERM 4X4.75 (GAUZE/BANDAGES/DRESSINGS) IMPLANT
ELECT REM PT RETURN 9FT ADLT (ELECTROSURGICAL)
ELECTRODE REM PT RTRN 9FT ADLT (ELECTROSURGICAL) ×1 IMPLANT
ENDOLOOP SUT PDS II  0 18 (SUTURE)
ENDOLOOP SUT PDS II 0 18 (SUTURE) IMPLANT
GLOVE BIO SURGEON STRL SZ 6 (GLOVE) ×2 IMPLANT
GLOVE BIOGEL PI IND STRL 6.5 (GLOVE) ×1 IMPLANT
GLOVE BIOGEL PI IND STRL 7.0 (GLOVE) ×1 IMPLANT
GLOVE BIOGEL PI INDICATOR 6.5 (GLOVE)
GLOVE BIOGEL PI INDICATOR 7.0 (GLOVE)
GLOVE INDICATOR 6.5 STRL GRN (GLOVE) ×2 IMPLANT
GOWN PREVENTION PLUS XXLARGE (GOWN DISPOSABLE) ×1 IMPLANT
KIT BASIN OR (CUSTOM PROCEDURE TRAY) ×1 IMPLANT
NS IRRIG 1000ML POUR BTL (IV SOLUTION) ×1 IMPLANT
PENCIL BUTTON HOLSTER BLD 10FT (ELECTRODE) IMPLANT
POUCH SPECIMEN RETRIEVAL 10MM (ENDOMECHANICALS) ×1 IMPLANT
RELOAD 45 VASCULAR/THIN (ENDOMECHANICALS) IMPLANT
RELOAD STAPLE 45 2.5 WHT GRN (ENDOMECHANICALS) ×1 IMPLANT
RELOAD STAPLE 45 3.5 BLU ETS (ENDOMECHANICALS) ×1 IMPLANT
RELOAD STAPLE TA45 3.5 REG BLU (ENDOMECHANICALS) IMPLANT
SCALPEL HARMONIC ACE (MISCELLANEOUS) IMPLANT
SET IRRIG TUBING LAPAROSCOPIC (IRRIGATION / IRRIGATOR) ×1 IMPLANT
SOLUTION ANTI FOG 6CC (MISCELLANEOUS) ×1 IMPLANT
STRIP CLOSURE SKIN 1/2X4 (GAUZE/BANDAGES/DRESSINGS) ×1 IMPLANT
SUT MNCRL AB 4-0 PS2 18 (SUTURE) ×1 IMPLANT
SUT VICRYL 0 ENDOLOOP (SUTURE) IMPLANT
TOWEL OR 17X26 10 PK STRL BLUE (TOWEL DISPOSABLE) ×1 IMPLANT
TRAY FOLEY CATH 14FRSI W/METER (CATHETERS) IMPLANT
TRAY LAP CHOLE (CUSTOM PROCEDURE TRAY) ×1 IMPLANT
TROCAR BLADELESS OPT 5 75 (ENDOMECHANICALS) ×2 IMPLANT
TROCAR XCEL BLUNT TIP 100MML (ENDOMECHANICALS) ×1 IMPLANT
TROCAR XCEL NON-BLD 11X100MML (ENDOMECHANICALS) IMPLANT
TUBING INSUFFLATION 10FT LAP (TUBING) ×1 IMPLANT

## 2011-10-14 NOTE — Interval H&P Note (Signed)
History and Physical Interval Note:  10/14/2011 8:41 AM  Justin Porter  has presented today for surgery, with the diagnosis of Ruptured appendicitis with abscess  The various methods of treatment have been discussed with the patient and family. After consideration of risks, benefits and other options for treatment, the patient has consented to  Procedure(s) (LRB): APPENDECTOMY LAPAROSCOPIC (N/A) as a surgical intervention .  The patients' history has been reviewed, patient examined, no change in status, stable for surgery.  I have reviewed the patients' chart and labs.  Questions were answered to the patient's satisfaction.     Korrey Schleicher

## 2011-10-14 NOTE — Preoperative (Signed)
Beta Blockers   Reason not to administer Beta Blockers:Not Applicable 

## 2011-10-14 NOTE — H&P (View-Only) (Signed)
HISTORY: Pt is s/p perc drain for abscess from presumed ruptured appendicitis.  He continues to have LLQ pain that did not change even with the drain.  He had a CT last week demonstrating resolution of abscess, yet continues to have severe left sided pain.  He has not had nausea or vomiting.  He denies fevers and chills.     PERTINENT REVIEW OF SYSTEMS: Very hard of hearing.   EXAM: Head: Normocephalic and atraumatic.  Eyes:  Conjunctivae are normal. Pupils are equal, round, and reactive to light. No scleral icterus.  Neck:  Normal range of motion. Neck supple. No tracheal deviation present. No thyromegaly present.  Resp: No respiratory distress, normal effort. Abd:  Abdomen is soft, non distended, tender in the LLQ.  No evidence of inguinal hernia.  No cellulitis. No masses are palpable.  There is no rebound and no guarding.  Neurological: Alert and oriented to person, place, and time. Coordination normal. Difficulty with hearing. Skin: Skin is warm and dry. No rash noted. No diaphoretic. No erythema. No pallor.  Psychiatric: Normal mood and affect. Normal behavior. Judgment and thought content normal.      ASSESSMENT AND PLAN:   Ruptured appendicitis Refill cipro and flagyl Refill pain medication I do not understand why he still has significant left sided pain.   It has not gotten better or worse, but still is severe. CT 4 days ago does not show any recurrent abscess. Will plan interval appendectomy and laparoscopic evaluation of left side.          Edia Pursifull L Kaelon Weekes, MD Surgical Oncology, General & Endocrine Surgery [facility]  [name],KARRAR, MD, MD [name], Karrar, MD   

## 2011-10-14 NOTE — Op Note (Signed)
Interval laparoscopic appendectomy aborted due to new onset atrial fibrillation

## 2011-10-14 NOTE — Anesthesia Preprocedure Evaluation (Signed)
Anesthesia Evaluation  Patient identified by MRN, date of birth, ID band Patient awake    Reviewed: Allergy & Precautions, H&P , NPO status , Patient's Chart, lab work & pertinent test results  Airway Mallampati: II TM Distance: >3 FB Neck ROM: Full    Dental No notable dental hx.    Pulmonary  H/o lung cancer s/p lobectomy. breath sounds clear to auscultation  Pulmonary exam normal       Cardiovascular hypertension, Pt. on medications Rhythm:Regular Rate:Normal     Neuro/Psych negative neurological ROS  negative psych ROS   GI/Hepatic negative GI ROS, Neg liver ROS,   Endo/Other  negative endocrine ROS  Renal/GU negative Renal ROS  negative genitourinary   Musculoskeletal negative musculoskeletal ROS (+)   Abdominal   Peds negative pediatric ROS (+)  Hematology negative hematology ROS (+)   Anesthesia Other Findings   Reproductive/Obstetrics negative OB ROS                           Anesthesia Physical Anesthesia Plan  ASA: III  Anesthesia Plan: General   Post-op Pain Management:    Induction: Intravenous  Airway Management Planned: Oral ETT  Additional Equipment:   Intra-op Plan:   Post-operative Plan: Extubation in OR  Informed Consent: I have reviewed the patients History and Physical, chart, labs and discussed the procedure including the risks, benefits and alternatives for the proposed anesthesia with the patient or authorized representative who has indicated his/her understanding and acceptance.   Dental advisory given  Plan Discussed with: CRNA  Anesthesia Plan Comments:         Anesthesia Quick Evaluation

## 2011-10-14 NOTE — Anesthesia Postprocedure Evaluation (Signed)
  Anesthesia Post-op Note  Patient: Justin Porter  Procedure(s) Performed: Procedure(s) (LRB): APPENDECTOMY LAPAROSCOPIC (N/A)  Patient Location: PACU  Anesthesia Type: Procedure cancelled due to new atrial fibrillation, rate 138.  Level of Consciousness: awake and alert   Airway and Oxygen Therapy: Patient Spontanous Breathing     Post-op Vital Signs: Diltiazem load and infusion started. Cardiology consulted per Dr. Donell Beers, Cardiac enzymes pending.  Wife notified by Dr. Donell Beers. Denies symptoms nor complaints.

## 2011-10-14 NOTE — Transfer of Care (Signed)
Immediate Anesthesia Transfer of Care Note  Patient: Justin Porter  Procedure(s) Performed: Procedure(s) (LRB): APPENDECTOMY LAPAROSCOPIC (N/A)  Patient Location: PACU  Anesthesia Type: Cancelled procedure  Level of Consciousness: awake  Airway & Oxygen Therapy: Patient Spontanous Breathing  Post-op Assessment: Report given to PACU RN  Post vital signs: Reviewed and stable  Complications: No apparent anesthesia complications

## 2011-10-14 NOTE — Progress Notes (Signed)
Pt converted to NSR at 1545. Cardizem gtt at 5 mg/hr. Anthonette Legato

## 2011-10-14 NOTE — OR Nursing (Signed)
Case canceled prior to induction due to atrial fibrillation

## 2011-10-14 NOTE — Progress Notes (Signed)
12lead EKG done. Dr. Donell Beers checked on pt. Updated pt status comfortable without symptoms. Cardiology to see.

## 2011-10-14 NOTE — Progress Notes (Signed)
Continues with afib. HR 100-110 with Cardiazem drip at 5mg /min. Dr. Council Mechanic in and aware. Waiting for Cardiology.

## 2011-10-14 NOTE — Progress Notes (Signed)
Pt w  bil leg and ankle edema. Right worse than left. Faint pedal pulse w 1+ edema. Left w strong pulses

## 2011-10-14 NOTE — Consult Note (Addendum)
Admit date: 10/14/2011 Referring Physician  Dr. Donell Beers Primary Physician  Dr. Eula Listen Primary Cardiologist  Eldridge Dace Reason for Consultation  Atrial fibrillation, preoperative evaluation  HPI: 78 y/o who was scheduled for a lap appendectomy earlier today.  He was found to be in AFib when he got to the OR.  The operation was cancelled.  Prior to this, he has not had any cardiac issues.  He has never had a stress test.  He has been very active up until 6 weeks ago when he had a ruptured appendix, abscess and subsequent drain.  He has residual abdominal pain and this was to be treated with appendectomy.  He was started on IV diltiazem and converted to NSR at about 3:45 PM.  Echo was ordered but has not been done.  He had no symptoms with the AFib.  Even in the past few days, he has walked on his treadmill without difficulty and certainly without chest pain ro SHOB.        PMH:   Past Medical History  Diagnosis Date  . Arthritis   . Hyperlipidemia   . Hypertension   . Hearing loss   . Abdominal pain   . Leg swelling   . Cancer     lung - had 1/3 of left lung removed, did not require additional treatment  . Radiation 18 yrs ago    for increased psa  . LLQ abdominal pain      PSH:   Past Surgical History  Procedure Date  . Elbow surgery     left  surgery for fx 3 yrs ago, right done 40 yrs ago  . Lobectomy 2005  . Prostate surgery 1995  . Lung biopsy 2005    resulted in 1/3 of left lung being removed  . Back surgery 4-5 yrs ago    lower back  . Left knee replacement feb 2012  . Hernia repair 2003    Allergies:  Codeine and Shrimp Prior to Admit Meds:   Prescriptions prior to admission  Medication Sig Dispense Refill  . ciprofloxacin (CIPRO) 500 MG tablet Take 500 mg by mouth 2 (two) times daily.      . furosemide (LASIX) 20 MG tablet Take 20 mg by mouth 2 (two) times daily.      . metroNIDAZOLE (FLAGYL) 500 MG tablet Take 500 mg by mouth every 6 (six) hours.      . Multiple  Vitamin (MULITIVITAMIN WITH MINERALS) TABS Take 1 tablet by mouth every morning.      . Omega-3 Fatty Acids (FISH OIL) 1200 MG CAPS Take 1 capsule by mouth daily.       Marland Kitchen oxyCODONE (OXY IR/ROXICODONE) 5 MG immediate release tablet Take 5-10 mg by mouth every 6 (six) hours as needed. Pain       . Potassium 99 MG TABS Take 1 tablet by mouth every morning.      . simvastatin (ZOCOR) 20 MG tablet Take 20 mg by mouth every evening.        Fam HX:   History reviewed. No pertinent family history. Social HX:    History   Social History  . Marital Status: Married    Spouse Name: N/A    Number of Children: N/A  . Years of Education: N/A   Occupational History  . Not on file.   Social History Main Topics  . Smoking status: Never Smoker   . Smokeless tobacco: Never Used  . Alcohol Use: No  . Drug Use: No  .  Sexually Active: Yes   Other Topics Concern  . Not on file   Social History Narrative  . No narrative on file     ROS:  All 11 ROS were addressed and are negative except what is stated in the HPI  Physical Exam: Blood pressure 108/53, pulse 59, temperature 99.8 F (37.7 C), temperature source Oral, resp. rate 18, height 6' (1.829 m), weight 77.5 kg (170 lb 13.7 oz), SpO2 99.00%.  General: Well developed, well nourished, in no acute distress Head:   Normal cephalic and atraumatic  Lungs:   No wheezing Heart:   HRRR S1 S2             No carotid bruit. No JVD.  Abdomen: Bowel sounds are positive, abdomen soft, mild lower abdominam tenderness                  Msk:  Back normal, Normal strength and tone for age. Extremities:   No clubbing, cyanosis or edema.  DP +1 Neuro: Alert and oriented X 3. Psych:  Good affect, responds appropriately    Labs:   Lab Results  Component Value Date   WBC 4.8 10/14/2011   HGB 13.3 10/14/2011   HCT 39.4 10/14/2011   MCV 94.5 10/14/2011   PLT 284 10/14/2011    Lab 10/14/11 1425 10/08/11 1040  NA -- 135  K -- 4.0  CL -- 98  CO2 -- 31    BUN -- 7  CREATININE 0.67 --  CALCIUM -- 9.2  PROT -- --  BILITOT -- --  ALKPHOS -- --  ALT -- --  AST -- --  GLUCOSE -- 89   No results found for this basename: PTT   Lab Results  Component Value Date   INR 1.12 10/08/2011   INR 1.07 09/04/2010   Lab Results  Component Value Date   CKTOTAL 104 10/14/2011   CKMB 3.2 10/14/2011   TROPONINI <0.30 10/14/2011     No results found for this basename: CHOL   No results found for this basename: HDL   No results found for this basename: LDLCALC   No results found for this basename: TRIG   No results found for this basename: CHOLHDL   No results found for this basename: LDLDIRECT      Radiology:  No results found.  EKG:  3/27, 9 AM: AFib, RVR, lateral ST depressions Current ECG shows NSR, no ST segment changes  ASSESSMENT: New onset paroxysmal AFib; preoperative evaluation  PLAN:  1)AFIB: Spontaneously converted to NSR.  Now off of Diltiazem due to bradycardia.  AFib is likely related to age, and stress of recent events.  Will plan for echo  to evaluate for structural heart disease.  Would not delay surgery for echo.  Could use low dose metoprolol, 12.5 mg BID if HR tolerates.  Would not plan on anticoagulation at this time due to the short duration and need for surgery.  Will have the patient wear a 30 day outpatient monitor to look for asymptomatic AFib.  If he does have more AFib, would have to consider coumadin or Xarelto for stroke prevention.  At discharge, would recommend aspirin 81 mg daily if safe from a bleeding standpoint.    2) PREOP EVAL:No ischemia evaluation needed as the patient is very active on his own and has no sx of angina.  Echo ordered but no other testing needed.  If AFib recurs, ok to restart IV diltiazem as was done today.  Discussed extensively with the patient and family.  All questions answered.  Corky Crafts., MD  10/14/2011  8:04 PM

## 2011-10-14 NOTE — Progress Notes (Signed)
Dr. Council Mechanic in to see. Aware of brief conversion to NSR. HR 110-120. Cardiazem increased to 10mg .

## 2011-10-15 ENCOUNTER — Encounter (HOSPITAL_COMMUNITY): Payer: Self-pay | Admitting: Anesthesiology

## 2011-10-15 ENCOUNTER — Encounter (HOSPITAL_COMMUNITY)
Admission: RE | Disposition: A | Discharge status: Skilled Nursing Facility | Payer: Self-pay | Source: Ambulatory Visit | Attending: General Surgery

## 2011-10-15 ENCOUNTER — Inpatient Hospital Stay (HOSPITAL_COMMUNITY): Payer: Medicare Other

## 2011-10-15 ENCOUNTER — Inpatient Hospital Stay (HOSPITAL_COMMUNITY): Payer: Medicare Other | Admitting: Anesthesiology

## 2011-10-15 DIAGNOSIS — K3580 Unspecified acute appendicitis: Secondary | ICD-10-CM

## 2011-10-15 HISTORY — PX: LAPAROSCOPIC APPENDECTOMY: SHX408

## 2011-10-15 LAB — CBC
HCT: 37.4 % — ABNORMAL LOW (ref 39.0–52.0)
Hemoglobin: 12.5 g/dL — ABNORMAL LOW (ref 13.0–17.0)
MCH: 31.9 pg (ref 26.0–34.0)
MCHC: 33.4 g/dL (ref 30.0–36.0)
RBC: 3.92 MIL/uL — ABNORMAL LOW (ref 4.22–5.81)

## 2011-10-15 LAB — BASIC METABOLIC PANEL
CO2: 28 mEq/L (ref 19–32)
Calcium: 8.6 mg/dL (ref 8.4–10.5)
Chloride: 102 mEq/L (ref 96–112)
Creatinine, Ser: 0.75 mg/dL (ref 0.50–1.35)
Glucose, Bld: 101 mg/dL — ABNORMAL HIGH (ref 70–99)

## 2011-10-15 SURGERY — APPENDECTOMY, LAPAROSCOPIC
Anesthesia: General | Site: Abdomen | Wound class: Dirty or Infected

## 2011-10-15 MED ORDER — PROPOFOL 10 MG/ML IV EMUL
INTRAVENOUS | Status: DC | PRN
  Administered 2011-10-15: 130 mg via INTRAVENOUS

## 2011-10-15 MED ORDER — BUPIVACAINE LIPOSOME 1.3 % IJ SUSP
INTRAMUSCULAR | Status: DC | PRN
  Administered 2011-10-15: 20 mL

## 2011-10-15 MED ORDER — BUPIVACAINE LIPOSOME 1.3 % IJ SUSP
20.0000 mL | Freq: Once | INTRAMUSCULAR | Status: DC
  Filled 2011-10-15: qty 20

## 2011-10-15 MED ORDER — INFLUENZA VIRUS VACC SPLIT PF IM SUSP
0.5000 mL | INTRAMUSCULAR | Status: AC
  Filled 2011-10-15: qty 0.5

## 2011-10-15 MED ORDER — ONDANSETRON HCL 4 MG/2ML IJ SOLN
INTRAMUSCULAR | Status: DC | PRN
  Administered 2011-10-15: 4 mg via INTRAVENOUS

## 2011-10-15 MED ORDER — LACTATED RINGERS IV SOLN
INTRAVENOUS | Status: DC
  Administered 2011-10-15: 13:00:00 via INTRAVENOUS

## 2011-10-15 MED ORDER — ROCURONIUM BROMIDE 100 MG/10ML IV SOLN
INTRAVENOUS | Status: DC | PRN
  Administered 2011-10-15: 35 mg via INTRAVENOUS

## 2011-10-15 MED ORDER — SODIUM CHLORIDE 0.9 % IV SOLN
INTRAVENOUS | Status: AC
  Filled 2011-10-15: qty 1

## 2011-10-15 MED ORDER — NEOSTIGMINE METHYLSULFATE 1 MG/ML IJ SOLN
INTRAMUSCULAR | Status: DC | PRN
  Administered 2011-10-15: 5 mg via INTRAVENOUS

## 2011-10-15 MED ORDER — POTASSIUM CHLORIDE 10 MEQ/100ML IV SOLN
10.0000 meq | INTRAVENOUS | Status: AC
  Administered 2011-10-15 (×4): 10 meq via INTRAVENOUS
  Filled 2011-10-15 (×4): qty 100

## 2011-10-15 MED ORDER — GLYCOPYRROLATE 0.2 MG/ML IJ SOLN
INTRAMUSCULAR | Status: DC | PRN
  Administered 2011-10-15: .7 mg via INTRAVENOUS

## 2011-10-15 MED ORDER — LACTATED RINGERS IR SOLN
Status: DC | PRN
  Administered 2011-10-15: 1000 mL

## 2011-10-15 MED ORDER — FENTANYL CITRATE 0.05 MG/ML IJ SOLN
INTRAMUSCULAR | Status: AC
  Filled 2011-10-15: qty 2

## 2011-10-15 MED ORDER — LIDOCAINE HCL (CARDIAC) 20 MG/ML IV SOLN
INTRAVENOUS | Status: DC | PRN
  Administered 2011-10-15: 100 mg via INTRAVENOUS

## 2011-10-15 MED ORDER — MORPHINE SULFATE 2 MG/ML IJ SOLN: 1.0000 mg | INTRAMUSCULAR | Status: DC | PRN

## 2011-10-15 MED ORDER — SODIUM CHLORIDE 0.9 % IV SOLN
1.0000 g | Freq: Once | INTRAVENOUS | Status: AC
  Administered 2011-10-15: 1 g via INTRAVENOUS

## 2011-10-15 MED ORDER — FENTANYL CITRATE 0.05 MG/ML IJ SOLN
25.0000 ug | INTRAMUSCULAR | Status: DC | PRN
  Administered 2011-10-15 (×2): 50 ug via INTRAVENOUS

## 2011-10-15 MED ORDER — 0.9 % SODIUM CHLORIDE (POUR BTL) OPTIME
TOPICAL | Status: DC | PRN
  Administered 2011-10-15: 1000 mL

## 2011-10-15 MED ORDER — FENTANYL CITRATE 0.05 MG/ML IJ SOLN
INTRAMUSCULAR | Status: DC | PRN
  Administered 2011-10-15 (×4): 50 ug via INTRAVENOUS

## 2011-10-15 MED ORDER — DEXAMETHASONE SODIUM PHOSPHATE 10 MG/ML IJ SOLN
INTRAMUSCULAR | Status: DC | PRN
  Administered 2011-10-15: 10 mg via INTRAVENOUS

## 2011-10-15 MED ORDER — PNEUMOCOCCAL VAC POLYVALENT 25 MCG/0.5ML IJ INJ
0.5000 mL | INJECTION | INTRAMUSCULAR | Status: AC
  Administered 2011-10-16: 0.5 mL via INTRAMUSCULAR
  Filled 2011-10-15: qty 0.5

## 2011-10-15 SURGICAL SUPPLY — 52 items
ADH SKN CLS APL DERMABOND .7 (GAUZE/BANDAGES/DRESSINGS) ×1
APL SKNCLS STERI-STRIP NONHPOA (GAUZE/BANDAGES/DRESSINGS)
APPLIER CLIP ROT 10 11.4 M/L (STAPLE) ×2
APR CLP MED LRG 11.4X10 (STAPLE) ×1
BAG SPEC RTRVL LRG 6X4 10 (ENDOMECHANICALS) ×1
BENZOIN TINCTURE PRP APPL 2/3 (GAUZE/BANDAGES/DRESSINGS) ×1 IMPLANT
CANISTER SUCTION 2500CC (MISCELLANEOUS) ×2 IMPLANT
CLIP APPLIE ROT 10 11.4 M/L (STAPLE) IMPLANT
CLOTH BEACON ORANGE TIMEOUT ST (SAFETY) ×2 IMPLANT
COVER SURGICAL LIGHT HANDLE (MISCELLANEOUS) ×1 IMPLANT
CUTTER FLEX LINEAR 45M (STAPLE) ×1 IMPLANT
DECANTER SPIKE VIAL GLASS SM (MISCELLANEOUS) ×1 IMPLANT
DERMABOND ADVANCED (GAUZE/BANDAGES/DRESSINGS) ×1
DERMABOND ADVANCED .7 DNX12 (GAUZE/BANDAGES/DRESSINGS) IMPLANT
DRAPE LAPAROSCOPIC ABDOMINAL (DRAPES) ×2 IMPLANT
DRAPE UTILITY XL STRL (DRAPES) ×1 IMPLANT
DRSG TEGADERM 2-3/8X2-3/4 SM (GAUZE/BANDAGES/DRESSINGS) IMPLANT
DRSG TEGADERM 4X4.75 (GAUZE/BANDAGES/DRESSINGS) IMPLANT
ELECT REM PT RETURN 9FT ADLT (ELECTROSURGICAL) ×2
ELECTRODE REM PT RTRN 9FT ADLT (ELECTROSURGICAL) ×1 IMPLANT
ENDOLOOP SUT PDS II  0 18 (SUTURE)
ENDOLOOP SUT PDS II 0 18 (SUTURE) IMPLANT
GLOVE BIO SURGEON STRL SZ 6 (GLOVE) ×4 IMPLANT
GLOVE BIOGEL PI IND STRL 6.5 (GLOVE) ×1 IMPLANT
GLOVE BIOGEL PI IND STRL 7.0 (GLOVE) ×1 IMPLANT
GLOVE BIOGEL PI INDICATOR 6.5 (GLOVE) ×1
GLOVE BIOGEL PI INDICATOR 7.0 (GLOVE) ×1
GLOVE INDICATOR 6.5 STRL GRN (GLOVE) ×4 IMPLANT
GOWN PREVENTION PLUS XXLARGE (GOWN DISPOSABLE) ×2 IMPLANT
KIT BASIN OR (CUSTOM PROCEDURE TRAY) ×2 IMPLANT
NS IRRIG 1000ML POUR BTL (IV SOLUTION) ×2 IMPLANT
PENCIL BUTTON HOLSTER BLD 10FT (ELECTRODE) IMPLANT
POUCH SPECIMEN RETRIEVAL 10MM (ENDOMECHANICALS) ×2 IMPLANT
RELOAD 45 VASCULAR/THIN (ENDOMECHANICALS) IMPLANT
RELOAD STAPLE 45 2.5 WHT GRN (ENDOMECHANICALS) ×1 IMPLANT
RELOAD STAPLE 45 3.5 BLU ETS (ENDOMECHANICALS) ×1 IMPLANT
RELOAD STAPLE TA45 3.5 REG BLU (ENDOMECHANICALS) ×2 IMPLANT
SCALPEL HARMONIC ACE (MISCELLANEOUS) ×1 IMPLANT
SET IRRIG TUBING LAPAROSCOPIC (IRRIGATION / IRRIGATOR) ×2 IMPLANT
SOLUTION ANTI FOG 6CC (MISCELLANEOUS) ×2 IMPLANT
STRIP CLOSURE SKIN 1/2X4 (GAUZE/BANDAGES/DRESSINGS) ×1 IMPLANT
SUT MNCRL AB 4-0 PS2 18 (SUTURE) ×2 IMPLANT
SUT VICRYL 0 ENDOLOOP (SUTURE) IMPLANT
TOWEL OR 17X26 10 PK STRL BLUE (TOWEL DISPOSABLE) ×2 IMPLANT
TOWEL OR NON WOVEN STRL DISP B (DISPOSABLE) ×1 IMPLANT
TRAY FOLEY CATH 14FRSI W/METER (CATHETERS) ×1 IMPLANT
TRAY LAP CHOLE (CUSTOM PROCEDURE TRAY) ×2 IMPLANT
TROCAR BLADELESS OPT 5 75 (ENDOMECHANICALS) ×6 IMPLANT
TROCAR XCEL 12X100 BLDLESS (ENDOMECHANICALS) ×1 IMPLANT
TROCAR XCEL BLUNT TIP 100MML (ENDOMECHANICALS) ×2 IMPLANT
TROCAR XCEL NON-BLD 11X100MML (ENDOMECHANICALS) IMPLANT
TUBING INSUFFLATION 10FT LAP (TUBING) ×2 IMPLANT

## 2011-10-15 NOTE — Anesthesia Postprocedure Evaluation (Signed)
  Anesthesia Post-op Note  Patient: Justin Porter  Procedure(s) Performed: Procedure(s) (LRB): APPENDECTOMY LAPAROSCOPIC (N/A)  Patient Location: PACU  Anesthesia Type: General  Level of Consciousness: awake and alert   Airway and Oxygen Therapy: Patient Spontanous Breathing  Post-op Pain: mild  Post-op Assessment: Post-op Vital signs reviewed, Patient's Cardiovascular Status Stable, Respiratory Function Stable, Patent Airway and No signs of Nausea or vomiting  Post-op Vital Signs: stable  Complications: No apparent anesthesia complications

## 2011-10-15 NOTE — Op Note (Signed)
Laparoscopic appendectomy, Procedure Note  Indications: The patient presented with acute appendicitis with rupture and abscess 09/15/2011.  He had abscess drain and resolution of abscess, but he continued to have significant pain despite being on antibiotics.    Pre-operative Diagnosis: Chronic appendicitis with peritoneal abscess  Post-operative Diagnosis: Same  Surgeon: Almond Lint   Anesthesia: General endotracheal anesthesia and Local anesthesia Exparel  ASA Class: 3   Procedure Details  The patient was seen again in the Holding Room. The risks, benefits, complications, treatment options, and expected outcomes were discussed with the patient and/or family. There was concurrence with the proposed plan and informed consent was obtained. The site of surgery was properly noted. The patient was taken to Operating Room, identified, and the procedure verified as laparoscopic appendectomy. A Time Out was held and the above information confirmed.  The patient was placed in the supine position and general anesthesia was induced, along with placement of orogastric tube, pneumatic compression devices and a Foley catheter. The abdomen was prepped and draped in a sterile fashion. Local anesthetic was administered to the subcutaneous tissues.  A 5 mm Optiview trocar was placed at the left costal margin.  The pneumoperitoneum was then established to steady pressure of 15 mmHg.   Two additional 5 mm trocars were then placed in the left lower quadrant of the abdomen and in the suprapubic region under direct vision after administration of local.  A careful evaluation of the entire abdomen was carried out. The patient was placed in Trendelenburg and rotated to the left.  The small intestines were completely adherent to the abdominal wall in the pelvis and right lower quadrant.  There was a large direct right inguinal hernia with small bowel adherent as well.  There was omentum in that location as well.  This  required sharp dissection of the small bowel from the cecum and the abdominal wall.  Once the base of the cecum was exposed, the appendix was identified.  This was elevated.  The end of the appendix had purulent drainage coming out of it.   The subcostal port was upsized to a 12 mm port.    The appendix was carefully dissected. The harmonic scalpel was used to skeletonize the base of the appendix and to divide the mesoappendix. The appendix was divided at its base using an endo-GIA stapler, blue load. Minimal appendiceal stump was left in place. There was no evidence of bleeding, leakage, or complication after division of the appendix. The appendix was retrieved from the abdomen with an endocatch bag.  Irrigation was also performed and irrigate suctioned from the abdomen as well.   The trocar site skin wounds were closed using 4-0 Monocryl.    The patient was awakened from anesthesia and taken to the PACU in stable condition.    Instrument, sponge, and needle counts were correct at the conclusion of the case.   Findings: The appendix was found to be inflamed. There were not signs of necrosis.  There was perforation. There was abscess formation previously, but no acute purulence was seen outside of the appendix.    Estimated Blood Loss:  less than 50 mL         Drains: None          Specimens: appendix to pathology         Complications:  None; patient tolerated the procedure well.         Disposition: PACU - hemodynamically stable.         Condition:  stable

## 2011-10-15 NOTE — Transfer of Care (Signed)
Immediate Anesthesia Transfer of Care Note  Patient: Justin Porter  Procedure(s) Performed: Procedure(s) (LRB): APPENDECTOMY LAPAROSCOPIC (N/A)  Patient Location: PACU  Anesthesia Type: General  Level of Consciousness: sedated, patient cooperative and responds to stimulaton  Airway & Oxygen Therapy: Patient Spontanous Breathing and Patient connected to face mask oxgen  Post-op Assessment: Report given to PACU RN and Post -op Vital signs reviewed and stable  Post vital signs: Reviewed and stable  Complications: No apparent anesthesia complications

## 2011-10-15 NOTE — Progress Notes (Signed)
He was in the operating room when I attempted to make rounds this mid afternoon. Please call if we may be of assistance.

## 2011-10-15 NOTE — Progress Notes (Signed)
*  PRELIMINARY RESULTS* Echocardiogram 2D Echocardiogram has been performed.  Jeryl Columbia R 10/15/2011, 9:15 AM

## 2011-10-15 NOTE — Plan of Care (Signed)
Problem: Phase I Progression Outcomes Goal: Anticoagulation Therapy per MD order Outcome: Not Applicable Date Met:  10/15/11 Cardiologist did not recommend anticoagulation therapy for the pt stating that the afib did not last long enough to cause concern for clots. Pt is on subQ heparin for VTE prophylaxis.  Goal: Heart rate or rhythm control medication Outcome: Completed/Met Date Met:  10/15/11 Cardizem gtt was initiated but has been off since around 7pm 10/14/11. Patient converted to NSR around 15:45 10/14/11 and has remained in NSR since that time.   Problem: Phase II Progression Outcomes Goal: Discharge plan established Outcome: Completed/Met Date Met:  10/15/11 Pt awaiting surgery unrelated to A fib, to be discharged afterwards.   Problem: Phase III Progression Outcomes Goal: Discharge plan remains appropriate-arrangements made Outcome: Completed/Met Date Met:  10/15/11 Will reassess as needed. Cardiologist recommended patient being on a home monitor for 30 days to assess for further A fib.

## 2011-10-15 NOTE — Plan of Care (Signed)
Atrial Fibrillation pathway completed due to pt converting to NSR with cardiologist suggesting no further action be taken in regards to the Afib other than monitoring. Surgical pathway initiated- patient scheduled for surgery today.

## 2011-10-15 NOTE — Progress Notes (Signed)
Patient talked about how he was scheduled for surgery and then ended up back in the ICU yesterday. He requested prayer about the surgery today. Prayed. Brought him a blessing heart to hold to ease his appropriate concerns.

## 2011-10-15 NOTE — Progress Notes (Signed)
Day of Surgery  Subjective: Cleared by cardiology.  Converted spontaneously to NSR.    Objective: Vital signs in last 24 hours: Temp:  [97.3 F (36.3 C)-99.8 F (37.7 C)] 97.3 F (36.3 C) (03/28 0822) Pulse Rate:  [59-97] 70  (03/28 1157) Resp:  [14-25] 19  (03/28 1157) BP: (94-147)/(46-70) 147/61 mmHg (03/28 1157) SpO2:  [95 %-99 %] 97 % (03/28 1157) Last BM Date: 10/15/11  Intake/Output from previous day: 03/27 0701 - 03/28 0700 In: 2230 [P.O.:360; I.V.:1760; IV Piggyback:110] Out: 900 [Urine:900] Intake/Output this shift: Total I/O In: 500 [I.V.:300; IV Piggyback:200] Out: -   General appearance: alert, cooperative and no distress GI: soft, tender suprapubic location. non distended  Lab Results:   Basename 10/15/11 0345 10/14/11 1425  WBC 5.0 4.8  HGB 12.5* 13.3  HCT 37.4* 39.4  PLT 279 284   BMET  Basename 10/15/11 0345 10/14/11 1425  NA 137 --  K 3.3* --  CL 102 --  CO2 28 --  GLUCOSE 101* --  BUN 6 --  CREATININE 0.75 0.67  CALCIUM 8.6 --   PT/INR No results found for this basename: LABPROT:2,INR:2 in the last 72 hours ABG No results found for this basename: PHART:2,PCO2:2,PO2:2,HCO3:2 in the last 72 hours  Studies/Results: Dg Chest Port 1 View  10/15/2011  *RADIOLOGY REPORT*  Clinical Data: Vascular congestion.  PORTABLE CHEST - 1 VIEW  Comparison: 10/08/2011  Findings: Heart is normal size.  Left lung clear.  Increased density projecting in the right apex is felt to be related to the medial clavicle and anterior first rib.  No focal opacities.  No effusions.  IMPRESSION: No acute findings.  No change.  Original Report Authenticated By: Cyndie Chime, M.D.    Anti-infectives: Anti-infectives     Start     Dose/Rate Route Frequency Ordered Stop   10/14/11 1400   ciprofloxacin (CIPRO) tablet 500 mg        500 mg Oral 2 times daily 10/14/11 1254     10/14/11 1400   metroNIDAZOLE (FLAGYL) tablet 500 mg        500 mg Oral Every 6 hours 10/14/11  1254     10/14/11 0613   ertapenem (INVANZ) 1 g in sodium chloride 0.9 % 50 mL IVPB        1 g 100 mL/hr over 30 Minutes Intravenous 60 min pre-op 10/14/11 4782 10/14/11 0843          Assessment/Plan: s/p Procedure(s) (LRB): APPENDECTOMY LAPAROSCOPIC (N/A) lap interval appendectomy.  LOS: 1 day    [facility] 10/15/2011

## 2011-10-15 NOTE — Progress Notes (Signed)
Spouse in pacu to place bilateral hearing aids in patients ears.

## 2011-10-15 NOTE — Anesthesia Preprocedure Evaluation (Signed)
Anesthesia Evaluation    Airway       Dental  (+) Dental Advisory Given   Pulmonary    Pulmonary exam normal       Cardiovascular hypertension, + dysrhythmias Atrial Fibrillation  New Atrial Fib. Yesterday. Seen by cardiology   Neuro/Psych    GI/Hepatic   Endo/Other    Renal/GU      Musculoskeletal   Abdominal   Peds  Hematology   Anesthesia Other Findings   Reproductive/Obstetrics                           Anesthesia Physical Anesthesia Plan  ASA: III  Anesthesia Plan: General   Post-op Pain Management:    Induction: Intravenous  Airway Management Planned:   Additional Equipment:   Intra-op Plan:   Post-operative Plan: Extubation in OR  Informed Consent: I have reviewed the patients History and Physical, chart, labs and discussed the procedure including the risks, benefits and alternatives for the proposed anesthesia with the patient or authorized representative who has indicated his/her understanding and acceptance.   Dental advisory given  Plan Discussed with: CRNA and Surgeon  Anesthesia Plan Comments:         Anesthesia Quick Evaluation

## 2011-10-16 ENCOUNTER — Encounter (HOSPITAL_COMMUNITY): Payer: Self-pay | Admitting: General Surgery

## 2011-10-16 LAB — CBC
Hemoglobin: 12.5 g/dL — ABNORMAL LOW (ref 13.0–17.0)
MCH: 31.5 pg (ref 26.0–34.0)
MCV: 95.7 fL (ref 78.0–100.0)
Platelets: 255 10*3/uL (ref 150–400)
RBC: 3.97 MIL/uL — ABNORMAL LOW (ref 4.22–5.81)
WBC: 7.4 10*3/uL (ref 4.0–10.5)

## 2011-10-16 LAB — BASIC METABOLIC PANEL
CO2: 30 mEq/L (ref 19–32)
Chloride: 100 mEq/L (ref 96–112)
Creatinine, Ser: 0.74 mg/dL (ref 0.50–1.35)
Glucose, Bld: 98 mg/dL (ref 70–99)

## 2011-10-16 NOTE — Progress Notes (Signed)
Currently in normal sinus rhythm. No further evidence of atrial fibrillation. Postop day 1. Appendicitis.  Echocardiogram demonstrated normal EF.  Please let us know if we can be of further assistance, we'll sign off.

## 2011-10-16 NOTE — Progress Notes (Signed)
Cardizem gtt orders clarified. Orders written.

## 2011-10-16 NOTE — Progress Notes (Signed)
Patient ID: Justin Porter, male   DOB: January 07, 1929, 78 y.o.   MRN: [id number] [facility] Progress Note:   1 Day Post-Op  Subjective: Mental status is clear Objective: Vital signs in last 24 hours: Temp:  [97.3 F (36.3 C)-98.8 F (37.1 C)] 98.2 F (36.8 C) (03/29 0811) Pulse Rate:  [66-92] 75  (03/29 0900) Resp:  [17-24] 20  (03/29 0900) BP: (123-165)/(63-87) 134/70 mmHg (03/29 0900) SpO2:  [96 %-100 %] 96 % (03/29 0900) Weight:  [169 lb 5 oz (76.8 kg)] 169 lb 5 oz (76.8 kg) (03/29 0400)  Intake/Output from previous day: 03/28 0701 - 03/29 0700 In: 3560 [P.O.:1050; I.V.:2300; IV Piggyback:210] Out: 2275 [Urine:2275] Intake/Output this shift: Total I/O In: 465 [P.O.:240; I.V.:225] Out: -   Physical Exam: Work of breathing is  Not labored  Lab Results:  Results for orders placed during the hospital encounter of 10/14/11 (from the past 48 hour(s))  CBC     Status: Abnormal   Collection Time   10/14/11  2:25 PM      Component Value Range Comment   WBC 4.8  4.0 - 10.5 (K/uL)    RBC 4.17 (*) 4.22 - 5.81 (MIL/uL)    Hemoglobin 13.3  13.0 - 17.0 (g/dL)    HCT 08.6  57.8 - 46.9 (%)    MCV 94.5  78.0 - 100.0 (fL)    MCH 31.9  26.0 - 34.0 (pg)    MCHC 33.8  30.0 - 36.0 (g/dL)    RDW 62.9  52.8 - 41.3 (%)    Platelets 284  150 - 400 (K/uL)   CREATININE, SERUM     Status: Abnormal   Collection Time   10/14/11  2:25 PM      Component Value Range Comment   Creatinine, Ser 0.67  0.50 - 1.35 (mg/dL)    GFR calc non Af Amer 88 (*) >90 (mL/min)    GFR calc Af Amer >90  >90 (mL/min)   BASIC METABOLIC PANEL     Status: Abnormal   Collection Time   10/15/11  3:45 AM      Component Value Range Comment   Sodium 137  135 - 145 (mEq/L)    Potassium 3.3 (*) 3.5 - 5.1 (mEq/L)    Chloride 102  96 - 112 (mEq/L)    CO2 28  19 - 32 (mEq/L)    Glucose, Bld 101 (*) 70 - 99 (mg/dL)    BUN 6  6 - 23 (mg/dL)    Creatinine, Ser 2.44  0.50 - 1.35 (mg/dL)    Calcium 8.6  8.4 - 10.5  (mg/dL)    GFR calc non Af Amer 84 (*) >90 (mL/min)    GFR calc Af Amer >90  >90 (mL/min)   MAGNESIUM     Status: Normal   Collection Time   10/15/11  3:45 AM      Component Value Range Comment   Magnesium 2.0  1.5 - 2.5 (mg/dL)   PHOSPHORUS     Status: Normal   Collection Time   10/15/11  3:45 AM      Component Value Range Comment   Phosphorus 3.3  2.3 - 4.6 (mg/dL)   CBC     Status: Abnormal   Collection Time   10/15/11  3:45 AM      Component Value Range Comment   WBC 5.0  4.0 - 10.5 (K/uL) WHITE COUNT CONFIRMED ON SMEAR   RBC 3.92 (*) 4.22 - 5.81 (MIL/uL)  Hemoglobin 12.5 (*) 13.0 - 17.0 (g/dL)    HCT 40.9 (*) 81.1 - 52.0 (%)    MCV 95.4  78.0 - 100.0 (fL)    MCH 31.9  26.0 - 34.0 (pg)    MCHC 33.4  30.0 - 36.0 (g/dL)    RDW 91.4  78.2 - 95.6 (%)    Platelets 279  150 - 400 (K/uL)   CBC     Status: Abnormal   Collection Time   10/16/11  3:40 AM      Component Value Range Comment   WBC 7.4  4.0 - 10.5 (K/uL)    RBC 3.97 (*) 4.22 - 5.81 (MIL/uL)    Hemoglobin 12.5 (*) 13.0 - 17.0 (g/dL)    HCT 21.3 (*) 08.6 - 52.0 (%)    MCV 95.7  78.0 - 100.0 (fL)    MCH 31.5  26.0 - 34.0 (pg)    MCHC 32.9  30.0 - 36.0 (g/dL)    RDW 57.8  46.9 - 62.9 (%)    Platelets 255  150 - 400 (K/uL)   BASIC METABOLIC PANEL     Status: Abnormal   Collection Time   10/16/11  3:40 AM      Component Value Range Comment   Sodium 134 (*) 135 - 145 (mEq/L)    Potassium 3.5  3.5 - 5.1 (mEq/L)    Chloride 100  96 - 112 (mEq/L)    CO2 30  19 - 32 (mEq/L)    Glucose, Bld 98  70 - 99 (mg/dL)    BUN 5 (*) 6 - 23 (mg/dL)    Creatinine, Ser 5.28  0.50 - 1.35 (mg/dL)    Calcium 8.1 (*) 8.4 - 10.5 (mg/dL)    GFR calc non Af Amer 85 (*) >90 (mL/min)    GFR calc Af Amer >90  >90 (mL/min)     Radiology/Results: Dg Chest Port 1 View  10/15/2011  *RADIOLOGY REPORT*  Clinical Data: Vascular congestion.  PORTABLE CHEST - 1 VIEW  Comparison: 10/08/2011  Findings: Heart is normal size.  Left lung clear.   Increased density projecting in the right apex is felt to be related to the medial clavicle and anterior first rib.  No focal opacities.  No effusions.  IMPRESSION: No acute findings.  No change.  Original Report Authenticated By: Cyndie Chime, M.D.    Anti-infectives: Anti-infectives     Start     Dose/Rate Route Frequency Ordered Stop   10/15/11 1315   ertapenem (INVANZ) 1 g in sodium chloride 0.9 % 50 mL IVPB        1 g 100 mL/hr over 30 Minutes Intravenous  Once 10/15/11 1301 10/15/11 1331   10/14/11 1400   ciprofloxacin (CIPRO) tablet 500 mg        500 mg Oral 2 times daily 10/14/11 1254     10/14/11 1400   metroNIDAZOLE (FLAGYL) tablet 500 mg        500 mg Oral Every 6 hours 10/14/11 1254     10/14/11 0613   ertapenem (INVANZ) 1 g in sodium chloride 0.9 % 50 mL IVPB        1 g 100 mL/hr over 30 Minutes Intravenous 60 min pre-op 10/14/11 4132 10/14/11 0843          Assessment/Plan: Problem List: Patient Active Problem List  Diagnoses  . Ruptured appendicitis  . History of prostate cancer  . History of lung cancer  . HTN (hypertension)    Doing well  after lap appy.  Ok to transfer up to floor 1 Day Post-Op    LOS: 2 days   Matt B. Daphine Deutscher, MD, [facility] [phone] beeper [phone]  10/16/2011 12:56 PM

## 2011-10-17 DIAGNOSIS — Z5189 Encounter for other specified aftercare: Secondary | ICD-10-CM | POA: Diagnosis not present

## 2011-10-17 DIAGNOSIS — I130 Hypertensive heart and chronic kidney disease with heart failure and stage 1 through stage 4 chronic kidney disease, or unspecified chronic kidney disease: Secondary | ICD-10-CM | POA: Diagnosis not present

## 2011-10-17 DIAGNOSIS — K3580 Unspecified acute appendicitis: Secondary | ICD-10-CM | POA: Diagnosis not present

## 2011-10-17 DIAGNOSIS — R52 Pain, unspecified: Secondary | ICD-10-CM | POA: Diagnosis not present

## 2011-10-17 DIAGNOSIS — D638 Anemia in other chronic diseases classified elsewhere: Secondary | ICD-10-CM | POA: Diagnosis not present

## 2011-10-17 DIAGNOSIS — R269 Unspecified abnormalities of gait and mobility: Secondary | ICD-10-CM | POA: Diagnosis not present

## 2011-10-17 DIAGNOSIS — E785 Hyperlipidemia, unspecified: Secondary | ICD-10-CM | POA: Diagnosis not present

## 2011-10-17 MED ORDER — OXYCODONE HCL 5 MG PO TABS: 5.0000 mg | ORAL_TABLET | Freq: Four times a day (QID) | ORAL | Status: AC | PRN

## 2011-10-17 NOTE — Progress Notes (Signed)
CSW received call from RN, Alexia Freestone stating that patient is ready to d/c to [facility] today. CSW spoke with [place] @ [facility] who stated patient is ok to be admitted (wife volunteers at SNF), d/c summary faxed to facility & chart copy packet given to wife who will transport patient. FL2 completed, awaiting MD call back to sign FL2.   Unice Bailey, LCSWA [phone]

## 2011-10-17 NOTE — Discharge Summary (Signed)
Physician Discharge Summary  Patient ID: Justin Porter MRN: [id number] DOB/AGE: 03/21/1929 78 y.o.  Admit date: 10/14/2011 Discharge date: 10/17/2011  Admission Diagnoses:  Ruptured appendicitis  Discharge Diagnoses:  Same, post laparoscopic appendectomy  Active Problems:  * No active hospital problems. *    Surgery:  Lap appendectomy  Discharged Condition: improved  Hospital Course:   Had lap appendectomy,  Has done well.  Needing to go to SNF to faciliatate rehab  Consults: none  Significant Diagnostic Studies: none    Discharge Exam: Blood pressure 133/61, pulse 66, temperature 97.8 F (36.6 C), temperature source Oral, resp. rate 18, height 6' (1.829 m), weight 169 lb 5 oz (76.8 kg), SpO2 97.00%. Incisions bland and covered with dermabond  Disposition: 01-Home or Self Care  Discharge Orders    Future Orders Please Complete By Expires   Diet - low sodium heart healthy      Increase activity slowly      No dressing needed        Medication List  As of 10/17/2011  8:17 AM   STOP taking these medications         ciprofloxacin 500 MG tablet      metroNIDAZOLE 500 MG tablet         TAKE these medications         Fish Oil 1200 MG Caps   Take 1 capsule by mouth daily.      furosemide 20 MG tablet   Commonly known as: LASIX   Take 20 mg by mouth 2 (two) times daily.      mulitivitamin with minerals Tabs   Take 1 tablet by mouth every morning.      oxyCODONE 5 MG immediate release tablet   Commonly known as: Oxy IR/ROXICODONE   Take 1-2 tablets (5-10 mg total) by mouth every 6 (six) hours as needed. Pain      Potassium 99 MG Tabs   Take 1 tablet by mouth every morning.      simvastatin 20 MG tablet   Commonly known as: ZOCOR   Take 20 mg by mouth every evening.           Follow-up Information    Follow up with [facility], MD in 4 weeks.   Contact information:   [employer] [facility] [address] [place]  [place] [address] [phone]          Signed: Valarie Merino 10/17/2011, 8:17 AM

## 2011-10-17 NOTE — Discharge Instructions (Signed)
Laparoscopic Appendectomy Appendectomy is surgery to remove the appendix. Laparoscopic surgery uses several small cuts (incisions) instead of one large incision. Laparoscopic surgery offers a shorter recovery time and less discomfort. LET YOUR CAREGIVER KNOW ABOUT:  Allergies to food or medicine.   Medicines taken, including vitamins, dietary supplements, herbs, eyedrops, over-the-counter medicines, and creams.   Use of steroids (by mouth or creams).   Previous problems with anesthetics or numbing medicines.   History of bleeding problems or blood clots.   Previous surgery.   Other health problems, including diabetes, heart problems, lung problems, and kidney problems.   Possibility of pregnancy, if this applies.  RISKS AND COMPLICATIONS  Infection. A germ starts growing in the wound. This can usually be treated with antibiotics. In some cases, the wound will need to be opened and cleaned.   Bleeding.   Damage to other organs.   Sores (abscesses).   Chronic pain at the incision sites. This is defined as pain that lasts for more than 3 months.   Blood clots in the legs that may rarely travel to the lungs.   Infection in the lungs (pneumonia).  BEFORE THE PROCEDURE Appendectomy is usually performed immediately after an inflamed appendix (appendicitis) is diagnosed. No preparation is necessary ahead of this procedure. PROCEDURE  You will be given medicine that makes you sleep (general anesthetic). After you are asleep, a flexible tube (catheter) may be inserted into your bladder to drain your urine during surgery. The tube is removed before you wake up after surgery. When you are asleep, carbondioxide gas will be used to inflate your abdomen. This will allow your surgeon to see inside your abdomen and perform your surgery. Three small incisions will be made in your abdomen. Your surgeon will insert a thin, lighted tube (laparoscope) through one of the incisions. Your surgeon will  look through the laparoscope while performing the surgery. Other tools will be inserted through the other incisions. Laparoscopic procedures may not be appropriate when:  There is major scarring from a previous surgery.   The patient has bleeding disorders.   A pregnancy is near term.   There are other conditions which make the laparoscopic procedure impossible, such as an advanced infection or a ruptured appendix.  If your surgeon feels it is not safe to continue with the laparoscopic procedure, he or she will perform an open surgery instead. This gives the surgeon a larger view and more space to work. Open surgery requires a longer recovery time. After your appendix is removed, your incisions will be closed with stitches (sutures) or skin adhesive. AFTER THE PROCEDURE You will be taken to a recovery room. When the anesthesia has worn off, you will be returned to your hospital room. You will be given pain medicines to keep you comfortable. Ask your caregiver how long your hospital stay will be. Document Released: 02/18/2004 Document Revised: 06/25/2011 Document Reviewed: 01/13/2011 [employer] Patient Information 2012 [employer]. 

## 2011-10-19 ENCOUNTER — Telehealth (INDEPENDENT_AMBULATORY_CARE_PROVIDER_SITE_OTHER): Payer: Self-pay | Admitting: General Surgery

## 2011-10-19 NOTE — Telephone Encounter (Signed)
LMOV pt's appt moved up to 11/09/11 at 5:00pm with Dr. Donell Beers.

## 2011-10-23 DIAGNOSIS — N039 Chronic nephritic syndrome with unspecified morphologic changes: Secondary | ICD-10-CM | POA: Diagnosis not present

## 2011-10-23 DIAGNOSIS — E785 Hyperlipidemia, unspecified: Secondary | ICD-10-CM | POA: Diagnosis not present

## 2011-10-23 DIAGNOSIS — D638 Anemia in other chronic diseases classified elsewhere: Secondary | ICD-10-CM | POA: Diagnosis not present

## 2011-10-23 DIAGNOSIS — R269 Unspecified abnormalities of gait and mobility: Secondary | ICD-10-CM | POA: Diagnosis not present

## 2011-10-28 ENCOUNTER — Telehealth (INDEPENDENT_AMBULATORY_CARE_PROVIDER_SITE_OTHER): Payer: Self-pay

## 2011-10-28 DIAGNOSIS — R109 Unspecified abdominal pain: Secondary | ICD-10-CM | POA: Diagnosis not present

## 2011-10-28 NOTE — Telephone Encounter (Signed)
The wife called back to see what advice could be offered from the call this am.  I informed her that Dr Donell Beers said the pain should be getting better by now so she ordered a CBC and BMET.  I instructed the wife on where [facility] is and she will take the pt in the am.

## 2011-10-28 NOTE — Telephone Encounter (Signed)
Dewayne Hatch (patient spouse) called stating patient continues to have an increase in pain on left side, patient continues to lose weight has lost 3 lbs since Sunday.  Patient denies fever, nausea or vomiting and states his bowel movements are normal.  Mr. Heinz having moderate to severe left side pain, he sleeps in a recliner patient not able to sleep in bed. Will send the information to Dr. Donell Beers to review.

## 2011-10-28 NOTE — Telephone Encounter (Signed)
I would get CBC and BMET on pt.  This is unusual/  Pain should be improving.

## 2011-10-29 DIAGNOSIS — R109 Unspecified abdominal pain: Secondary | ICD-10-CM | POA: Diagnosis not present

## 2011-10-29 LAB — CBC WITH DIFFERENTIAL/PLATELET
Eosinophils Absolute: 0.1 10*3/uL (ref 0.0–0.7)
Eosinophils Relative: 1 % (ref 0–5)
HCT: 46.1 % (ref 39.0–52.0)
Lymphocytes Relative: 25 % (ref 12–46)
Lymphs Abs: 1.5 10*3/uL (ref 0.7–4.0)
MCH: 31.8 pg (ref 26.0–34.0)
MCV: 97.7 fL (ref 78.0–100.0)
Monocytes Absolute: 0.5 10*3/uL (ref 0.1–1.0)
RBC: 4.72 MIL/uL (ref 4.22–5.81)
RDW: 14.3 % (ref 11.5–15.5)
WBC: 5.8 10*3/uL (ref 4.0–10.5)

## 2011-10-29 LAB — BASIC METABOLIC PANEL
BUN: 9 mg/dL (ref 6–23)
CO2: 30 mEq/L (ref 19–32)
Chloride: 99 mEq/L (ref 96–112)
Creat: 0.75 mg/dL (ref 0.50–1.35)

## 2011-11-02 ENCOUNTER — Other Ambulatory Visit (INDEPENDENT_AMBULATORY_CARE_PROVIDER_SITE_OTHER): Payer: Self-pay | Admitting: General Surgery

## 2011-11-02 ENCOUNTER — Telehealth (INDEPENDENT_AMBULATORY_CARE_PROVIDER_SITE_OTHER): Payer: Self-pay | Admitting: General Surgery

## 2011-11-02 DIAGNOSIS — R634 Abnormal weight loss: Secondary | ICD-10-CM

## 2011-11-02 NOTE — Telephone Encounter (Signed)
Pt is post op appe.  He is still in pain, but improving slowly.  He can only sleep in his recliner; wife states he cannot lay down flat or on his side.  He biggest concern is that although he is eating well, he is losing weight.  He has gone from 180 lbs down to 163 lbs.  Postop appt is in 7 days.  She wants to know why he is losing weight while eating well.

## 2011-11-02 NOTE — Telephone Encounter (Signed)
Pt's labs were slightly abnormal, but not alarming.  Dr. Donell Beers ordered CT scan to r/o abdominal abscess.  Pt's wife is aware.

## 2011-11-04 ENCOUNTER — Other Ambulatory Visit (INDEPENDENT_AMBULATORY_CARE_PROVIDER_SITE_OTHER): Payer: Self-pay | Admitting: General Surgery

## 2011-11-04 DIAGNOSIS — R634 Abnormal weight loss: Secondary | ICD-10-CM

## 2011-11-05 ENCOUNTER — Inpatient Hospital Stay: Admission: RE | Admit: 2011-11-05 | Payer: Medicare Other | Source: Ambulatory Visit

## 2011-11-05 ENCOUNTER — Ambulatory Visit
Admission: RE | Admit: 2011-11-05 | Discharge: 2011-11-05 | Disposition: A | Discharge status: Home / Self Care | Payer: Medicare Other | Source: Ambulatory Visit | Attending: General Surgery | Admitting: General Surgery

## 2011-11-05 DIAGNOSIS — R634 Abnormal weight loss: Secondary | ICD-10-CM

## 2011-11-05 DIAGNOSIS — R112 Nausea with vomiting, unspecified: Secondary | ICD-10-CM | POA: Diagnosis not present

## 2011-11-05 DIAGNOSIS — R109 Unspecified abdominal pain: Secondary | ICD-10-CM | POA: Diagnosis not present

## 2011-11-05 MED ORDER — IOHEXOL 300 MG/ML  SOLN
100.0000 mL | Freq: Once | INTRAMUSCULAR | Status: AC | PRN
  Administered 2011-11-05: 100 mL via INTRAVENOUS

## 2011-11-09 ENCOUNTER — Encounter (INDEPENDENT_AMBULATORY_CARE_PROVIDER_SITE_OTHER): Payer: Self-pay | Admitting: General Surgery

## 2011-11-09 ENCOUNTER — Ambulatory Visit (INDEPENDENT_AMBULATORY_CARE_PROVIDER_SITE_OTHER): Payer: Medicare Other | Admitting: General Surgery

## 2011-11-09 VITALS — BP 140/86 | HR 74 | Temp 98.0°F | Resp 18 | Ht 72.0 in | Wt 165.4 lb

## 2011-11-09 DIAGNOSIS — R109 Unspecified abdominal pain: Secondary | ICD-10-CM | POA: Diagnosis not present

## 2011-11-09 DIAGNOSIS — K352 Acute appendicitis with generalized peritonitis, without abscess: Secondary | ICD-10-CM

## 2011-11-09 DIAGNOSIS — K3532 Acute appendicitis with perforation and localized peritonitis, without abscess: Secondary | ICD-10-CM | POA: Insufficient documentation

## 2011-11-09 DIAGNOSIS — I10 Essential (primary) hypertension: Secondary | ICD-10-CM | POA: Diagnosis not present

## 2011-11-09 MED ORDER — HYDROCODONE-ACETAMINOPHEN 5-325 MG PO TABS: 1.0000 | ORAL_TABLET | ORAL | Status: AC | PRN

## 2011-11-09 NOTE — Progress Notes (Signed)
HISTORY: Patient is a 78 year old male who is status post laparoscopic interval appendectomy.  He came in with a ruptured appendix with phlegmon. He improved enough with antibiotics and a drain to go home. He continued to have left-sided abdominal pain. He underwent laparoscopic interval appendectomy and was found to have a appendix in pieces that still had some purulent drainage around it. The small bowel was adherent to the appendix. There was no sign of recurrent left inguinal hernia on his laparoscopy. Postoperatively, he had atrial fibrillation. He was recommended to have a heart monitor at home. He has not done that yet but did just get this in the mail. He denies fevers and chills. He has continued to lose weight.    EXAM: General:  Alert and oriented.  Walking a little stooped over Incision:  Well healed.  Continues to have point tenderness at and above the left pubic ramus.  No recurrent left inguinal hernia.     PATHOLOGY: Appendix, Other than Incidental - APPENDIX WITH ACUTE AND CHRONIC INFLAMMATION, FOREIGN BODY GIANT CELL REACTION AND FEATURES OF TRANSMURAL DEFECT. - THERE IS NO EVIDENCE OF MALIGNANCY.   ASSESSMENT AND PLAN:   Ruptured appendicitis S/p lap interval appendectomy.  Continues to have left pubic pain of unclear etiology.    Will refill pain medication.    There was no evidence of a left hernia on laparoscopy.  I will discuss with Dr. Laverle Patter further workup.       Maudry Diego, MD Surgical Oncology, General & Endocrine Surgery [facility]  Georgann Housekeeper, MD, MD Georgann Housekeeper, MD

## 2011-11-09 NOTE — Assessment & Plan Note (Signed)
S/p lap interval appendectomy.  Continues to have left pubic pain of unclear etiology.    Will refill pain medication.    There was no evidence of a left hernia on laparoscopy.  I will discuss with Dr. Laverle Patter further workup.

## 2011-11-09 NOTE — Patient Instructions (Signed)
I will discuss with Dr. Laverle Patter what direction to go in to try to figure out the pain.

## 2011-11-16 ENCOUNTER — Encounter (INDEPENDENT_AMBULATORY_CARE_PROVIDER_SITE_OTHER): Payer: Medicare Other | Admitting: General Surgery

## 2011-11-30 DIAGNOSIS — M216X9 Other acquired deformities of unspecified foot: Secondary | ICD-10-CM | POA: Diagnosis not present

## 2011-11-30 DIAGNOSIS — G5730 Lesion of lateral popliteal nerve, unspecified lower limb: Secondary | ICD-10-CM | POA: Diagnosis not present

## 2011-12-07 DIAGNOSIS — R109 Unspecified abdominal pain: Secondary | ICD-10-CM | POA: Diagnosis not present

## 2011-12-07 DIAGNOSIS — I10 Essential (primary) hypertension: Secondary | ICD-10-CM | POA: Diagnosis not present

## 2011-12-10 DIAGNOSIS — M216X9 Other acquired deformities of unspecified foot: Secondary | ICD-10-CM | POA: Diagnosis not present

## 2011-12-10 DIAGNOSIS — IMO0001 Reserved for inherently not codable concepts without codable children: Secondary | ICD-10-CM | POA: Diagnosis not present

## 2011-12-10 DIAGNOSIS — R269 Unspecified abnormalities of gait and mobility: Secondary | ICD-10-CM | POA: Diagnosis not present

## 2011-12-10 DIAGNOSIS — M6281 Muscle weakness (generalized): Secondary | ICD-10-CM | POA: Diagnosis not present

## 2011-12-14 ENCOUNTER — Emergency Department (HOSPITAL_COMMUNITY): Payer: Medicare Other

## 2011-12-14 ENCOUNTER — Encounter (HOSPITAL_COMMUNITY): Payer: Self-pay | Admitting: *Deleted

## 2011-12-14 ENCOUNTER — Emergency Department (HOSPITAL_COMMUNITY)
Admission: EM | Admit: 2011-12-14 | Discharge: 2011-12-14 | Disposition: A | Discharge status: Home / Self Care | Payer: Medicare Other | Attending: Emergency Medicine | Admitting: Emergency Medicine

## 2011-12-14 DIAGNOSIS — R10819 Abdominal tenderness, unspecified site: Secondary | ICD-10-CM | POA: Diagnosis not present

## 2011-12-14 DIAGNOSIS — I10 Essential (primary) hypertension: Secondary | ICD-10-CM | POA: Diagnosis not present

## 2011-12-14 DIAGNOSIS — K5900 Constipation, unspecified: Secondary | ICD-10-CM | POA: Diagnosis not present

## 2011-12-14 DIAGNOSIS — F411 Generalized anxiety disorder: Secondary | ICD-10-CM | POA: Diagnosis not present

## 2011-12-14 DIAGNOSIS — F29 Unspecified psychosis not due to a substance or known physiological condition: Secondary | ICD-10-CM | POA: Diagnosis not present

## 2011-12-14 DIAGNOSIS — R109 Unspecified abdominal pain: Secondary | ICD-10-CM | POA: Diagnosis not present

## 2011-12-14 DIAGNOSIS — E785 Hyperlipidemia, unspecified: Secondary | ICD-10-CM | POA: Insufficient documentation

## 2011-12-14 LAB — CBC
Platelets: 264 10*3/uL (ref 150–400)
RBC: 4.52 MIL/uL (ref 4.22–5.81)
RDW: 13.6 % (ref 11.5–15.5)
WBC: 6.5 10*3/uL (ref 4.0–10.5)

## 2011-12-14 LAB — DIFFERENTIAL
Basophils Absolute: 0.1 10*3/uL (ref 0.0–0.1)
Eosinophils Absolute: 0.2 10*3/uL (ref 0.0–0.7)
Lymphocytes Relative: 25 % (ref 12–46)
Lymphs Abs: 1.6 10*3/uL (ref 0.7–4.0)
Neutrophils Relative %: 63 % (ref 43–77)

## 2011-12-14 LAB — URINALYSIS, ROUTINE W REFLEX MICROSCOPIC
Bilirubin Urine: NEGATIVE
Hgb urine dipstick: NEGATIVE
Ketones, ur: NEGATIVE mg/dL
Nitrite: NEGATIVE
Specific Gravity, Urine: 1.01 (ref 1.005–1.030)
pH: 7 (ref 5.0–8.0)

## 2011-12-14 LAB — COMPREHENSIVE METABOLIC PANEL
ALT: 25 U/L (ref 0–53)
AST: 21 U/L (ref 0–37)
Alkaline Phosphatase: 83 U/L (ref 39–117)
CO2: 30 mEq/L (ref 19–32)
Calcium: 9.2 mg/dL (ref 8.4–10.5)
GFR calc Af Amer: >90 mL/min (ref >90)
Glucose, Bld: 100 mg/dL — ABNORMAL HIGH (ref 70–99)
Potassium: 4.5 mEq/L (ref 3.5–5.1)
Sodium: 135 mEq/L (ref 135–145)
Total Protein: 7.1 g/dL (ref 6.0–8.3)

## 2011-12-14 LAB — OCCULT BLOOD, POC DEVICE: Fecal Occult Bld: NEGATIVE

## 2011-12-14 NOTE — ED Notes (Addendum)
Pt reports 3 months ago pt had ruptured appendix. 2 week later surgeon went back in to get out debris and fragments. Soreness has been continued since 3 months. Pain has come and gone, increased in the last week. Pt called dr Marilynn Latino office and they told pt to come into ED to get assessed for a bowel obstruction. Pt reports pain 5/10 "throbbing on both sides of abdomen", pain varies from 5-10/10  abdomen tender. Pain did ease up after bowel movement this am. Denies diarrhea.

## 2011-12-14 NOTE — Discharge Instructions (Signed)
Use MiraLAX twice a day for 1 week then once a day for 3 weeks; to help the constipation.  Stop taking the hydrocodone. Use Benadryl at bedtime to help you sleep. See your Dr. for a checkup in 3 days.   Abdominal Pain Abdominal pain can be caused by many things. Your caregiver decides the seriousness of your pain by an examination and possibly blood tests and X-rays. Many cases can be observed and treated at home. Most abdominal pain is not caused by a disease and will probably improve without treatment. However, in many cases, more time must pass before a clear cause of the pain can be found. Before that point, it may not be known if you need more testing, or if hospitalization or surgery is needed. HOME CARE INSTRUCTIONS   Do not take laxatives unless directed by your caregiver.   Take pain medicine only as directed by your caregiver.   Only take over-the-counter or prescription medicines for pain, discomfort, or fever as directed by your caregiver.   Try a clear liquid diet (broth, tea, or water) for as long as directed by your caregiver. Slowly move to a bland diet as tolerated.  SEEK IMMEDIATE MEDICAL CARE IF:   The pain does not go away.   You have a fever.   You keep throwing up (vomiting).   The pain is felt only in portions of the abdomen. Pain in the right side could possibly be appendicitis. In an adult, pain in the left lower portion of the abdomen could be colitis or diverticulitis.   You pass bloody or black tarry stools.  MAKE SURE YOU:   Understand these instructions.   Will watch your condition.   Will get help right away if you are not doing well or get worse.  Document Released: 04/15/2005 Document Revised: 06/25/2011 Document Reviewed: 02/22/2008 [facility] Patient Information 2012 [place], [place].Constipation in Adults Constipation is having fewer than 2 bowel movements per week. Usually, the stools are hard. As we grow older, constipation is more common. If  you try to fix constipation with laxatives, the problem may get worse. This is because laxatives taken over a long period of time make the colon muscles weaker. A low-fiber diet, not taking in enough fluids, and taking some medicines may make these problems worse. MEDICATIONS THAT MAY CAUSE CONSTIPATION  Water pills (diuretics).   Calcium channel blockers (used to control blood pressure and for the heart).   Certain pain medicines (narcotics).   Anticholinergics.   Anti-inflammatory agents.   Antacids that contain aluminum.  DISEASES THAT CONTRIBUTE TO CONSTIPATION  Diabetes.   Parkinson's disease.   Dementia.   Stroke.   Depression.   Illnesses that cause problems with salt and water metabolism.  HOME CARE INSTRUCTIONS   Constipation is usually best cared for without medicines. Increasing dietary fiber and eating more fruits and vegetables is the best way to manage constipation.   Slowly increase fiber intake to 25 to 38 grams per day. Whole grains, fruits, vegetables, and legumes are good sources of fiber. A dietitian can further help you incorporate high-fiber foods into your diet.   Drink enough water and fluids to keep your urine clear or pale yellow.   A fiber supplement may be added to your diet if you cannot get enough fiber from foods.   Increasing your activities also helps improve regularity.   Suppositories, as suggested by your caregiver, will also help. If you are using antacids, such as aluminum or calcium containing  products, it will be helpful to switch to products containing magnesium if your caregiver says it is okay.   If you have been given a liquid injection (enema) today, this is only a temporary measure. It should not be relied on for treatment of longstanding (chronic) constipation.   Stronger measures, such as magnesium sulfate, should be avoided if possible. This may cause uncontrollable diarrhea. Using magnesium sulfate may not allow you time to  make it to the bathroom.  SEEK IMMEDIATE MEDICAL CARE IF:   There is bright red blood in the stool.   The constipation stays for more than 4 days.   There is belly (abdominal) or rectal pain.   You do not seem to be getting better.   You have any questions or concerns.  MAKE SURE YOU:   Understand these instructions.   Will watch your condition.   Will get help right away if you are not doing well or get worse.  Document Released: 04/03/2004 Document Revised: 06/25/2011 Document Reviewed: 06/09/2011 [facility] Patient Information 2012 [place], [place].

## 2011-12-14 NOTE — ED Notes (Signed)
Patient was given discharge instructions and discussed follow-up visit information, too. No further questions or concerns.

## 2011-12-14 NOTE — ED Provider Notes (Signed)
History     CSN: [id number]  Arrival date & time 12/14/11  1008   First MD Initiated Contact with Patient 12/14/11 1024      Chief Complaint  Patient presents with  . Abdominal Pain    (Consider location/radiation/quality/duration/timing/severity/associated sxs/prior treatment) HPI Comments: DAVIDMICHAEL ZARAZUA is a 78 y.o. Male who has had persistent and worsening abdominal pain for 2 months. This follows an appendectomy after spontaneous rupture, a drain in place for 2 weeks, and a second look exploratory laparotomy. Since the second procedure he has been managed by his PCP. with hydrocodone without improvement. He has not had any fever, chills, nausea, vomiting, dysuria, or change in bowel habits. He denies constipation. He has ongoing, long-term urinary frequency. He status post prostatectomy for cancer. He denies anorexia.  Patient is a 78 y.o. male presenting with abdominal pain. The history is provided by the patient.  Abdominal Pain The primary symptoms of the illness include abdominal pain.    Past Medical History  Diagnosis Date  . Arthritis   . Hyperlipidemia   . Hypertension   . Hearing loss   . Abdominal pain   . Leg swelling   . Cancer     lung - had 1/3 of left lung removed, did not require additional treatment  . Radiation 18 yrs ago    for increased psa  . LLQ abdominal pain     Past Surgical History  Procedure Date  . Elbow surgery     left  surgery for fx 3 yrs ago, right done 40 yrs ago  . Lobectomy 2005  . Prostate surgery 1995  . Lung biopsy 2005    resulted in 1/3 of left lung being removed  . Back surgery 4-5 yrs ago    lower back  . Left knee replacement feb 2012  . Hernia repair 2003  . Laparoscopic appendectomy 10/15/2011    Procedure: APPENDECTOMY LAPAROSCOPIC;  Surgeon: Almond Lint, MD;  Location: [facility];  Service: General;  Laterality: N/A;  . Appendectomy 2013    History reviewed. No pertinent family history.  History  Substance  Use Topics  . Smoking status: Never Smoker   . Smokeless tobacco: Never Used  . Alcohol Use: No      Review of Systems  Gastrointestinal: Positive for abdominal pain.  All other systems reviewed and are negative.    Allergies  Codeine and Shrimp  Home Medications   Current Outpatient Rx  Name Route Sig Dispense Refill  . FUROSEMIDE 20 MG PO TABS Oral Take 20 mg by mouth 2 (two) times daily.    . ADULT MULTIVITAMIN W/MINERALS CH Oral Take 1 tablet by mouth every morning.    Marland Kitchen FISH OIL 1200 MG PO CAPS Oral Take 1 capsule by mouth daily.     Marland Kitchen POTASSIUM 99 MG PO TABS Oral Take 1 tablet by mouth every morning.    Marland Kitchen SIMVASTATIN 20 MG PO TABS Oral Take 20 mg by mouth every evening.       BP 164/95  Pulse 74  Temp(Src) 97.7 F (36.5 C) (Oral)  Resp 18  SpO2 97%  Physical Exam  Nursing note and vitals reviewed. Constitutional: He is oriented to person, place, and time. He appears well-developed and well-nourished. No distress.  HENT:  Head: Normocephalic and atraumatic.  Right Ear: External ear normal.  Left Ear: External ear normal.  Eyes: Conjunctivae and EOM are normal. Pupils are equal, round, and reactive to light.  Neck: Normal range of  motion and phonation normal. Neck supple.  Cardiovascular: Normal rate, regular rhythm, normal heart sounds and intact distal pulses.   Pulmonary/Chest: Effort normal and breath sounds normal. He exhibits no bony tenderness.  Abdominal: Soft. Normal appearance and bowel sounds are normal. There is tenderness (Bilateral lower quadrants, and suprapubic region, no palpable mass). There is no rebound.  Genitourinary:       Rectal exam with small amount of brown stool in the vault. No hemorrhoids. No anterior rectal mass.  Musculoskeletal: Normal range of motion.  Neurological: He is alert and oriented to person, place, and time. He has normal strength. No cranial nerve deficit or sensory deficit. He exhibits normal muscle tone. Coordination  normal.       Mild confusion  Skin: Skin is warm, dry and intact.  Psychiatric: His behavior is normal. Judgment and thought content normal.       Anxious    ED Course  Procedures (including critical care time)  Labs Reviewed  COMPREHENSIVE METABOLIC PANEL - Abnormal; Notable for the following:    Glucose, Bld 100 (*)    GFR calc non Af Amer 82 (*)    All other components within normal limits  CBC  DIFFERENTIAL  URINALYSIS, ROUTINE W REFLEX MICROSCOPIC  OCCULT BLOOD, POC DEVICE  URINE CULTURE   Dg Abd Acute W/chest  12/14/2011  *RADIOLOGY REPORT*  Clinical Data: Left-sided pain.  ACUTE ABDOMEN SERIES (ABDOMEN 2 VIEW & CHEST 1 VIEW)  Comparison: Abdominal CT 11/05/2011 and chest radiograph 10/15/2011  Findings: Chest radiograph demonstrates an old left sixth rib fracture.  There is a chronic tenting of the left hemidiaphragm. Stable scarring in the right lung apex.  Stable appearance of the heart and mediastinum.  No acute chest disease.  No evidence for a pneumothorax.  Mild scoliosis in the thoracolumbar spine.  Pedicle screw fixation at L3-L4.  Air-fluid level in the stomach.  There is a gas and stool in the colon.  No evidence for small bowel dilatation.  IMPRESSION: No acute chest findings.  Nonobstructive bowel gas pattern.  Moderate amount of stool in the colon.  Original Report Authenticated By: Richarda Overlie, M.D.     1. Abdominal pain   2. Constipated       MDM  Ongoing abdominal pain, with signs, and symptoms of constipation. No other acute intra-abdominal process. Doubt metabolic instability, serious bacterial infection or impending vascular collapse; the patient is stable for discharge.  Plan: Home Medications- stop Norco, add Miralax; Home Treatments- rest; Recommended follow up- PCP in 3 days       Flint Melter, MD 12/14/11 1209

## 2011-12-14 NOTE — ED Notes (Signed)
md at bedside  Pt alert and oriented x4. Respirations even and unlabored, bilateral symmetrical rise and fall of chest. Skin warm and dry. In no acute distress. Denies needs.   

## 2011-12-15 LAB — URINE CULTURE
Colony Count: NO GROWTH
Culture  Setup Time: 201305271723
Culture: NO GROWTH

## 2011-12-16 ENCOUNTER — Telehealth (INDEPENDENT_AMBULATORY_CARE_PROVIDER_SITE_OTHER): Payer: Self-pay

## 2011-12-16 DIAGNOSIS — K469 Unspecified abdominal hernia without obstruction or gangrene: Secondary | ICD-10-CM | POA: Diagnosis not present

## 2011-12-16 DIAGNOSIS — R109 Unspecified abdominal pain: Secondary | ICD-10-CM | POA: Diagnosis not present

## 2011-12-16 NOTE — Telephone Encounter (Signed)
Katie w/Dr Zipporah Plants calling to get pt's appt on 6/27 moved up to an earlier appt with Dr Derrell Lolling. The pt is having a lot of discomfort with the hernia and Dr Zipporah Plants wanted his nurse to call to have the appt moved up. Dr Zipporah Plants said if he needs to he will Dr Derrell Lolling to move the appt up earlier. I just advised Florentina Addison that Arline Asp is not in the office today that she will have to talk to Dr Derrell Lolling and call Katie back. Katie said to have her overhead paged for the call.

## 2011-12-17 ENCOUNTER — Telehealth (INDEPENDENT_AMBULATORY_CARE_PROVIDER_SITE_OTHER): Payer: Self-pay

## 2011-12-17 ENCOUNTER — Telehealth (INDEPENDENT_AMBULATORY_CARE_PROVIDER_SITE_OTHER): Payer: Self-pay | Admitting: General Surgery

## 2011-12-17 NOTE — Telephone Encounter (Signed)
Both requests have been given to Dr Derrell Lolling to review. He will have to open schedule. He is out of office 2 weeks coming up and I cannot extend him.

## 2011-12-17 NOTE — Telephone Encounter (Signed)
Katie with Dr Eula Listen called again. They are requesting patient's appt be moved up from 01/14/12. Made her aware that Alisha just sent a message yesterday at 4:15 pm. She stated Dr Eula Listen was pushing her to get this moved up. I let her know I would send another message. They want patient to see Dr Derrell Lolling. She states patient is not incarcerated to her knowledge. I informed them on the signs of incarceration and advised if patient in severe pain he needs to go to the ER. Advised I would pass this message along about moving appt sooner if possible.

## 2011-12-18 DIAGNOSIS — IMO0001 Reserved for inherently not codable concepts without codable children: Secondary | ICD-10-CM | POA: Diagnosis not present

## 2011-12-18 DIAGNOSIS — R269 Unspecified abnormalities of gait and mobility: Secondary | ICD-10-CM | POA: Diagnosis not present

## 2011-12-18 DIAGNOSIS — M6281 Muscle weakness (generalized): Secondary | ICD-10-CM | POA: Diagnosis not present

## 2011-12-18 DIAGNOSIS — M216X9 Other acquired deformities of unspecified foot: Secondary | ICD-10-CM | POA: Diagnosis not present

## 2011-12-21 DIAGNOSIS — M216X9 Other acquired deformities of unspecified foot: Secondary | ICD-10-CM | POA: Diagnosis not present

## 2011-12-21 DIAGNOSIS — IMO0001 Reserved for inherently not codable concepts without codable children: Secondary | ICD-10-CM | POA: Diagnosis not present

## 2011-12-21 DIAGNOSIS — M6281 Muscle weakness (generalized): Secondary | ICD-10-CM | POA: Diagnosis not present

## 2011-12-21 DIAGNOSIS — R269 Unspecified abnormalities of gait and mobility: Secondary | ICD-10-CM | POA: Diagnosis not present

## 2011-12-25 DIAGNOSIS — C61 Malignant neoplasm of prostate: Secondary | ICD-10-CM | POA: Diagnosis not present

## 2011-12-28 DIAGNOSIS — M216X9 Other acquired deformities of unspecified foot: Secondary | ICD-10-CM | POA: Diagnosis not present

## 2011-12-30 ENCOUNTER — Other Ambulatory Visit (HOSPITAL_COMMUNITY): Payer: Self-pay | Admitting: Urology

## 2011-12-30 DIAGNOSIS — C61 Malignant neoplasm of prostate: Secondary | ICD-10-CM

## 2012-01-01 ENCOUNTER — Ambulatory Visit (INDEPENDENT_AMBULATORY_CARE_PROVIDER_SITE_OTHER): Payer: Medicare Other | Admitting: General Surgery

## 2012-01-01 ENCOUNTER — Encounter (INDEPENDENT_AMBULATORY_CARE_PROVIDER_SITE_OTHER): Payer: Self-pay | Admitting: General Surgery

## 2012-01-01 VITALS — BP 100/64 | HR 77 | Temp 98.0°F | Resp 16 | Ht 72.0 in | Wt 168.0 lb

## 2012-01-01 DIAGNOSIS — K4090 Unilateral inguinal hernia, without obstruction or gangrene, not specified as recurrent: Secondary | ICD-10-CM

## 2012-01-01 NOTE — Patient Instructions (Signed)
You will be scheduled for surgery to repair the right inguinal hernia with mesh in the near future.  Continue your evaluation with Dr. Heloise Purpura, and continue your evaluation of your knee pain with Dr. Earlie Counts.  Continue  to take MiraLax daily to avoid constipation.    Inguinal Hernia, Adult Muscles help keep everything in the body in its proper place. But if a weak spot in the muscles develops, something can poke through. That is called a hernia. When this happens in the lower part of the belly (abdomen), it is called an inguinal hernia. (It takes its name from a part of the body in this region called the inguinal canal.) A weak spot in the wall of muscles lets some fat or part of the small intestine bulge through. An inguinal hernia can develop at any age. Men get them more often than women. CAUSES  In adults, an inguinal hernia develops over time.  It can be triggered by:   Suddenly straining the muscles of the lower abdomen.   Lifting heavy objects.   Straining to have a bowel movement. Difficult bowel movements (constipation) can lead to this.   Constant coughing. This may be caused by smoking or lung disease.   Being overweight.   Being pregnant.   Working at a job that requires long periods of standing or heavy lifting.   Having had an inguinal hernia before.  One type can be an emergency situation. It is called a strangulated inguinal hernia. It develops if part of the small intestine slips through the weak spot and cannot get back into the abdomen. The blood supply can be cut off. If that happens, part of the intestine may die. This situation requires emergency surgery. SYMPTOMS  Often, a small inguinal hernia has no symptoms. It is found when a healthcare provider does a physical exam. Larger hernias usually have symptoms.   In adults, symptoms may include:   A lump in the groin. This is easier to see when the person is standing. It might disappear when lying down.     In men, a lump in the scrotum.   Pain or burning in the groin. This occurs especially when lifting, straining or coughing.   A dull ache or feeling of pressure in the groin.   Signs of a strangulated hernia can include:   A bulge in the groin that becomes very painful and tender to the touch.   A bulge that turns red or purple.   Fever, nausea and vomiting.   Inability to have a bowel movement or to pass gas.  DIAGNOSIS  To decide if you have an inguinal hernia, a healthcare provider will probably do a physical examination.  This will include asking questions about any symptoms you have noticed.   The healthcare provider might feel the groin area and ask you to cough. If an inguinal hernia is felt, the healthcare provider may try to slide it back into the abdomen.   Usually no other tests are needed.  TREATMENT  Treatments can vary. The size of the hernia makes a difference. Options include:  Watchful waiting. This is often suggested if the hernia is small and you have had no symptoms.   No medical procedure will be done unless symptoms develop.   You will need to watch closely for symptoms. If any occur, contact your healthcare provider right away.   Surgery. This is used if the hernia is larger or you have symptoms.   Open surgery.  This is usually an outpatient procedure (you will not stay overnight in a hospital). An cut (incision) is made through the skin in the groin. The hernia is put back inside the abdomen. The weak area in the muscles is then repaired by herniorrhaphy or hernioplasty. Herniorrhaphy: in this type of surgery, the weak muscles are sewn back together. Hernioplasty: a patch or mesh is used to close the weak area in the abdominal wall.   Laparoscopy. In this procedure, a surgeon makes small incisions. A thin tube with a tiny video camera (called a laparoscope) is put into the abdomen. The surgeon repairs the hernia with mesh by looking with the video camera  and using two long instruments.  HOME CARE INSTRUCTIONS   After surgery to repair an inguinal hernia:   You will need to take pain medicine prescribed by your healthcare provider. Follow all directions carefully.   You will need to take care of the wound from the incision.   Your activity will be restricted for awhile. This will probably include no heavy lifting for several weeks. You also should not do anything too active for a few weeks. When you can return to work will depend on the type of job that you have.   During "watchful waiting" periods, you should:   Maintain a healthy weight.   Eat a diet high in fiber (fruits, vegetables and whole grains).   Drink plenty of fluids to avoid constipation. This means drinking enough water and other liquids to keep your urine clear or pale yellow.   Do not lift heavy objects.   Do not stand for long periods of time.   Quit smoking. This should keep you from developing a frequent cough.  SEEK MEDICAL CARE IF:   A bulge develops in your groin area.   You feel pain, a burning sensation or pressure in the groin. This might be worse if you are lifting or straining.   You develop a fever of more than 100.5 F (38.1 C).  SEEK IMMEDIATE MEDICAL CARE IF:   Pain in the groin increases suddenly.   A bulge in the groin gets bigger suddenly and does not go down.   For men, there is sudden pain in the scrotum. Or, the size of the scrotum increases.   A bulge in the groin area becomes red or purple and is painful to touch.   You have nausea or vomiting that does not go away.   You feel your heart beating much faster than normal.   You cannot have a bowel movement or pass gas.   You develop a fever of more than 102.0 F (38.9 C).  Document Released: 11/22/2008 Document Revised: 06/25/2011 Document Reviewed: 11/22/2008 [facility] Patient Information 2012 [place], [place].

## 2012-01-01 NOTE — Progress Notes (Signed)
Patient ID: Justin Porter, male   DOB: 02-11-1929, 78 y.o.   MRN: [id number]  Chief Complaint  Patient presents with  . New Evaluation    Est Pt. New Prob Eval RIH    HPI Justin Porter is a 78 y.o. male.  He is an old patient of mine, having undergone a left inguinal hernia repair by me in 2008. He was sent to me at this time by Dr. Tyson Dense for management of a painful right inguinal hernia.  Significant history is the patient underwent open prostatectomy in the past, as well as lung resection for metastatic prostate cancer by Dr. Edwyna Shell.    His PSA has been rising and he has seen Dr. Laverle Patter and he has bone scan and CT scans scheduled for next week. I discussed this with Dr. Laverle Patter and he is going to continue the workup but says that we should proceed with inguinal hernia surgery at any time because of his pain.  The patient also is also being evaluated by Dr. 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  Significant recent history is that he had ruptured appendicitis in February and underwent percutaneous drainage. Dr. Donell Beers performed interval laparoscopic appendectomy in March and he has slowly recovered from that surgery. He did show up in the emergency department on May 27 for  abdominal pain and was found to simply be constipated. That has improved with daily MiraLax.  In terms of his right inguinal hernia it has been causing pain since February of this year. He notices a bulge. He has not had any nausea or vomiting associated with pain in his right groin. HPI  Past Medical History  Diagnosis Date  . Arthritis   . Hyperlipidemia   . Hypertension   . Hearing loss   . Abdominal pain   . Leg swelling   . Cancer     lung - had 1/3 of left lung removed, did not require additional treatment  . Radiation 18 yrs ago    for increased psa  . LLQ abdominal pain     Past Surgical History  Procedure Date  . Elbow surgery     left   surgery for fx 3 yrs ago, right done 40 yrs ago  . Lobectomy 2005  . Prostate surgery 1995  . Lung biopsy 2005    resulted in 1/3 of left lung being removed  . Back surgery 4-5 yrs ago    lower back  . Left knee replacement feb 2012  . Hernia repair 2003  . Laparoscopic appendectomy 10/15/2011    Procedure: APPENDECTOMY LAPAROSCOPIC;  Surgeon: Almond Lint, MD;  Location: [facility];  Service: General;  Laterality: N/A;  . Appendectomy 2013    History reviewed. No pertinent family history.  Social History History  Substance Use Topics  . Smoking status: Never Smoker   . Smokeless tobacco: Never Used  . Alcohol Use: No    Allergies  Allergen Reactions  . Codeine Nausea And Vomiting  . Shrimp (Shellfish Allergy) Nausea And Vomiting    Current Outpatient Prescriptions  Medication Sig Dispense Refill  . furosemide (LASIX) 20 MG tablet Take 20 mg by mouth 2 (two) times daily.      . Multiple Vitamin (MULITIVITAMIN WITH MINERALS) TABS Take 1 tablet by mouth every morning.      . Omega-3 Fatty Acids (FISH OIL) 1200 MG CAPS Take 1 capsule by mouth daily.       Marland Kitchen  Potassium 99 MG TABS Take 1 tablet by mouth every morning.      . simvastatin (ZOCOR) 20 MG tablet Take 20 mg by mouth every evening.         Review of Systems Review of Systems  Constitutional: Negative for fever, chills and unexpected weight change.  HENT: Positive for neck stiffness. Negative for hearing loss, congestion, sore throat, trouble swallowing and voice change.   Eyes: Negative for visual disturbance.  Respiratory: Negative for cough and wheezing.   Cardiovascular: Negative for chest pain, palpitations and leg swelling.  Gastrointestinal: Positive for constipation. Negative for nausea, vomiting, abdominal pain, diarrhea, blood in stool, abdominal distention, anal bleeding and rectal pain.  Genitourinary: Positive for frequency. Negative for hematuria and difficulty urinating.  Musculoskeletal: Positive for back  pain and arthralgias.  Skin: Negative for rash and wound.  Neurological: Negative for seizures, syncope, weakness and headaches.  Hematological: Negative for adenopathy. Does not bruise/bleed easily.  Psychiatric/Behavioral: Negative for confusion.    Blood pressure 100/64, pulse 77, temperature 98 F (36.7 C), temperature source Temporal, resp. rate 16, height 6' (1.829 m), weight 168 lb (76.204 kg).  Physical Exam Physical Exam  Constitutional: He is oriented to person, place, and time. He appears well-developed and well-nourished. No distress.  HENT:  Head: Normocephalic.  Nose: Nose normal.  Mouth/Throat: No oropharyngeal exudate.  Eyes: Conjunctivae and EOM are normal. Pupils are equal, round, and reactive to light. Right eye exhibits no discharge. Left eye exhibits no discharge. No scleral icterus.  Neck: Normal range of motion. Neck supple. No JVD present. No tracheal deviation present. No thyromegaly present.  Cardiovascular: Normal rate, regular rhythm, normal heart sounds and intact distal pulses.   No murmur heard. Pulmonary/Chest: Effort normal and breath sounds normal. No stridor. No respiratory distress. He has no wheezes. He has no rales. He exhibits no tenderness.  Abdominal: Soft. Bowel sounds are normal. He exhibits no distension and no mass. There is no tenderness. There is no rebound and no guarding.       Lower midline incision from prostatectomy. Left inguinal incision from prior hernia surgery. Laparoscopic scars.  Genitourinary:       He has a right inguinal hernia, the size of a large egg, reducible when supine but but quite tender to touch. Once it was reduced there was no pain. No evidence of hernia on the left. Penis scrotum and testes are normal.  Musculoskeletal: Normal range of motion. He exhibits no edema and no tenderness.  Lymphadenopathy:    He has no cervical adenopathy.  Neurological: He is alert and oriented to person, place, and time. He has normal  reflexes. Coordination normal.  Skin: Skin is warm and dry. No rash noted. He is not diaphoretic. No erythema. No pallor.  Psychiatric: He has a normal mood and affect. His behavior is normal. Judgment and thought content normal.    Data Reviewed I called Dr. Heloise Purpura and  coordinated the patient's care with him. I reviewed the recent CT scan, recent emergent room records, hospitalizations for appendicitis, and my records from 2008..  Assessment    Symptomatic right inguinal hernia. I believe that he would benefit from elective repair with mesh.  History prostatectomy and pulmonary resection for prostate cancer with lung metastasis.  Recent elevation of PSA, currently undergoing staging workup.  History recent ruptured appendicitis requiring interval appendectomy following percutaneous drainage.  Chronic constipation  Hypertension  History lumbar disc disease and spinal discectomy and fusion    Plan  Proceed with scheduling for repair of his right renal hernia with mesh in the near future.  I discussed the indications and details, techniques and numerous risks of this operation with the patient and his wife. They understand these issues well since he has had hernia repair of the opposite side. His questions are answered. He agrees with this plan.       Angelia Mould. Derrell Lolling, M.D., [facility] General and Minimally invasive Surgery Breast and Colorectal Surgery Office:   [phone] Pager:   [phone]  01/01/2012, 2:59 PM

## 2012-01-04 ENCOUNTER — Emergency Department (HOSPITAL_COMMUNITY)
Admission: EM | Admit: 2012-01-04 | Discharge: 2012-01-04 | Disposition: A | Discharge status: Home / Self Care | Payer: Medicare Other | Attending: Emergency Medicine | Admitting: Emergency Medicine

## 2012-01-04 ENCOUNTER — Encounter (HOSPITAL_COMMUNITY): Payer: Self-pay | Admitting: Emergency Medicine

## 2012-01-04 ENCOUNTER — Emergency Department (HOSPITAL_COMMUNITY): Payer: Medicare Other

## 2012-01-04 DIAGNOSIS — S8001XA Contusion of right knee, initial encounter: Secondary | ICD-10-CM

## 2012-01-04 DIAGNOSIS — S0003XA Contusion of scalp, initial encounter: Secondary | ICD-10-CM | POA: Diagnosis not present

## 2012-01-04 DIAGNOSIS — S51019A Laceration without foreign body of unspecified elbow, initial encounter: Secondary | ICD-10-CM

## 2012-01-04 DIAGNOSIS — S0180XA Unspecified open wound of other part of head, initial encounter: Secondary | ICD-10-CM | POA: Diagnosis not present

## 2012-01-04 DIAGNOSIS — S0083XA Contusion of other part of head, initial encounter: Secondary | ICD-10-CM | POA: Diagnosis not present

## 2012-01-04 DIAGNOSIS — M25529 Pain in unspecified elbow: Secondary | ICD-10-CM | POA: Diagnosis not present

## 2012-01-04 DIAGNOSIS — S5000XA Contusion of unspecified elbow, initial encounter: Secondary | ICD-10-CM | POA: Diagnosis not present

## 2012-01-04 DIAGNOSIS — S1093XA Contusion of unspecified part of neck, initial encounter: Secondary | ICD-10-CM | POA: Diagnosis not present

## 2012-01-04 DIAGNOSIS — Z85118 Personal history of other malignant neoplasm of bronchus and lung: Secondary | ICD-10-CM | POA: Insufficient documentation

## 2012-01-04 DIAGNOSIS — M25469 Effusion, unspecified knee: Secondary | ICD-10-CM | POA: Diagnosis not present

## 2012-01-04 DIAGNOSIS — R220 Localized swelling, mass and lump, head: Secondary | ICD-10-CM | POA: Diagnosis not present

## 2012-01-04 DIAGNOSIS — S8000XA Contusion of unspecified knee, initial encounter: Secondary | ICD-10-CM | POA: Insufficient documentation

## 2012-01-04 DIAGNOSIS — T1490XA Injury, unspecified, initial encounter: Secondary | ICD-10-CM | POA: Diagnosis not present

## 2012-01-04 DIAGNOSIS — E785 Hyperlipidemia, unspecified: Secondary | ICD-10-CM | POA: Insufficient documentation

## 2012-01-04 DIAGNOSIS — I10 Essential (primary) hypertension: Secondary | ICD-10-CM | POA: Diagnosis not present

## 2012-01-04 DIAGNOSIS — R221 Localized swelling, mass and lump, neck: Secondary | ICD-10-CM | POA: Diagnosis not present

## 2012-01-04 DIAGNOSIS — S5002XA Contusion of left elbow, initial encounter: Secondary | ICD-10-CM

## 2012-01-04 DIAGNOSIS — G5730 Lesion of lateral popliteal nerve, unspecified lower limb: Secondary | ICD-10-CM | POA: Diagnosis not present

## 2012-01-04 DIAGNOSIS — S51009A Unspecified open wound of unspecified elbow, initial encounter: Secondary | ICD-10-CM | POA: Diagnosis not present

## 2012-01-04 DIAGNOSIS — W010XXA Fall on same level from slipping, tripping and stumbling without subsequent striking against object, initial encounter: Secondary | ICD-10-CM | POA: Insufficient documentation

## 2012-01-04 DIAGNOSIS — M25569 Pain in unspecified knee: Secondary | ICD-10-CM | POA: Diagnosis not present

## 2012-01-04 DIAGNOSIS — M25869 Other specified joint disorders, unspecified knee: Secondary | ICD-10-CM | POA: Diagnosis not present

## 2012-01-04 NOTE — ED Notes (Signed)
Patient transported to CT 

## 2012-01-04 NOTE — ED Notes (Signed)
Per EMS- pt c/o of left sided forehead laceration, right knee pain, and left elbow laceration after fall. Pt states that he was at his orthopedic appt for pinch knee nerve, tripped over a wheelchair that was sitting out in the hall way and fell onto right knee and hit head on tile floor.

## 2012-01-04 NOTE — Discharge Instructions (Signed)
Contusion A contusion is a deep bruise. Contusions are the result of an injury that caused bleeding under the skin. The contusion may turn blue, purple, or yellow. Minor injuries will give you a painless contusion, but more severe contusions may stay painful and swollen for a few weeks.  CAUSES  A contusion is usually caused by a blow, trauma, or direct force to an area of the body. SYMPTOMS   Swelling and redness of the injured area.   Bruising of the injured area.   Tenderness and soreness of the injured area.   Pain.  DIAGNOSIS  The diagnosis can be made by taking a history and physical exam. An X-ray, CT scan, or MRI may be needed to determine if there were any associated injuries, such as fractures. TREATMENT  Specific treatment will depend on what area of the body was injured. In general, the best treatment for a contusion is resting, icing, elevating, and applying cold compresses to the injured area. Over-the-counter medicines may also be recommended for pain control. Ask your caregiver what the best treatment is for your contusion. HOME CARE INSTRUCTIONS   Put ice on the injured area.   Put ice in a plastic bag.   Place a towel between your skin and the bag.   Leave the ice on for 15 to 20 minutes, 3 to 4 times a day.   Only take over-the-counter or prescription medicines for pain, discomfort, or fever as directed by your caregiver. Your caregiver may recommend avoiding anti-inflammatory medicines (aspirin, ibuprofen, and naproxen) for 48 hours because these medicines may increase bruising.   Rest the injured area.   If possible, elevate the injured area to reduce swelling.  SEEK IMMEDIATE MEDICAL CARE IF:   You have increased bruising or swelling.   You have pain that is getting worse.   Your swelling or pain is not relieved with medicines.  MAKE SURE YOU:   Understand these instructions.   Will watch your condition.   Will get help right away if you are not  doing well or get worse.  Document Released: 04/15/2005 Document Revised: 06/25/2011 Document Reviewed: 05/11/2011 [facility] Patient Information 2012 [place], [place].  Head Injury, Adult You have had a head injury that does not appear serious at this time. A concussion is a state of changed mental ability, usually from a blow to the head. You should take clear liquids for the rest of the day and then resume your regular diet. You should not take sedatives or alcoholic beverages for as long as directed by your caregiver after discharge. After injuries such as yours, most problems occur within the first 24 hours. SYMPTOMS These minor symptoms may be experienced after discharge:  Memory difficulties.   Dizziness.   Headaches.   Double vision.   Hearing difficulties.   Depression.   Tiredness.   Weakness.   Difficulty with concentration.  If you experience any of these problems, you should not be alarmed. A concussion requires a few days for recovery. Many patients with head injuries frequently experience such symptoms. Usually, these problems disappear without medical care. If symptoms last for more than one day, notify your caregiver. See your caregiver sooner if symptoms are becoming worse rather than better. HOME CARE INSTRUCTIONS   During the next 24 hours you must stay with someone who can watch you for the warning signs listed below.  Although it is unlikely that serious side effects will occur, you should be aware of signs and symptoms which may  necessitate your return to this location. Side effects may occur up to 7 - 10 days following the injury. It is important for you to carefully monitor your condition and contact your caregiver or seek immediate medical attention if there is a change in your condition. SEEK IMMEDIATE MEDICAL CARE IF:   There is confusion or drowsiness.   You can not awaken the injured person.   There is nausea (feeling sick to your stomach) or continued,  forceful vomiting.   You notice dizziness or unsteadiness which is getting worse, or inability to walk.   You have convulsions or unconsciousness.   You experience severe, persistent headaches not relieved by over-the-counter or prescription medicines for pain. (Do not take aspirin as this impairs clotting abilities). Take other pain medications only as directed.   You can not use arms or legs normally.   There is clear or bloody discharge from the nose or ears.  MAKE SURE YOU:   Understand these instructions.   Will watch your condition.   Will get help right away if you are not doing well or get worse.  Document Released: 07/06/2005 Document Revised: 06/25/2011 Document Reviewed: 05/24/2009 [facility] Patient Information 2012 [place], [place].  Laceration Care, Adult A laceration is a cut or lesion that goes through all layers of the skin and into the tissue just beneath the skin. TREATMENT  Some lacerations may not require closure. Some lacerations may not be able to be closed due to an increased risk of infection. It is important to see your caregiver as soon as possible after an injury to minimize the risk of infection and maximize the opportunity for successful closure. If closure is appropriate, pain medicines may be given, if needed. The wound will be cleaned to help prevent infection. Your caregiver will use stitches (sutures), staples, wound glue (adhesive), or skin adhesive strips to repair the laceration. These tools bring the skin edges together to allow for faster healing and a better cosmetic outcome. However, all wounds will heal with a scar. Once the wound has healed, scarring can be minimized by covering the wound with sunscreen during the day for 1 full year. HOME CARE INSTRUCTIONS  For sutures or staples:  Keep the wound clean and dry.   If you were given a bandage (dressing), you should change it at least once a day. Also, change the dressing if it becomes wet or  dirty, or as directed by your caregiver.   Wash the wound with soap and water 2 times a day. Rinse the wound off with water to remove all soap. Pat the wound dry with a clean towel.   After cleaning, apply a thin layer of the antibiotic ointment as recommended by your caregiver. This will help prevent infection and keep the dressing from sticking.   You may shower as usual after the first 24 hours. Do not soak the wound in water until the sutures are removed.   Only take over-the-counter or prescription medicines for pain, discomfort, or fever as directed by your caregiver.   Get your sutures or staples removed as directed by your caregiver.  For skin adhesive strips:  Keep the wound clean and dry.   Do not get the skin adhesive strips wet. You may bathe carefully, using caution to keep the wound dry.   If the wound gets wet, pat it dry with a clean towel.   Skin adhesive strips will fall off on their own. You may trim the strips as the wound heals.  Do not remove skin adhesive strips that are still stuck to the wound. They will fall off in time.  For wound adhesive:  You may briefly wet your wound in the shower or bath. Do not soak or scrub the wound. Do not swim. Avoid periods of heavy perspiration until the skin adhesive has fallen off on its own. After showering or bathing, gently pat the wound dry with a clean towel.   Do not apply liquid medicine, cream medicine, or ointment medicine to your wound while the skin adhesive is in place. This may loosen the film before your wound is healed.   If a dressing is placed over the wound, be careful not to apply tape directly over the skin adhesive. This may cause the adhesive to be pulled off before the wound is healed.   Avoid prolonged exposure to sunlight or tanning lamps while the skin adhesive is in place. Exposure to ultraviolet light in the first year will darken the scar.   The skin adhesive will usually remain in place for 5 to 10  days, then naturally fall off the skin. Do not pick at the adhesive film.  You may need a tetanus shot if:  You cannot remember when you had your last tetanus shot.   You have never had a tetanus shot.  If you get a tetanus shot, your arm may swell, get red, and feel warm to the touch. This is common and not a problem. If you need a tetanus shot and you choose not to have one, there is a rare chance of getting tetanus. Sickness from tetanus can be serious. SEEK MEDICAL CARE IF:   You have redness, swelling, or increasing pain in the wound.   You see a red line that goes away from the wound.   You have yellowish-white fluid (pus) coming from the wound.   You have a fever.   You notice a bad smell coming from the wound or dressing.   Your wound breaks open before or after sutures have been removed.   You notice something coming out of the wound such as wood or glass.   Your wound is on your hand or foot and you cannot move a finger or toe.  SEEK IMMEDIATE MEDICAL CARE IF:   Your pain is not controlled with prescribed medicine.   You have severe swelling around the wound causing pain and numbness or a change in color in your arm, hand, leg, or foot.   Your wound splits open and starts bleeding.   You have worsening numbness, weakness, or loss of function of any joint around or beyond the wound.   You develop painful lumps near the wound or on the skin anywhere on your body.  MAKE SURE YOU:   Understand these instructions.   Will watch your condition.   Will get help right away if you are not doing well or get worse.  Document Released: 07/06/2005 Document Revised: 06/25/2011 Document Reviewed: 12/30/2010 [facility] Patient Information 2012 [place], [place].

## 2012-01-04 NOTE — ED Provider Notes (Signed)
History     CSN: [id number]  Arrival date & time 01/04/12  1600   First MD Initiated Contact with Patient 01/04/12 1636      Chief Complaint  Patient presents with  . Fall  . Head Laceration    (Consider location/radiation/quality/duration/timing/severity/associated sxs/prior treatment) HPI Pt tripped over wheelchair and fell to floor. Struck L forehead, L elbow, and R knee. No LOC. No neck pain, focal weakness, or sensory loss. Pt last had Td updated several weeks ago.  Past Medical History  Diagnosis Date  . Arthritis   . Hyperlipidemia   . Hypertension   . Hearing loss   . Abdominal pain   . Leg swelling   . Cancer     lung - had 1/3 of left lung removed, did not require additional treatment  . Radiation 18 yrs ago    for increased psa  . LLQ abdominal pain     Past Surgical History  Procedure Date  . Elbow surgery     left  surgery for fx 3 yrs ago, right done 40 yrs ago  . Lobectomy 2005  . Prostate surgery 1995  . Lung biopsy 2005    resulted in 1/3 of left lung being removed  . Back surgery 4-5 yrs ago    lower back  . Left knee replacement feb 2012  . Hernia repair 2003  . Laparoscopic appendectomy 10/15/2011    Procedure: APPENDECTOMY LAPAROSCOPIC;  Surgeon: Almond Lint, MD;  Location: [facility];  Service: General;  Laterality: N/A;  . Appendectomy 2013    No family history on file.  History  Substance Use Topics  . Smoking status: Never Smoker   . Smokeless tobacco: Never Used  . Alcohol Use: No      Review of Systems  Constitutional: Negative for fever, chills and diaphoresis.  Eyes: Negative for visual disturbance.  Respiratory: Negative for cough and shortness of breath.   Cardiovascular: Negative for chest pain and leg swelling.  Gastrointestinal: Negative for nausea, vomiting and abdominal pain.  Musculoskeletal: Negative for back pain.  Skin: Positive for wound. Negative for rash.  Neurological: Positive for headaches. Negative for  dizziness, syncope, weakness, light-headedness and numbness.  Hematological: Does not bruise/bleed easily.    Allergies  Codeine and Shrimp  Home Medications   Current Outpatient Rx  Name Route Sig Dispense Refill  . FUROSEMIDE 20 MG PO TABS Oral Take 20 mg by mouth 2 (two) times daily.    . ADULT MULTIVITAMIN W/MINERALS CH Oral Take 1 tablet by mouth every morning.    Marland Kitchen FISH OIL 1200 MG PO CAPS Oral Take 1 capsule by mouth daily.     Marland Kitchen POTASSIUM 99 MG PO TABS Oral Take 1 tablet by mouth every morning.    Marland Kitchen SIMVASTATIN 20 MG PO TABS Oral Take 20 mg by mouth every evening.       BP 157/77  Pulse 70  Temp 98.3 F (36.8 C) (Oral)  Resp 18  SpO2 100%  Physical Exam  Nursing note and vitals reviewed. Constitutional: He is oriented to person, place, and time. He appears well-developed and well-nourished. No distress.  HENT:  Head: Normocephalic.  Mouth/Throat: Oropharynx is clear and moist.       L forehead hematoma with <1 cm laceration. No active bleeding or deformity  Eyes: EOM are normal. Pupils are equal, round, and reactive to light.  Neck: Normal range of motion. Neck supple.       No posterior cervical midline TTP  Cardiovascular: Normal rate and regular rhythm.   Pulmonary/Chest: Effort normal and breath sounds normal. No respiratory distress. He has no wheezes. He has no rales.  Abdominal: Soft. Bowel sounds are normal. There is no tenderness. There is no rebound and no guarding.  Musculoskeletal: Normal range of motion. He exhibits tenderness (TTP over R patella, contusion noted. L elbow with normal ROM. Skin tear noted. ). He exhibits no edema.  Neurological: He is alert and oriented to person, place, and time.       5/5 motor, sensation intact  Skin: Skin is warm and dry. No rash noted. No erythema.  Psychiatric: He has a normal mood and affect. His behavior is normal.    ED Course  Procedures (including critical care time)  Labs Reviewed - No data to  display Dg Elbow Complete Left  01/04/2012  *RADIOLOGY REPORT*  Clinical Data: Larey Seat.  Left elbow pain.  LEFT ELBOW - COMPLETE 3+ VIEW  Comparison: 01/24/2008.  Findings: No significant degenerative changes are again demonstrated.  There is a healed olecranon fracture with fixating hardware.  No obvious acute fracture.  A laceration is noted in the olecranon region.  IMPRESSION:  1.  Significant degenerative changes and post-traumatic changes. 2.  No definite acute fracture or joint effusion.  Original Report Authenticated By: P. Loralie Champagne, M.D.   Ct Head Wo Contrast  01/04/2012  *RADIOLOGY REPORT*  Clinical Data: Fall, left forehead laceration.  CT HEAD WITHOUT CONTRAST  Technique:  Contiguous axial images were obtained from the base of the skull through the vertex without contrast.  Comparison: 01/24/2008  Findings: Soft tissue swelling over the left forehead with scalp hematoma.  No underlying calvarial abnormality.  Diffuse cerebral atrophy. No acute intracranial abnormality.  Specifically, no hemorrhage, hydrocephalus, mass lesion, acute infarction, or significant intracranial injury.  No acute calvarial abnormality. Visualized paranasal sinuses and mastoids clear.  Orbital soft tissues unremarkable.  IMPRESSION: No acute intracranial abnormality.  Original Report Authenticated By: Cyndie Chime, M.D.   Dg Knee Complete 4 Views Right  01/04/2012  *RADIOLOGY REPORT*  Clinical Data: Larey Seat.  Right knee pain.  RIGHT KNEE - COMPLETE 4+ VIEW  Comparison: None  Findings: There are significant tricompartmental degenerative changes with joint space narrowing and osteophytic spurring.  No definite acute fracture or osteochondral abnormality.  A small to moderate sized joint effusion is noted.  IMPRESSION:  1.  Tricompartmental degenerative changes. 2.  No acute fracture. 3.  Moderate sized joint effusion.  Original Report Authenticated By: P. Loralie Champagne, M.D.     1. Scalp contusion   2. Contusion of  right knee   3. Left elbow contusion   4. Skin tear of elbow without complication       MDM    Pt ambulatory. Wounds cleaned and dressed. Follow up with PMD. Return for concerns      Loren Racer, MD 01/04/12 Ernestina [place]

## 2012-01-08 ENCOUNTER — Encounter (HOSPITAL_COMMUNITY): Payer: Self-pay | Admitting: Pharmacy Technician

## 2012-01-11 ENCOUNTER — Encounter (HOSPITAL_COMMUNITY)
Admission: RE | Admit: 2012-01-11 | Discharge: 2012-01-11 | Disposition: A | Discharge status: Home / Self Care | Payer: Medicare Other | Source: Ambulatory Visit | Attending: Urology | Admitting: Urology

## 2012-01-11 ENCOUNTER — Encounter (HOSPITAL_COMMUNITY): Payer: Self-pay

## 2012-01-11 DIAGNOSIS — C61 Malignant neoplasm of prostate: Secondary | ICD-10-CM | POA: Insufficient documentation

## 2012-01-11 DIAGNOSIS — M4120 Other idiopathic scoliosis, site unspecified: Secondary | ICD-10-CM | POA: Diagnosis not present

## 2012-01-11 DIAGNOSIS — R972 Elevated prostate specific antigen [PSA]: Secondary | ICD-10-CM | POA: Diagnosis not present

## 2012-01-11 DIAGNOSIS — Z8546 Personal history of malignant neoplasm of prostate: Secondary | ICD-10-CM | POA: Diagnosis not present

## 2012-01-11 MED ORDER — TECHNETIUM TC 99M MEDRONATE IV KIT
23.9000 | PACK | Freq: Once | INTRAVENOUS | Status: AC | PRN
  Administered 2012-01-11: 23.9 via INTRAVENOUS

## 2012-01-12 DIAGNOSIS — G5730 Lesion of lateral popliteal nerve, unspecified lower limb: Secondary | ICD-10-CM | POA: Diagnosis not present

## 2012-01-14 ENCOUNTER — Encounter (INDEPENDENT_AMBULATORY_CARE_PROVIDER_SITE_OTHER): Payer: Medicare Other | Admitting: General Surgery

## 2012-01-14 ENCOUNTER — Encounter (HOSPITAL_COMMUNITY): Payer: Self-pay

## 2012-01-14 ENCOUNTER — Encounter (HOSPITAL_COMMUNITY)
Admission: RE | Admit: 2012-01-14 | Discharge: 2012-01-14 | Disposition: A | Discharge status: Home / Self Care | Payer: Medicare Other | Source: Ambulatory Visit | Attending: General Surgery | Admitting: General Surgery

## 2012-01-14 DIAGNOSIS — I10 Essential (primary) hypertension: Secondary | ICD-10-CM | POA: Diagnosis not present

## 2012-01-14 DIAGNOSIS — R972 Elevated prostate specific antigen [PSA]: Secondary | ICD-10-CM | POA: Diagnosis not present

## 2012-01-14 DIAGNOSIS — Z902 Acquired absence of lung [part of]: Secondary | ICD-10-CM | POA: Diagnosis not present

## 2012-01-14 DIAGNOSIS — K4090 Unilateral inguinal hernia, without obstruction or gangrene, not specified as recurrent: Secondary | ICD-10-CM | POA: Diagnosis not present

## 2012-01-14 DIAGNOSIS — Z96659 Presence of unspecified artificial knee joint: Secondary | ICD-10-CM | POA: Diagnosis not present

## 2012-01-14 DIAGNOSIS — Z923 Personal history of irradiation: Secondary | ICD-10-CM | POA: Diagnosis not present

## 2012-01-14 DIAGNOSIS — G5730 Lesion of lateral popliteal nerve, unspecified lower limb: Secondary | ICD-10-CM | POA: Diagnosis not present

## 2012-01-14 DIAGNOSIS — E785 Hyperlipidemia, unspecified: Secondary | ICD-10-CM | POA: Diagnosis not present

## 2012-01-14 DIAGNOSIS — C61 Malignant neoplasm of prostate: Secondary | ICD-10-CM | POA: Diagnosis not present

## 2012-01-14 DIAGNOSIS — Z9089 Acquired absence of other organs: Secondary | ICD-10-CM | POA: Diagnosis not present

## 2012-01-14 DIAGNOSIS — Z01812 Encounter for preprocedural laboratory examination: Secondary | ICD-10-CM | POA: Diagnosis not present

## 2012-01-14 DIAGNOSIS — Z9079 Acquired absence of other genital organ(s): Secondary | ICD-10-CM | POA: Diagnosis not present

## 2012-01-14 HISTORY — PX: HERNIA REPAIR: SHX51

## 2012-01-14 LAB — BASIC METABOLIC PANEL
BUN: 10 mg/dL (ref 6–23)
Calcium: 9.3 mg/dL (ref 8.4–10.5)
Creatinine, Ser: 0.77 mg/dL (ref 0.50–1.35)
GFR calc Af Amer: >90 mL/min (ref >90)
GFR calc non Af Amer: 83 mL/min — ABNORMAL LOW (ref >90)

## 2012-01-14 LAB — SURGICAL PCR SCREEN: MRSA, PCR: NEGATIVE

## 2012-01-14 NOTE — Patient Instructions (Addendum)
01-14-12 EKG(10-14-11)/CXR(10-15-11)-reports with chart. 20  Justin Porter  01/14/2012   Your procedure is scheduled on: 7-5-  -2013  Report to [facility] at        0800 AM.  Call this number if you have problems the morning of surgery: [phone]   Remember:   Do not eat food:After Midnight.    Take these medicines the morning of surgery with A SIP OF WATER: none- stop fish oil and multivitamin today.   Do not wear jewelry, make-up or nail polish.  Do not wear lotions, powders, or perfumes. You may wear deodorant.  Do not shave 48 hours prior to surgery.(face and neck okay, no shaving of legs)  Do not bring valuables to the hospital.  Contacts, dentures or bridgework may not be worn into surgery.  Leave suitcase in the car. After surgery it may be brought to your room.  For patients admitted to the hospital, checkout time is 11:00 AM the day of discharge.   Patients discharged the day of surgery will not be allowed to drive home.  Name and phone number of your driver: spouse  Special Instructions: CHG Shower Use Special Wash: 1/2 bottle night before surgery and 1/2 bottle morning of surgery.(avoid face and genitals)   Please read over the following fact sheets that you were given: MRSA Information.

## 2012-01-18 ENCOUNTER — Telehealth (INDEPENDENT_AMBULATORY_CARE_PROVIDER_SITE_OTHER): Payer: Self-pay | Admitting: General Surgery

## 2012-01-18 DIAGNOSIS — G5730 Lesion of lateral popliteal nerve, unspecified lower limb: Secondary | ICD-10-CM | POA: Diagnosis not present

## 2012-01-18 NOTE — Telephone Encounter (Signed)
Mrs Marcucci contacted the office stating that she believes her husband has developed an inguinal hernia on the left side. States he is uncomfortable and would like to know if it is possible to have this side repaired also on 01/22/12.

## 2012-01-18 NOTE — Telephone Encounter (Signed)
Returned call to Mrs. Nardone to advise her message was received. Per Dr. Derrell Lolling he will be seen on 01/20/12 during urgent office due to his pending surgery on 01/22/12. Patient will need to be evaluated in order to determine if adjustments may need to be made to surgery performed. Patient spouse agreed.

## 2012-01-20 ENCOUNTER — Ambulatory Visit (INDEPENDENT_AMBULATORY_CARE_PROVIDER_SITE_OTHER): Payer: Medicare Other | Admitting: General Surgery

## 2012-01-20 ENCOUNTER — Encounter (INDEPENDENT_AMBULATORY_CARE_PROVIDER_SITE_OTHER): Payer: Self-pay | Admitting: General Surgery

## 2012-01-20 VITALS — BP 138/72 | HR 64 | Temp 98.3°F | Resp 14 | Ht 72.0 in | Wt 166.4 lb

## 2012-01-20 DIAGNOSIS — K4090 Unilateral inguinal hernia, without obstruction or gangrene, not specified as recurrent: Secondary | ICD-10-CM

## 2012-01-20 NOTE — Progress Notes (Signed)
Patient ID: NIV DARLEY, male   DOB: Dec 07, 1928, 78 y.o.   MRN: [id number]  Chief Complaint  Patient presents with  . Abdominal Pain    HPI Justin Porter is a 78 y.o. male.  He is scheduled for repair of a painful right inguinal hernia on July 5. He came in today because of new-onset of left groin pain.  Past history is significant for a left inguinal hernia repair by me in 2008. When I saw Justin Porter on June 14 there was no problem with the left groin. He now complains of  bilateral groin pain and feels like something is "backing up". No real obvious history of bulge. He is having 3 bowel movements per day and eating okay.  He also has prostate cancer which is metastatic to his lung in the past. Dr. Laverle Patter is evaluating him because of rising PSA. CT scan of the abdomen and pelvis on June 24 shows progressive sclerosis involving the pubic symphysis, possibly representing osseous metastasis or chronic inflammation. A bone scan was done recently also but shows some uptake in the spine but they did not mention any uptake in the pelvic bones. HPI  Past Medical History  Diagnosis Date  . Arthritis   . Hyperlipidemia   . Hypertension   . Hearing loss   . Abdominal pain   . Leg swelling   . Cancer     lung - had 1/3 of left lung removed, did not require additional treatment  . Radiation 18 yrs ago    for increased psa  . LLQ abdominal pain   . Neuromuscular disorder 01-14-12    pinched nerve rt. leg-brace for foot drop    Past Surgical History  Procedure Date  . Elbow surgery     left  surgery for fx 3 yrs ago, right done 40 yrs ago  . Lobectomy 2005  . Prostate surgery 1995  . Lung biopsy 2005    resulted in 1/3 of left lung being removed  . Back surgery 4-5 yrs ago    lower back  . Left knee replacement feb 2012  . Laparoscopic appendectomy 10/15/2011    Procedure: APPENDECTOMY LAPAROSCOPIC;  Surgeon: Almond Lint, MD;  Location: [facility];  Service: General;  Laterality:  N/A;  . Appendectomy 2013  . Hernia repair 01-14-12    2003-left    No family history on file.  Social History History  Substance Use Topics  . Smoking status: Never Smoker   . Smokeless tobacco: Never Used  . Alcohol Use: No    Allergies  Allergen Reactions  . Codeine Nausea And Vomiting  . Shrimp (Shellfish Allergy) Nausea And Vomiting    Current Outpatient Prescriptions  Medication Sig Dispense Refill  . furosemide (LASIX) 20 MG tablet Take 20 mg by mouth 2 (two) times daily.      . Multiple Vitamin (MULITIVITAMIN WITH MINERALS) TABS Take 1 tablet by mouth every morning.      . Omega-3 Fatty Acids (FISH OIL) 1200 MG CAPS Take 1 capsule by mouth daily.       . Potassium 99 MG TABS Take 1 tablet by mouth every morning.      . simvastatin (ZOCOR) 20 MG tablet Take 20 mg by mouth every evening.         Review of Systems Review of Systems  Constitutional: Negative for fever, chills and unexpected weight change.  HENT: Negative for hearing loss, congestion, sore throat, trouble swallowing and voice change.  Eyes: Negative for visual disturbance.  Respiratory: Negative for cough and wheezing.   Cardiovascular: Negative for chest pain, palpitations and leg swelling.  Gastrointestinal: Negative for nausea, vomiting, abdominal pain, diarrhea, constipation, blood in stool, abdominal distention, anal bleeding and rectal pain.  Genitourinary: Negative for hematuria and difficulty urinating.  Musculoskeletal: Positive for myalgias and back pain. Negative for arthralgias and gait problem.  Skin: Negative for rash and wound.  Neurological: Negative for seizures, syncope, weakness and headaches.  Hematological: Negative for adenopathy. Does not bruise/bleed easily.  Psychiatric/Behavioral: Negative for confusion.    Blood pressure 138/72, pulse 64, temperature 98.3 F (36.8 C), temperature source Temporal, resp. rate 14, height 6' (1.829 m), weight 166 lb 6 oz (75.467  kg).  Physical Exam Physical Exam  Cardiovascular: Normal rate, regular rhythm and normal heart sounds.   Pulmonary/Chest: Effort normal. No respiratory distress. He has no wheezes. He has no rales. He exhibits no tenderness.  Abdominal: Soft. Bowel sounds are normal. He exhibits no distension and no mass. There is no tenderness. There is no rebound and no guarding.  Genitourinary:       Lower midline incision from prostatectomy. Left inguinal incision from prior hernia surgery. Laparoscopic scars. Right inguinal hernia, the size of a large egg, reducible. Somewhat tender. No evidence of hernia on the left when standing. Penis scrotum and testes are normal. Symphysis pubis is tender.    Data Reviewed CT and bone scan  Assessment    Symptomatic right inguinal hernia. I still believe that he would benefit from elective repair with mesh  History prostatectomy and pulmonary resection for prostate cancer with lung metastasis.  Reason elevation of PSA. Suspect bony metastasis to spine and. Possible bony metastasis to symphysis pubis  His history recent ruptured appendicitis requiring interval appendectomy following percutaneous drainage, March 2013  Chronic constipation, resolved on MiraLax  Hypertension  History lumbar disc disease and spinal discectomy and fusion    Plan    After lengthy discussion, we have decided to proceed with repair of his right inguinal hernia with mesh day after tomorrow.  There does not appear to be any indication to explore the left groin, as there does not appear to be any evidence of a recurrent left inguinal hernia by exam or by CT scan.  He was advised of the CT scan and bone scan results, and the possible relationship of his pain to bony disease of the symphysis pubis. He was advised to discuss this with Dr. Laverle Patter when he makes his appointment for followup regarding his elevated PSA.       Angelia Mould. Derrell Lolling, M.D., [facility] General and Minimally invasive Surgery Breast and Colorectal Surgery Office:   [phone] Pager:   [phone]  01/20/2012, 1:36 PM

## 2012-01-20 NOTE — H&P (Signed)
Justin Porter    MRN: [id number]   Description: 78 year old male  Provider: Ernestene Mention, MD  Department: [facility]     Diagnoses     Right inguinal hernia   - Primary    550.90      Vitals - Last Recorded     BP Pulse Temp Resp Ht Wt    138/72 64 98.3 F (36.8 C) (Temporal) 14 6' (1.829 m) 166 lb 6 oz (75.467 kg)         BMI - 22.56 kg/m2                 History and Physical   Ernestene Mention, MD   Patient ID: Justin Porter, male   DOB: 05-01-1929, 78 y.o.   MRN: [id number]            HPI Justin Porter is a 78 y.o. male.  He is scheduled for repair of a painful right inguinal hernia on July 5. He came in today because of new-onset of left groin pain.   Past history is significant for a left inguinal hernia repair by me in 2008. When I saw Justin Porter on June 14 there was no problem with the left groin. He now complains of  bilateral groin pain and feels like something is "backing up". No real obvious history of bulge. He is having 3 bowel movements per day and eating okay.   He also has prostate cancer which is metastatic to his lung in the past. Dr. Laverle Patter is evaluating him because of rising PSA. CT scan of the abdomen and pelvis on June 24 shows progressive sclerosis involving the pubic symphysis, possibly representing osseous metastasis or chronic inflammation. A bone scan was done recently also but shows some uptake in the spine but they did not mention any uptake in the pelvic bones.     Past Medical History   Diagnosis  Date   .  Arthritis     .  Hyperlipidemia     .  Hypertension     .  Hearing loss     .  Abdominal pain     .  Leg swelling     .  Cancer         lung - had 1/3 of left lung removed, did not require additional treatment   .  Radiation  18 yrs ago       for increased psa   .  LLQ abdominal pain     .  Neuromuscular disorder  01-14-12       pinched nerve rt. leg-brace for foot drop       Past Surgical History     Procedure  Date   .  Elbow surgery         left  surgery for fx 3 yrs ago, right done 40 yrs ago   .  Lobectomy  2005   .  Prostate surgery  1995   .  Lung biopsy  2005       resulted in 1/3 of left lung being removed   .  Back surgery  4-5 yrs ago       lower back   .  Left knee replacement  feb 2012   .  Laparoscopic appendectomy  10/15/2011       Procedure: APPENDECTOMY LAPAROSCOPIC;  Surgeon: Almond Lint, MD;  Location: [facility];  Service: General;  Laterality: N/A;   .  Appendectomy  2013   .  Hernia repair  01-14-12       2003-left      No family history on file.   Social History History   Substance Use Topics   .  Smoking status:  Never Smoker    .  Smokeless tobacco:  Never Used   .  Alcohol Use:  No       Allergies   Allergen  Reactions   .  Codeine  Nausea And Vomiting   .  Shrimp (Shellfish Allergy)  Nausea And Vomiting       Current Outpatient Prescriptions   Medication  Sig  Dispense  Refill   .  furosemide (LASIX) 20 MG tablet  Take 20 mg by mouth 2 (two) times daily.         .  Multiple Vitamin (MULITIVITAMIN WITH MINERALS) TABS  Take 1 tablet by mouth every morning.         .  Omega-3 Fatty Acids (FISH OIL) 1200 MG CAPS  Take 1 capsule by mouth daily.          .  Potassium 99 MG TABS  Take 1 tablet by mouth every morning.         .  simvastatin (ZOCOR) 20 MG tablet  Take 20 mg by mouth every evening.             Review of Systems  Constitutional: Negative for fever, chills and unexpected weight change.  HENT: Negative for hearing loss, congestion, sore throat, trouble swallowing and voice change.   Eyes: Negative for visual disturbance.  Respiratory: Negative for cough and wheezing.   Cardiovascular: Negative for chest pain, palpitations and leg swelling.  Gastrointestinal: Negative for nausea, vomiting, abdominal pain, diarrhea, constipation, blood in stool, abdominal distention, anal bleeding and rectal pain.  Genitourinary: Negative for hematuria  and difficulty urinating.  Musculoskeletal: Positive for myalgias and back pain. Negative for arthralgias and gait problem.  Skin: Negative for rash and wound.  Neurological: Negative for seizures, syncope, weakness and headaches.  Hematological: Negative for adenopathy. Does not bruise/bleed easily.  Psychiatric/Behavioral: Negative for confusion.    Blood pressure 138/72, pulse 64, temperature 98.3 F (36.8 C), temperature source Temporal, resp. rate 14, height 6' (1.829 m), weight 166 lb 6 oz (75.467 kg).   Physical Exam Physical Exam  Cardiovascular: Normal rate, regular rhythm and normal heart sounds.   Pulmonary/Chest: Effort normal. No respiratory distress. He has no wheezes. He has no rales. He exhibits no tenderness.  Abdominal: Soft. Bowel sounds are normal. He exhibits no distension and no mass. There is no tenderness. There is no rebound and no guarding.  Genitourinary:       Lower midline incision from prostatectomy. Left inguinal incision from prior hernia surgery. Laparoscopic scars. Right inguinal hernia, the size of a large egg, reducible. Somewhat tender. No evidence of hernia on the left when standing. Penis scrotum and testes are normal. Symphysis pubis is tender.    Data Reviewed CT and bone scan   Assessment Symptomatic right inguinal hernia. I still believe that he would benefit from elective repair with mesh   History prostatectomy and pulmonary resection for prostate cancer with lung metastasis.   Reason elevation of PSA. Suspect bony metastasis to spine and. Possible bony metastasis to symphysis pubis   His history recent ruptured appendicitis requiring interval appendectomy following percutaneous drainage, March 2013   Chronic constipation, resolved on MiraLax   Hypertension   History lumbar disc disease and spinal  discectomy and fusion   Plan After lengthy discussion, we have decided to proceed with repair of his right inguinal hernia with  mesh day after tomorrow.   There does not appear to be any indication to explore the left groin, as there does not appear to be any evidence of a recurrent left inguinal hernia by exam or by CT scan.   He was advised of the CT scan and bone scan results, and the possible relationship of his pain to bony disease of the symphysis pubis. He was advised to discuss this with Dr. Laverle Patter when he makes his appointment for followup regarding his elevated PSA.       Angelia Mould. Derrell Lolling, M.D., [facility] General and Minimally invasive Surgery Breast and Colorectal Surgery Office:   [phone] Pager:   [phone]

## 2012-01-20 NOTE — Patient Instructions (Signed)
Your exam today shows the right inguinal hernia. There is no evidence of a left inguinal hernia.  I think that one of the components of your groin pain is disease of your symphysis pubis which is one of the anterior pelvic bones. I do not known whether this is chronic inflammatory change or could be due to prostate cancer. I will defer that to Dr. Laverle Patter.  Our current plan is to proceed with repair of the right internal hernia with mesh on July 5, as planned.

## 2012-01-22 ENCOUNTER — Encounter (HOSPITAL_COMMUNITY): Payer: Self-pay | Admitting: Certified Registered Nurse Anesthetist

## 2012-01-22 ENCOUNTER — Encounter (HOSPITAL_COMMUNITY): Payer: Self-pay | Admitting: *Deleted

## 2012-01-22 ENCOUNTER — Ambulatory Visit (HOSPITAL_COMMUNITY): Payer: Medicare Other | Admitting: Certified Registered Nurse Anesthetist

## 2012-01-22 ENCOUNTER — Ambulatory Visit (HOSPITAL_COMMUNITY)
Admission: RE | Admit: 2012-01-22 | Discharge: 2012-01-22 | Disposition: A | Discharge status: Home / Self Care | Payer: Medicare Other | Source: Ambulatory Visit | Attending: General Surgery | Admitting: General Surgery

## 2012-01-22 ENCOUNTER — Encounter (HOSPITAL_COMMUNITY)
Admission: RE | Disposition: A | Discharge status: Home / Self Care | Payer: Self-pay | Source: Ambulatory Visit | Attending: General Surgery

## 2012-01-22 DIAGNOSIS — Z96659 Presence of unspecified artificial knee joint: Secondary | ICD-10-CM | POA: Insufficient documentation

## 2012-01-22 DIAGNOSIS — Z923 Personal history of irradiation: Secondary | ICD-10-CM | POA: Insufficient documentation

## 2012-01-22 DIAGNOSIS — E785 Hyperlipidemia, unspecified: Secondary | ICD-10-CM | POA: Insufficient documentation

## 2012-01-22 DIAGNOSIS — K4090 Unilateral inguinal hernia, without obstruction or gangrene, not specified as recurrent: Secondary | ICD-10-CM

## 2012-01-22 DIAGNOSIS — I10 Essential (primary) hypertension: Secondary | ICD-10-CM | POA: Insufficient documentation

## 2012-01-22 DIAGNOSIS — Z01812 Encounter for preprocedural laboratory examination: Secondary | ICD-10-CM | POA: Insufficient documentation

## 2012-01-22 DIAGNOSIS — Z9079 Acquired absence of other genital organ(s): Secondary | ICD-10-CM | POA: Insufficient documentation

## 2012-01-22 DIAGNOSIS — R972 Elevated prostate specific antigen [PSA]: Secondary | ICD-10-CM | POA: Insufficient documentation

## 2012-01-22 DIAGNOSIS — Z9089 Acquired absence of other organs: Secondary | ICD-10-CM | POA: Insufficient documentation

## 2012-01-22 DIAGNOSIS — C61 Malignant neoplasm of prostate: Secondary | ICD-10-CM | POA: Insufficient documentation

## 2012-01-22 DIAGNOSIS — Z902 Acquired absence of lung [part of]: Secondary | ICD-10-CM | POA: Insufficient documentation

## 2012-01-22 HISTORY — PX: INGUINAL HERNIA REPAIR: SHX194

## 2012-01-22 SURGERY — REPAIR, HERNIA, INGUINAL, ADULT
Anesthesia: General | Site: Groin | Laterality: Right | Wound class: Clean Contaminated

## 2012-01-22 MED ORDER — MORPHINE SULFATE 10 MG/ML IJ SOLN: 2.0000 mg | INTRAMUSCULAR | Status: DC | PRN

## 2012-01-22 MED ORDER — SUCCINYLCHOLINE CHLORIDE 20 MG/ML IJ SOLN
INTRAMUSCULAR | Status: DC | PRN
  Administered 2012-01-22: 100 mg via INTRAVENOUS

## 2012-01-22 MED ORDER — BUPIVACAINE-EPINEPHRINE (PF) 0.5% -1:200000 IJ SOLN
INTRAMUSCULAR | Status: AC
  Filled 2012-01-22: qty 10

## 2012-01-22 MED ORDER — GLYCOPYRROLATE 0.2 MG/ML IJ SOLN
INTRAMUSCULAR | Status: DC | PRN
  Administered 2012-01-22: .8 mg via INTRAVENOUS

## 2012-01-22 MED ORDER — OXYCODONE HCL 5 MG PO TABS
ORAL_TABLET | ORAL | Status: AC
  Filled 2012-01-22: qty 2

## 2012-01-22 MED ORDER — EPHEDRINE SULFATE 50 MG/ML IJ SOLN
INTRAMUSCULAR | Status: DC | PRN
Start: 2012-01-22 — End: 2012-01-22
  Administered 2012-01-22: 10 mg via INTRAVENOUS

## 2012-01-22 MED ORDER — OXYCODONE HCL 5 MG PO TABS
5.0000 mg | ORAL_TABLET | ORAL | Status: DC | PRN
  Administered 2012-01-22: 10 mg via ORAL

## 2012-01-22 MED ORDER — DEXAMETHASONE SODIUM PHOSPHATE 10 MG/ML IJ SOLN
INTRAMUSCULAR | Status: DC | PRN
  Administered 2012-01-22: 10 mg via INTRAVENOUS

## 2012-01-22 MED ORDER — NEOSTIGMINE METHYLSULFATE 1 MG/ML IJ SOLN
INTRAMUSCULAR | Status: DC | PRN
  Administered 2012-01-22: 5 mg via INTRAVENOUS

## 2012-01-22 MED ORDER — HEPARIN SODIUM (PORCINE) 5000 UNIT/ML IJ SOLN
5000.0000 [IU] | Freq: Once | INTRAMUSCULAR | Status: AC
  Administered 2012-01-22: 5000 [IU] via SUBCUTANEOUS

## 2012-01-22 MED ORDER — FENTANYL CITRATE 0.05 MG/ML IJ SOLN
INTRAMUSCULAR | Status: AC
  Filled 2012-01-22: qty 2

## 2012-01-22 MED ORDER — LACTATED RINGERS IV SOLN
INTRAVENOUS | Status: DC
  Administered 2012-01-22: 1000 mL via INTRAVENOUS
  Administered 2012-01-22: 11:00:00 via INTRAVENOUS

## 2012-01-22 MED ORDER — ACETAMINOPHEN 10 MG/ML IV SOLN
INTRAVENOUS | Status: DC | PRN
  Administered 2012-01-22: 1000 mg via INTRAVENOUS

## 2012-01-22 MED ORDER — ROCURONIUM BROMIDE 100 MG/10ML IV SOLN
INTRAVENOUS | Status: DC | PRN
  Administered 2012-01-22: 35 mg via INTRAVENOUS

## 2012-01-22 MED ORDER — LACTATED RINGERS IV SOLN: INTRAVENOUS | Status: DC

## 2012-01-22 MED ORDER — PROPOFOL 10 MG/ML IV EMUL
INTRAVENOUS | Status: DC | PRN
  Administered 2012-01-22: 130 mg via INTRAVENOUS

## 2012-01-22 MED ORDER — HEPARIN SODIUM (PORCINE) 5000 UNIT/ML IJ SOLN
INTRAMUSCULAR | Status: AC
  Filled 2012-01-22: qty 1

## 2012-01-22 MED ORDER — FENTANYL CITRATE 0.05 MG/ML IJ SOLN
25.0000 ug | INTRAMUSCULAR | Status: DC | PRN
  Administered 2012-01-22 (×2): 25 ug via INTRAVENOUS

## 2012-01-22 MED ORDER — SODIUM CHLORIDE 0.9 % IV SOLN: 250.0000 mL | INTRAVENOUS | Status: DC | PRN

## 2012-01-22 MED ORDER — HYDROCODONE-ACETAMINOPHEN 5-325 MG PO TABS: 1.0000 | ORAL_TABLET | ORAL | Status: AC | PRN

## 2012-01-22 MED ORDER — FENTANYL CITRATE 0.05 MG/ML IJ SOLN
INTRAMUSCULAR | Status: DC | PRN
  Administered 2012-01-22 (×2): 25 ug via INTRAVENOUS
  Administered 2012-01-22 (×2): 50 ug via INTRAVENOUS

## 2012-01-22 MED ORDER — CEFAZOLIN SODIUM-DEXTROSE 2-3 GM-% IV SOLR
2.0000 g | INTRAVENOUS | Status: AC
  Administered 2012-01-22: 2 g via INTRAVENOUS

## 2012-01-22 MED ORDER — ACETAMINOPHEN 10 MG/ML IV SOLN
INTRAVENOUS | Status: AC
  Filled 2012-01-22: qty 100

## 2012-01-22 MED ORDER — ONDANSETRON HCL 4 MG/2ML IJ SOLN: 4.0000 mg | Freq: Four times a day (QID) | INTRAMUSCULAR | Status: DC | PRN

## 2012-01-22 MED ORDER — ACETAMINOPHEN 325 MG PO TABS: 650.0000 mg | ORAL_TABLET | ORAL | Status: DC | PRN

## 2012-01-22 MED ORDER — ACETAMINOPHEN 650 MG RE SUPP
650.0000 mg | RECTAL | Status: DC | PRN
  Filled 2012-01-22: qty 1

## 2012-01-22 MED ORDER — SODIUM CHLORIDE 0.9 % IV SOLN: INTRAVENOUS | Status: DC

## 2012-01-22 MED ORDER — BUPIVACAINE-EPINEPHRINE 0.5% -1:200000 IJ SOLN
INTRAMUSCULAR | Status: DC | PRN
  Administered 2012-01-22: 20 mL

## 2012-01-22 MED ORDER — SODIUM CHLORIDE 0.9 % IJ SOLN: 3.0000 mL | Freq: Two times a day (BID) | INTRAMUSCULAR | Status: DC

## 2012-01-22 MED ORDER — LIDOCAINE HCL (CARDIAC) 20 MG/ML IV SOLN
INTRAVENOUS | Status: DC | PRN
  Administered 2012-01-22: 100 mg via INTRAVENOUS

## 2012-01-22 MED ORDER — SODIUM CHLORIDE 0.9 % IJ SOLN: 3.0000 mL | INTRAMUSCULAR | Status: DC | PRN

## 2012-01-22 MED ORDER — CEFAZOLIN SODIUM-DEXTROSE 2-3 GM-% IV SOLR
INTRAVENOUS | Status: AC
  Filled 2012-01-22: qty 50

## 2012-01-22 MED ORDER — CHLORHEXIDINE GLUCONATE 4 % EX LIQD: 1.0000 "application " | Freq: Once | CUTANEOUS | Status: DC

## 2012-01-22 MED ORDER — ONDANSETRON HCL 4 MG/2ML IJ SOLN
INTRAMUSCULAR | Status: DC | PRN
  Administered 2012-01-22: 4 mg via INTRAVENOUS

## 2012-01-22 SURGICAL SUPPLY — 48 items
ADH SKN CLS APL DERMABOND .7 (GAUZE/BANDAGES/DRESSINGS) ×2
APL SKNCLS STERI-STRIP NONHPOA (GAUZE/BANDAGES/DRESSINGS)
BENZOIN TINCTURE PRP APPL 2/3 (GAUZE/BANDAGES/DRESSINGS) IMPLANT
BLADE HEX COATED 2.75 (ELECTRODE) ×2 IMPLANT
BLADE SURG 15 STRL LF DISP TIS (BLADE) ×1 IMPLANT
BLADE SURG 15 STRL SS (BLADE) ×2
BLADE SURG SZ10 CARB STEEL (BLADE) ×1 IMPLANT
CANISTER SUCTION 2500CC (MISCELLANEOUS) ×2 IMPLANT
CLOTH BEACON ORANGE TIMEOUT ST (SAFETY) ×2 IMPLANT
DECANTER SPIKE VIAL GLASS SM (MISCELLANEOUS) ×2 IMPLANT
DERMABOND ADVANCED (GAUZE/BANDAGES/DRESSINGS) ×2
DERMABOND ADVANCED .7 DNX12 (GAUZE/BANDAGES/DRESSINGS) IMPLANT
DRAIN PENROSE 18X1/2 LTX STRL (DRAIN) ×1 IMPLANT
DRAPE LAPAROTOMY TRNSV 102X78 (DRAPE) ×2 IMPLANT
ELECT REM PT RETURN 9FT ADLT (ELECTROSURGICAL) ×2
ELECTRODE REM PT RTRN 9FT ADLT (ELECTROSURGICAL) ×1 IMPLANT
GLOVE BIOGEL PI IND STRL 7.0 (GLOVE) ×1 IMPLANT
GLOVE BIOGEL PI INDICATOR 7.0 (GLOVE) ×1
GLOVE EUDERMIC 7 POWDERFREE (GLOVE) ×2 IMPLANT
GOWN STRL NON-REIN LRG LVL3 (GOWN DISPOSABLE) ×2 IMPLANT
GOWN STRL REIN XL XLG (GOWN DISPOSABLE) ×4 IMPLANT
KIT BASIN OR (CUSTOM PROCEDURE TRAY) ×2 IMPLANT
MESH ULTRAPRO 3X6 7.6X15CM (Mesh General) ×1 IMPLANT
NDL HYPO 25X1 1.5 SAFETY (NEEDLE) ×1 IMPLANT
NEEDLE HYPO 25X1 1.5 SAFETY (NEEDLE) ×2 IMPLANT
NS IRRIG 1000ML POUR BTL (IV SOLUTION) ×2 IMPLANT
PACK BASIC VI WITH GOWN DISP (CUSTOM PROCEDURE TRAY) ×2 IMPLANT
PENCIL BUTTON HOLSTER BLD 10FT (ELECTRODE) ×2 IMPLANT
SPONGE GAUZE 4X4 12PLY (GAUZE/BANDAGES/DRESSINGS) IMPLANT
SPONGE LAP 4X18 X RAY DECT (DISPOSABLE) ×2 IMPLANT
STAPLER VISISTAT 35W (STAPLE) IMPLANT
STRIP CLOSURE SKIN 1/2X4 (GAUZE/BANDAGES/DRESSINGS) IMPLANT
SUT MNCRL AB 4-0 PS2 18 (SUTURE) ×2 IMPLANT
SUT PROLENE 2 0 CT2 30 (SUTURE) ×6 IMPLANT
SUT SILK 2 0 (SUTURE) ×4
SUT SILK 2 0 SH (SUTURE) IMPLANT
SUT SILK 2-0 18XBRD TIE 12 (SUTURE) ×1 IMPLANT
SUT VIC AB 2-0 SH 27 (SUTURE) ×2
SUT VIC AB 2-0 SH 27X BRD (SUTURE) ×1 IMPLANT
SUT VIC AB 3-0 SH 27 (SUTURE) ×2
SUT VIC AB 3-0 SH 27XBRD (SUTURE) ×1 IMPLANT
SUT VICRYL 2 0 18  UND BR (SUTURE)
SUT VICRYL 2 0 18 UND BR (SUTURE) IMPLANT
SYR BULB IRRIGATION 50ML (SYRINGE) ×2 IMPLANT
SYR CONTROL 10ML LL (SYRINGE) ×2 IMPLANT
TOWEL OR 17X26 10 PK STRL BLUE (TOWEL DISPOSABLE) ×2 IMPLANT
TOWEL OR NON WOVEN STRL DISP B (DISPOSABLE) ×1 IMPLANT
YANKAUER SUCT BULB TIP 10FT TU (MISCELLANEOUS) ×1 IMPLANT

## 2012-01-22 NOTE — Transfer of Care (Signed)
Immediate Anesthesia Transfer of Care Note  Patient: Justin Porter  Procedure(s) Performed: Procedure(s) (LRB): HERNIA REPAIR INGUINAL ADULT (Right) INSERTION OF MESH (Right)  Patient Location: PACU  Anesthesia Type: General  Level of Consciousness: sedated, patient cooperative and responds to stimulaton  Airway & Oxygen Therapy: Patient Spontanous Breathing and Patient connected to face mask oxgen  Post-op Assessment: Report given to PACU RN and Post -op Vital signs reviewed and stable  Post vital signs: Reviewed and stable  Complications: No apparent anesthesia complications

## 2012-01-22 NOTE — Pre-Procedure Instructions (Signed)
Teach Back 

## 2012-01-22 NOTE — Anesthesia Postprocedure Evaluation (Signed)
  Anesthesia Post-op Note  Patient: Justin Porter  Procedure(s) Performed: Procedure(s) (LRB): HERNIA REPAIR INGUINAL ADULT (Right) INSERTION OF MESH (Right)  Patient Location: PACU  Anesthesia Type: General  Level of Consciousness: awake and alert   Airway and Oxygen Therapy: Patient Spontanous Breathing  Post-op Pain: mild  Post-op Assessment: Post-op Vital signs reviewed, Patient's Cardiovascular Status Stable, Respiratory Function Stable, Patent Airway and No signs of Nausea or vomiting  Post-op Vital Signs: stable  Complications: No apparent anesthesia complications

## 2012-01-22 NOTE — Op Note (Signed)
Patient Name:           Justin Porter   Date of Surgery:        01/22/2012  Pre op Diagnosis:      Right inguinal hernia  Post op Diagnosis:    same  Procedure:                 Open repair right inguinal hernia with mesh  Surgeon:                     Angelia Mould. Derrell Lolling, M.D., FACS  Assistant:                      none  Operative Indications:   Justin Porter is a 78 y.o. male. He is an old patient of mine, having undergone a left inguinal hernia repair by me in 2008. He was referred by Dr. Tyson Dense for management of a painful right inguinal hernia.  Significant history is the patient underwent open prostatectomy in the past, as well as lung resection for metastatic prostate cancer by Dr. Edwyna Shell. His PSA has been rising and he has seen Dr. Laverle Patter and he has bone scan and CT scans scheduled for next week. I discussed this with Dr. Laverle Patter and he is going to continue the workup but says that we should proceed with inguinal hernia surgery at any time because of his pain. The patient also is also being evaluated by Dr. 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  Significant recent history is that he had ruptured appendicitis in February and underwent percutaneous drainage. Dr. Donell Beers performed interval laparoscopic appendectomy in March and he has slowly recovered from that surgery. He did show up in the emergency department on May 27 for abdominal pain and was found to simply be constipated. That has improved with daily MiraLax. In  terms of his right inguinal hernia it has been causing pain since February of this year. He notices a bulge. He has not had any nausea or vomiting associated with pain in his right groin. Examination reveals an egg sized right angle hernia which is reducible but somewhat tender. CT scan shows a fat containing right inguinal hernia, no evidence of hernia on the left, and a lot of sclerosis in the symphysis pubis and pubic  rami.  Operative Findings:       He had an indirect right inguinal hernia. Lots of chronic scarring the tissues and separated slowly because of chronic fibrosis and scarring. There were no entrapped contents in the hernia sac.  Procedure in Detail:          Following the induction of general endotracheal anesthesia a surgical time out was performed. The lower abdomen and genitalia were prepped and draped in sterile fashion, and intravenous antibiotics were given. 0.5% Marcaine with epinephrine was used as a local infiltration anesthetic in the superficial and deep tissues. A transverse incision was made in the right groin. Dissection was carried down through the subcutaneous tissue, exposing the aponeurosis of the external oblique. The external oblique was incised in the direction of its fibers, opening up the external inguinal ring. The external oblique was dissected away from the underlying tissues and self-retaining retractors were placed. The cord structures were mobilized and encircled with a Penrose drain. Cremasteric muscle fibers were skeletonized and  an indirect hernia sac was dissected away from surrounding tissues and dissected all the  way back to the level of the internal ring. The indirect sac was opened and inspected and was found to be empty. I was able to  insert my finger through the sac into the peritoneal cavity and felt no particular abnormalities. The indirect sac was twisted and then suture ligated at the level of the internal ring with suture ligature of 2-0 silk. The redundant sac was excised and discarded. The wound was irrigated with saline. The floor of the inguinal canal was repaired and reinforced with a 3" x 6" piece of ultra Pro mesh. The mesh was trimmed at the corners to conform to the anatomy of the wound and the suture placed with a 2-0 Prolene sutures. The mesh was sutured so as to generously overlap the fascia at the pubic tubercle, and then along the inguinal ligament  inferiorly. Medially, superiorly, and superolaterally several mattress sutures of 2-0 Prolene were placed. The mesh was incised laterally so as to wrap around the cord structures at the internal ring. The tails of the mesh were overlapped laterally and further proline  sutures were placed. This provided a very good coverage and repair both medial and lateral to the internal ring but  allowed an adequate fingertip opening for the cord structures. The wound was irrigated with saline. The external oblique was closed with running suture of 2-0 Vicryl, placing the cord structures deep to the external oblique. Scarpa's fascia was closed with 3-0 Vicryl sutures and the skin closed with a running subcuticular suture of 4-0 Monocryl and Dermabond. The patient tolerated the procedure well. He was taken to recovery room stable. There were no complications. Counts correct. EBL 10 cc.     Angelia Mould. Derrell Lolling, M.D., FACS General and Minimally Invasive Surgery Breast and Colorectal Surgery  01/22/2012 10:33 AM

## 2012-01-22 NOTE — Anesthesia Preprocedure Evaluation (Signed)
Anesthesia Evaluation  Patient identified by MRN, date of birth, ID band Patient awake    Reviewed: Allergy & Precautions, H&P , NPO status , Patient's Chart, lab work & pertinent test results  Airway Mallampati: II TM Distance: >3 FB Neck ROM: full    Dental No notable dental hx. (+) Teeth Intact and Dental Advisory Given   Pulmonary neg pulmonary ROS,  breath sounds clear to auscultation  Pulmonary exam normal       Cardiovascular Exercise Tolerance: Good hypertension, Pt. on medications negative cardio ROS  Rhythm:regular Rate:Normal     Neuro/Psych negative neurological ROS  negative psych ROS   GI/Hepatic negative GI ROS, Neg liver ROS,   Endo/Other  negative endocrine ROS  Renal/GU negative Renal ROS  negative genitourinary   Musculoskeletal   Abdominal   Peds  Hematology negative hematology ROS (+)   Anesthesia Other Findings   Reproductive/Obstetrics negative OB ROS                          Anesthesia Physical Anesthesia Plan  ASA: II  Anesthesia Plan: General   Post-op Pain Management:    Induction: Intravenous  Airway Management Planned: Oral ETT  Additional Equipment:   Intra-op Plan:   Post-operative Plan: Extubation in OR  Informed Consent: I have reviewed the patients History and Physical, chart, labs and discussed the procedure including the risks, benefits and alternatives for the proposed anesthesia with the patient or authorized representative who has indicated his/her understanding and acceptance.   Dental Advisory Given  Plan Discussed with: CRNA and Surgeon  Anesthesia Plan Comments:        Anesthesia Quick Evaluation  

## 2012-01-22 NOTE — Interval H&P Note (Signed)
History and Physical Interval Note:  01/22/2012 9:05 AM  Justin Porter  has presented today for surgery, with the diagnosis of right inguinal hernia  The goals of treatment and the various methods of treatment have been discussed with the patient and family. After consideration of risks, benefits and other options for treatment, the patient has consented to  Procedure(s) (LRB): HERNIA REPAIR INGUINAL ADULT (Right) INSERTION OF MESH (N/A) as a surgical intervention .  The patient's history has been reviewed, patient examined, no change in status, stable for surgery.  I have reviewed the patients' chart and labs.  Questions were answered to the patient's satisfaction.     Ernestene Mention

## 2012-01-25 ENCOUNTER — Encounter (HOSPITAL_COMMUNITY): Payer: Self-pay | Admitting: General Surgery

## 2012-01-27 DIAGNOSIS — C61 Malignant neoplasm of prostate: Secondary | ICD-10-CM | POA: Diagnosis not present

## 2012-02-10 DIAGNOSIS — R21 Rash and other nonspecific skin eruption: Secondary | ICD-10-CM | POA: Diagnosis not present

## 2012-02-10 DIAGNOSIS — Z1331 Encounter for screening for depression: Secondary | ICD-10-CM | POA: Diagnosis not present

## 2012-02-10 DIAGNOSIS — I10 Essential (primary) hypertension: Secondary | ICD-10-CM | POA: Diagnosis not present

## 2012-02-10 DIAGNOSIS — R7309 Other abnormal glucose: Secondary | ICD-10-CM | POA: Diagnosis not present

## 2012-02-10 DIAGNOSIS — E782 Mixed hyperlipidemia: Secondary | ICD-10-CM | POA: Diagnosis not present

## 2012-02-10 DIAGNOSIS — C61 Malignant neoplasm of prostate: Secondary | ICD-10-CM | POA: Diagnosis not present

## 2012-02-10 DIAGNOSIS — Z0000 Encounter for general adult medical examination without abnormal findings: Secondary | ICD-10-CM | POA: Diagnosis not present

## 2012-02-10 DIAGNOSIS — M1990 Unspecified osteoarthritis, unspecified site: Secondary | ICD-10-CM | POA: Diagnosis not present

## 2012-02-18 ENCOUNTER — Encounter (INDEPENDENT_AMBULATORY_CARE_PROVIDER_SITE_OTHER): Payer: Self-pay | Admitting: General Surgery

## 2012-02-18 ENCOUNTER — Ambulatory Visit (INDEPENDENT_AMBULATORY_CARE_PROVIDER_SITE_OTHER): Payer: Medicare Other | Admitting: General Surgery

## 2012-02-18 VITALS — BP 142/68 | HR 64 | Temp 98.0°F | Resp 20 | Ht 72.0 in | Wt 163.4 lb

## 2012-02-18 DIAGNOSIS — K4090 Unilateral inguinal hernia, without obstruction or gangrene, not specified as recurrent: Secondary | ICD-10-CM

## 2012-02-18 NOTE — Patient Instructions (Signed)
You are recovering from your right inguinal hernia surgery without any obvious complications.  You may resume all normal physical activities including playing golf on August 8.  Return to see Dr. Derrell Lolling if further problems arise.

## 2012-02-18 NOTE — Progress Notes (Signed)
Subjective:     Patient ID: Justin Porter, male   DOB: 1929/05/23, 78 y.o.   MRN: [id number]  HPI This gentleman underwent open repair of right inguinal hernia with mesh on July 5. He got a little skin rash but that is resolving. No infection. Pain is resolving nicely. We talked about resuming golf. He is walking a mile daily on his treadmill. He is now on hormones for his metastatic prostate cancer and is being followed by Dr. Laverle Patter.  Review of Systems     Objective:   Physical Exam Patient looks well. His wife is with him. Good spirits. The end.  Right groin incision is healing without any obvious complication. Skin rash has almost completely resolved. No infection. No fluid collection. Hernia repair is intact. Penis scrotum and testes looked normal.    Assessment:     Right inguinal hernia, recovering uneventfully following open repair with mesh.    Plan:     Diet and activities discussed. Resume all normal physical activities including golf on August 8.  Return to see me when necessary.   Angelia Mould. Derrell Lolling, M.D., [facility] General and Minimally invasive Surgery Breast and Colorectal Surgery Office:   [phone] Pager:   [phone]

## 2012-02-29 DIAGNOSIS — G5730 Lesion of lateral popliteal nerve, unspecified lower limb: Secondary | ICD-10-CM | POA: Diagnosis not present

## 2012-03-07 DIAGNOSIS — G5730 Lesion of lateral popliteal nerve, unspecified lower limb: Secondary | ICD-10-CM | POA: Diagnosis not present

## 2012-03-16 DIAGNOSIS — H2510 Age-related nuclear cataract, unspecified eye: Secondary | ICD-10-CM | POA: Diagnosis not present

## 2012-03-16 DIAGNOSIS — H43819 Vitreous degeneration, unspecified eye: Secondary | ICD-10-CM | POA: Diagnosis not present

## 2012-03-16 DIAGNOSIS — G43809 Other migraine, not intractable, without status migrainosus: Secondary | ICD-10-CM | POA: Diagnosis not present

## 2012-03-23 DIAGNOSIS — G5730 Lesion of lateral popliteal nerve, unspecified lower limb: Secondary | ICD-10-CM | POA: Diagnosis not present

## 2012-03-26 DIAGNOSIS — S0190XA Unspecified open wound of unspecified part of head, initial encounter: Secondary | ICD-10-CM | POA: Diagnosis not present

## 2012-04-04 DIAGNOSIS — C61 Malignant neoplasm of prostate: Secondary | ICD-10-CM | POA: Diagnosis not present

## 2012-04-11 DIAGNOSIS — M1710 Unilateral primary osteoarthritis, unspecified knee: Secondary | ICD-10-CM | POA: Diagnosis not present

## 2012-04-11 DIAGNOSIS — G5730 Lesion of lateral popliteal nerve, unspecified lower limb: Secondary | ICD-10-CM | POA: Diagnosis not present

## 2012-05-25 DIAGNOSIS — M899 Disorder of bone, unspecified: Secondary | ICD-10-CM | POA: Diagnosis not present

## 2012-05-25 DIAGNOSIS — C61 Malignant neoplasm of prostate: Secondary | ICD-10-CM | POA: Diagnosis not present

## 2012-05-25 DIAGNOSIS — M949 Disorder of cartilage, unspecified: Secondary | ICD-10-CM | POA: Diagnosis not present

## 2012-06-01 DIAGNOSIS — C61 Malignant neoplasm of prostate: Secondary | ICD-10-CM | POA: Diagnosis not present

## 2012-06-01 DIAGNOSIS — E291 Testicular hypofunction: Secondary | ICD-10-CM | POA: Diagnosis not present

## 2012-07-19 DIAGNOSIS — S61409A Unspecified open wound of unspecified hand, initial encounter: Secondary | ICD-10-CM | POA: Diagnosis not present

## 2012-08-03 DIAGNOSIS — C61 Malignant neoplasm of prostate: Secondary | ICD-10-CM | POA: Diagnosis not present

## 2012-08-03 DIAGNOSIS — M1990 Unspecified osteoarthritis, unspecified site: Secondary | ICD-10-CM | POA: Diagnosis not present

## 2012-08-03 DIAGNOSIS — I10 Essential (primary) hypertension: Secondary | ICD-10-CM | POA: Diagnosis not present

## 2012-08-03 DIAGNOSIS — E782 Mixed hyperlipidemia: Secondary | ICD-10-CM | POA: Diagnosis not present

## 2012-09-21 DIAGNOSIS — M949 Disorder of cartilage, unspecified: Secondary | ICD-10-CM | POA: Diagnosis not present

## 2012-09-21 DIAGNOSIS — C61 Malignant neoplasm of prostate: Secondary | ICD-10-CM | POA: Diagnosis not present

## 2012-09-21 DIAGNOSIS — E291 Testicular hypofunction: Secondary | ICD-10-CM | POA: Diagnosis not present

## 2012-11-11 DIAGNOSIS — M25569 Pain in unspecified knee: Secondary | ICD-10-CM | POA: Diagnosis not present

## 2013-01-18 DIAGNOSIS — E291 Testicular hypofunction: Secondary | ICD-10-CM | POA: Diagnosis not present

## 2013-01-18 DIAGNOSIS — C61 Malignant neoplasm of prostate: Secondary | ICD-10-CM | POA: Diagnosis not present

## 2013-01-18 DIAGNOSIS — M949 Disorder of cartilage, unspecified: Secondary | ICD-10-CM | POA: Diagnosis not present

## 2013-01-18 DIAGNOSIS — C7951 Secondary malignant neoplasm of bone: Secondary | ICD-10-CM | POA: Diagnosis not present

## 2013-01-18 DIAGNOSIS — C7952 Secondary malignant neoplasm of bone marrow: Secondary | ICD-10-CM | POA: Diagnosis not present

## 2013-02-01 DIAGNOSIS — C61 Malignant neoplasm of prostate: Secondary | ICD-10-CM | POA: Diagnosis not present

## 2013-02-01 DIAGNOSIS — R7989 Other specified abnormal findings of blood chemistry: Secondary | ICD-10-CM | POA: Diagnosis not present

## 2013-02-01 DIAGNOSIS — Z0000 Encounter for general adult medical examination without abnormal findings: Secondary | ICD-10-CM | POA: Diagnosis not present

## 2013-02-01 DIAGNOSIS — Z1331 Encounter for screening for depression: Secondary | ICD-10-CM | POA: Diagnosis not present

## 2013-02-01 DIAGNOSIS — I10 Essential (primary) hypertension: Secondary | ICD-10-CM | POA: Diagnosis not present

## 2013-02-01 DIAGNOSIS — M1990 Unspecified osteoarthritis, unspecified site: Secondary | ICD-10-CM | POA: Diagnosis not present

## 2013-02-01 DIAGNOSIS — E782 Mixed hyperlipidemia: Secondary | ICD-10-CM | POA: Diagnosis not present

## 2013-04-18 DIAGNOSIS — C61 Malignant neoplasm of prostate: Secondary | ICD-10-CM | POA: Diagnosis not present

## 2013-05-26 DIAGNOSIS — C61 Malignant neoplasm of prostate: Secondary | ICD-10-CM | POA: Diagnosis not present

## 2013-06-22 ENCOUNTER — Other Ambulatory Visit (HOSPITAL_COMMUNITY): Payer: Self-pay | Admitting: Urology

## 2013-06-22 DIAGNOSIS — C61 Malignant neoplasm of prostate: Secondary | ICD-10-CM

## 2013-07-21 DIAGNOSIS — C61 Malignant neoplasm of prostate: Secondary | ICD-10-CM | POA: Diagnosis not present

## 2013-07-24 ENCOUNTER — Encounter (HOSPITAL_COMMUNITY)
Admission: RE | Admit: 2013-07-24 | Discharge: 2013-07-24 | Disposition: A | Discharge status: Home / Self Care | Payer: Medicare Other | Source: Ambulatory Visit | Attending: Urology | Admitting: Urology

## 2013-07-24 DIAGNOSIS — C61 Malignant neoplasm of prostate: Secondary | ICD-10-CM | POA: Diagnosis not present

## 2013-07-24 MED ORDER — TECHNETIUM TC 99M MEDRONATE IV KIT
27.0000 | PACK | Freq: Once | INTRAVENOUS | Status: AC | PRN
  Administered 2013-07-24: 27 via INTRAVENOUS

## 2013-07-28 DIAGNOSIS — M899 Disorder of bone, unspecified: Secondary | ICD-10-CM | POA: Diagnosis not present

## 2013-07-28 DIAGNOSIS — C61 Malignant neoplasm of prostate: Secondary | ICD-10-CM | POA: Diagnosis not present

## 2013-07-28 DIAGNOSIS — C7951 Secondary malignant neoplasm of bone: Secondary | ICD-10-CM | POA: Diagnosis not present

## 2013-07-28 DIAGNOSIS — M949 Disorder of cartilage, unspecified: Secondary | ICD-10-CM | POA: Diagnosis not present

## 2013-07-28 DIAGNOSIS — C7952 Secondary malignant neoplasm of bone marrow: Secondary | ICD-10-CM | POA: Diagnosis not present

## 2013-08-02 DIAGNOSIS — I10 Essential (primary) hypertension: Secondary | ICD-10-CM | POA: Diagnosis not present

## 2013-08-02 DIAGNOSIS — E782 Mixed hyperlipidemia: Secondary | ICD-10-CM | POA: Diagnosis not present

## 2013-08-02 DIAGNOSIS — Z23 Encounter for immunization: Secondary | ICD-10-CM | POA: Diagnosis not present

## 2013-08-02 DIAGNOSIS — C61 Malignant neoplasm of prostate: Secondary | ICD-10-CM | POA: Diagnosis not present

## 2013-08-25 DIAGNOSIS — Z85828 Personal history of other malignant neoplasm of skin: Secondary | ICD-10-CM | POA: Diagnosis not present

## 2013-08-25 DIAGNOSIS — L570 Actinic keratosis: Secondary | ICD-10-CM | POA: Diagnosis not present

## 2013-09-28 ENCOUNTER — Other Ambulatory Visit: Payer: Self-pay | Admitting: Internal Medicine

## 2013-09-28 DIAGNOSIS — R1032 Left lower quadrant pain: Secondary | ICD-10-CM | POA: Diagnosis not present

## 2013-09-28 DIAGNOSIS — R109 Unspecified abdominal pain: Secondary | ICD-10-CM | POA: Diagnosis not present

## 2013-09-29 ENCOUNTER — Ambulatory Visit
Admission: RE | Admit: 2013-09-29 | Discharge: 2013-09-29 | Disposition: A | Discharge status: Home / Self Care | Payer: Medicare Other | Source: Ambulatory Visit | Attending: Internal Medicine | Admitting: Internal Medicine

## 2013-09-29 DIAGNOSIS — R1032 Left lower quadrant pain: Secondary | ICD-10-CM | POA: Diagnosis not present

## 2013-09-29 DIAGNOSIS — R1904 Left lower quadrant abdominal swelling, mass and lump: Secondary | ICD-10-CM | POA: Diagnosis not present

## 2013-09-29 MED ORDER — IOHEXOL 300 MG/ML  SOLN
100.0000 mL | Freq: Once | INTRAMUSCULAR | Status: AC | PRN
  Administered 2013-09-29: 100 mL via INTRAVENOUS

## 2013-10-04 DIAGNOSIS — M169 Osteoarthritis of hip, unspecified: Secondary | ICD-10-CM | POA: Diagnosis not present

## 2013-10-04 DIAGNOSIS — M1610 Unilateral primary osteoarthritis, unspecified hip: Secondary | ICD-10-CM | POA: Diagnosis not present

## 2013-10-09 DIAGNOSIS — H903 Sensorineural hearing loss, bilateral: Secondary | ICD-10-CM | POA: Diagnosis not present

## 2013-10-11 DIAGNOSIS — M169 Osteoarthritis of hip, unspecified: Secondary | ICD-10-CM | POA: Diagnosis not present

## 2013-10-11 DIAGNOSIS — M1610 Unilateral primary osteoarthritis, unspecified hip: Secondary | ICD-10-CM | POA: Diagnosis not present

## 2013-11-01 DIAGNOSIS — C61 Malignant neoplasm of prostate: Secondary | ICD-10-CM | POA: Diagnosis not present

## 2013-11-08 DIAGNOSIS — C7951 Secondary malignant neoplasm of bone: Secondary | ICD-10-CM | POA: Diagnosis not present

## 2013-11-08 DIAGNOSIS — M899 Disorder of bone, unspecified: Secondary | ICD-10-CM | POA: Diagnosis not present

## 2013-11-08 DIAGNOSIS — C61 Malignant neoplasm of prostate: Secondary | ICD-10-CM | POA: Diagnosis not present

## 2013-11-14 ENCOUNTER — Encounter (INDEPENDENT_AMBULATORY_CARE_PROVIDER_SITE_OTHER): Payer: Self-pay

## 2013-11-14 ENCOUNTER — Other Ambulatory Visit: Payer: Self-pay

## 2013-11-14 ENCOUNTER — Encounter (HOSPITAL_COMMUNITY): Payer: Self-pay

## 2013-11-14 ENCOUNTER — Encounter (HOSPITAL_COMMUNITY)
Admission: RE | Admit: 2013-11-14 | Discharge: 2013-11-14 | Disposition: A | Discharge status: Home / Self Care | Payer: Medicare Other | Source: Ambulatory Visit | Attending: Orthopedic Surgery | Admitting: Orthopedic Surgery

## 2013-11-14 ENCOUNTER — Encounter (HOSPITAL_COMMUNITY): Payer: Self-pay | Admitting: Pharmacy Technician

## 2013-11-14 ENCOUNTER — Ambulatory Visit (HOSPITAL_COMMUNITY)
Admission: RE | Admit: 2013-11-14 | Discharge: 2013-11-14 | Disposition: A | Discharge status: Home / Self Care | Payer: Medicare Other | Source: Ambulatory Visit | Attending: Anesthesiology | Admitting: Anesthesiology

## 2013-11-14 DIAGNOSIS — Z01818 Encounter for other preprocedural examination: Secondary | ICD-10-CM | POA: Insufficient documentation

## 2013-11-14 DIAGNOSIS — Z01810 Encounter for preprocedural cardiovascular examination: Secondary | ICD-10-CM | POA: Insufficient documentation

## 2013-11-14 DIAGNOSIS — Z01812 Encounter for preprocedural laboratory examination: Secondary | ICD-10-CM | POA: Diagnosis not present

## 2013-11-14 DIAGNOSIS — I10 Essential (primary) hypertension: Secondary | ICD-10-CM | POA: Diagnosis not present

## 2013-11-14 DIAGNOSIS — M169 Osteoarthritis of hip, unspecified: Secondary | ICD-10-CM | POA: Diagnosis not present

## 2013-11-14 DIAGNOSIS — M1610 Unilateral primary osteoarthritis, unspecified hip: Secondary | ICD-10-CM | POA: Diagnosis not present

## 2013-11-14 LAB — BASIC METABOLIC PANEL
BUN: 13 mg/dL (ref 6–23)
CO2: 32 mEq/L (ref 19–32)
CREATININE: 0.95 mg/dL (ref 0.50–1.35)
Calcium: 9.5 mg/dL (ref 8.4–10.5)
Chloride: 97 mEq/L (ref 96–112)
GFR, EST AFRICAN AMERICAN: 87 mL/min — AB (ref >90)
GFR, EST NON AFRICAN AMERICAN: 75 mL/min — AB (ref >90)
GLUCOSE: 87 mg/dL (ref 70–99)
Potassium: 4.5 mEq/L (ref 3.7–5.3)
Sodium: 139 mEq/L (ref 137–147)

## 2013-11-14 LAB — PROTIME-INR
INR: 1.06 (ref 0.00–1.49)
Prothrombin Time: 13.6 seconds (ref 11.6–15.2)

## 2013-11-14 LAB — CBC
HEMATOCRIT: 40.3 % (ref 39.0–52.0)
Hemoglobin: 13.7 g/dL (ref 13.0–17.0)
MCH: 31.5 pg (ref 26.0–34.0)
MCHC: 34 g/dL (ref 30.0–36.0)
MCV: 92.6 fL (ref 78.0–100.0)
Platelets: 282 10*3/uL (ref 150–400)
RBC: 4.35 MIL/uL (ref 4.22–5.81)
RDW: 12.5 % (ref 11.5–15.5)
WBC: 6.6 10*3/uL (ref 4.0–10.5)

## 2013-11-14 LAB — APTT: aPTT: 31 seconds (ref 24–37)

## 2013-11-14 LAB — URINALYSIS, ROUTINE W REFLEX MICROSCOPIC
BILIRUBIN URINE: NEGATIVE
Glucose, UA: NEGATIVE mg/dL
Hgb urine dipstick: NEGATIVE
KETONES UR: NEGATIVE mg/dL
Leukocytes, UA: NEGATIVE
NITRITE: NEGATIVE
Protein, ur: NEGATIVE mg/dL
Specific Gravity, Urine: 1.006 (ref 1.005–1.030)
Urobilinogen, UA: 0.2 mg/dL (ref 0.0–1.0)
pH: 7.5 (ref 5.0–8.0)

## 2013-11-14 LAB — SURGICAL PCR SCREEN
MRSA, PCR: NEGATIVE
Staphylococcus aureus: NEGATIVE

## 2013-11-14 NOTE — Patient Instructions (Addendum)
Justin Porter  11/14/2013                           YOUR PROCEDURE IS SCHEDULED ON: 11/28/13 at 9:00 AM               PLEASE REPORT TO SHORT STAY CENTER AT : 6:00 AM               CALL THIS NUMBER IF ANY PROBLEMS THE DAY OF SURGERY :               [phone]                                REMEMBER:   Do not eat food or drink liquids AFTER MIDNIGHT               Take these medicines the morning of surgery with               A SIPS OF WATER :  NONE      Do not wear jewelry, make-up   Do not wear lotions, powders, or perfumes.   Do not shave legs or underarms 12 hrs. before surgery (men may shave face)  Do not bring valuables to the hospital.  Contacts, dentures or bridgework may not be worn into surgery.  Leave suitcase in the car. After surgery it may be brought to your room.  For patients admitted to the hospital more than one night, checkout time is            11:00 AM                                                   ________________________________________________________________________                                                                                                    [facility] - Preparing for Surgery Before surgery, you can play an important role.  Because skin is not sterile, your skin needs to be as free of germs as possible.  You can reduce the number of germs on your skin by washing with CHG (chlorahexidine gluconate) soap before surgery.  CHG is an antiseptic cleaner which kills germs and bonds with the skin to continue killing germs even after washing. Please DO NOT use if you have an allergy to CHG or antibacterial soaps.  If your skin becomes reddened/irritated stop using the CHG and inform your nurse when you arrive at Short Stay. Do not shave (including legs and underarms) for at least 48 hours prior to the first CHG shower.  You may shave your face. Please follow these instructions carefully:  1.  Shower with CHG Soap the night before  surgery and the  morning of Surgery.   2.  If you choose to wash your hair,  wash your hair first as usual with your  normal  Shampoo.   3.  After you shampoo, rinse your hair and body thoroughly to remove the  shampoo.                                         4.  Use CHG as you would any other liquid soap.  You can apply chg directly  to the skin and wash . Gently wash with scrungie or clean wascloth    5.  Apply the CHG Soap to your body ONLY FROM THE NECK DOWN.   Do not use on open                           Wound or open sores. Avoid contact with eyes, ears mouth and genitals (private parts).                        Genitals (private parts) with your normal soap.              6.  Wash thoroughly, paying special attention to the area where your surgery  will be performed.   7.  Thoroughly rinse your body with warm water from the neck down.   8.  DO NOT shower/wash with your normal soap after using and rinsing off  the CHG Soap .                9.  Pat yourself dry with a clean towel.             10.  Wear clean pajamas.             11.  Place clean sheets on your bed the night of your first shower and do not  sleep with pets.  Day of Surgery : Do not apply any lotions/deodorants the morning of surgery.  Please wear clean clothes to the hospital/surgery center.  FAILURE TO FOLLOW THESE INSTRUCTIONS MAY RESULT IN THE CANCELLATION OF YOUR SURGERY    PATIENT SIGNATURE_________________________________  _______________________________________________________________________________________________________________________________     Justin Porter  An incentive spirometer is a tool that can help keep your lungs clear and active. This tool measures how well you are filling your lungs with each breath. Taking long deep breaths may help reverse or decrease the chance of developing breathing (pulmonary) problems (especially infection) following:  A long period of time when you  are unable to move or be active. BEFORE THE PROCEDURE   If the spirometer includes an indicator to show your best effort, your nurse or respiratory therapist will set it to a desired goal.  If possible, sit up straight or lean slightly forward. Try not to slouch.  Hold the incentive spirometer in an upright position. INSTRUCTIONS FOR USE  1. Sit on the edge of your bed if possible, or sit up as far as you can in bed or on a chair. 2. Hold the incentive spirometer in an upright position. 3. Breathe out normally. 4. Place the mouthpiece in your mouth and seal your lips tightly around it. 5. Breathe in slowly and as deeply as possible, raising the piston or the ball toward the top of the column. 6. Hold your breath for 3-5 seconds or for as long as possible. Allow the piston or ball to fall to the bottom of  the column. 7. Remove the mouthpiece from your mouth and breathe out normally. 8. Rest for a few seconds and repeat Steps 1 through 7 at least 10 times every 1-2 hours when you are awake. Take your time and take a few normal breaths between deep breaths. 9. The spirometer may include an indicator to show your best effort. Use the indicator as a goal to work toward during each repetition. 10. After each set of 10 deep breaths, practice coughing to be sure your lungs are clear. If you have an incision (the cut made at the time of surgery), support your incision when coughing by placing a pillow or rolled up towels firmly against it. Once you are able to get out of bed, walk around indoors and cough well. You may stop using the incentive spirometer when instructed by your caregiver.  RISKS AND COMPLICATIONS  Take your time so you do not get dizzy or light-headed.  If you are in pain, you may need to take or ask for pain medication before doing incentive spirometry. It is harder to take a deep breath if you are having pain. AFTER USE  Rest and breathe slowly and easily.  It can be helpful to  keep track of a log of your progress. Your caregiver can provide you with a simple table to help with this. If you are using the spirometer at home, follow these instructions: [place] IF:   You are having difficultly using the spirometer.  You have trouble using the spirometer as often as instructed.  Your pain medication is not giving enough relief while using the spirometer.  You develop fever of 100.5 F (38.1 C) or higher. SEEK IMMEDIATE MEDICAL CARE IF:   You cough up bloody sputum that had not been present before.  You develop fever of 102 F (38.9 C) or greater.  You develop worsening pain at or near the incision site. MAKE SURE YOU:   Understand these instructions.  Will watch your condition.  Will get help right away if you are not doing well or get worse. Document Released: 11/16/2006 Document Revised: 09/28/2011 Document Reviewed: 01/17/2007 [employer] Patient Information 2014 [employer], [place].   ________________________________________________________________________  WHAT IS A BLOOD TRANSFUSION? Blood Transfusion Information  A transfusion is the replacement of blood or some of its parts. Blood is made up of multiple cells which provide different functions.  Red blood cells carry oxygen and are used for blood loss replacement.  White blood cells fight against infection.  Platelets control bleeding.  Plasma helps clot blood.  Other blood products are available for specialized needs, such as hemophilia or other clotting disorders. BEFORE THE TRANSFUSION  Who gives blood for transfusions?   Healthy volunteers who are fully evaluated to make sure their blood is safe. This is blood bank blood. Transfusion therapy is the safest it has ever been in the practice of medicine. Before blood is taken from a donor, a complete history is taken to make sure that person has no history of diseases nor engages in risky social behavior (examples are intravenous drug  use or sexual activity with multiple partners). The donor's travel history is screened to minimize risk of transmitting infections, such as malaria. The donated blood is tested for signs of infectious diseases, such as HIV and hepatitis. The blood is then tested to be sure it is compatible with you in order to minimize the chance of a transfusion reaction. If you or a relative donates blood, this is often  done in anticipation of surgery and is not appropriate for emergency situations. It takes many days to process the donated blood. RISKS AND COMPLICATIONS Although transfusion therapy is very safe and saves many lives, the main dangers of transfusion include:   Getting an infectious disease.  Developing a transfusion reaction. This is an allergic reaction to something in the blood you were given. Every precaution is taken to prevent this. The decision to have a blood transfusion has been considered carefully by your caregiver before blood is given. Blood is not given unless the benefits outweigh the risks. AFTER THE TRANSFUSION  Right after receiving a blood transfusion, you will usually feel much better and more energetic. This is especially true if your red blood cells have gotten low (anemic). The transfusion raises the level of the red blood cells which carry oxygen, and this usually causes an energy increase.  The nurse administering the transfusion will monitor you carefully for complications. HOME CARE INSTRUCTIONS  No special instructions are needed after a transfusion. You may find your energy is better. Speak with your caregiver about any limitations on activity for underlying diseases you may have. SEEK MEDICAL CARE IF:   Your condition is not improving after your transfusion.  You develop redness or irritation at the intravenous (IV) site. SEEK IMMEDIATE MEDICAL CARE IF:  Any of the following symptoms occur over the next 12 hours:  Shaking chills.  You have a temperature by mouth  above 102 F (38.9 C), not controlled by medicine.  Chest, back, or muscle pain.  People around you feel you are not acting correctly or are confused.  Shortness of breath or difficulty breathing.  Dizziness and fainting.  You get a rash or develop hives.  You have a decrease in urine output.  Your urine turns a dark color or changes to pink, red, or brown. Any of the following symptoms occur over the next 10 days:  You have a temperature by mouth above 102 F (38.9 C), not controlled by medicine.  Shortness of breath.  Weakness after normal activity.  The white part of the eye turns yellow (jaundice).  You have a decrease in the amount of urine or are urinating less often.  Your urine turns a dark color or changes to pink, red, or brown. Document Released: 07/03/2000 Document Revised: 09/28/2011 Document Reviewed: 02/20/2008 [facility] Patient Information 2014 [place], [place].  _______________________________________________________________________

## 2013-11-14 NOTE — Progress Notes (Signed)
CXR faxed to Dr. Alvan Dame

## 2013-11-26 NOTE — H&P (Signed)
TOTAL HIP ADMISSION H&P  Patient is admitted for left total hip arthroplasty, anterior approach.  Subjective:  Chief Complaint:    Left hip OA / pain  HPI: Justin Porter, 80 y.o. male, has a history of pain and functional disability in the left hip(s) due to arthritis and patient has failed non-surgical conservative treatments for greater than 12 weeks to include NSAID's and/or analgesics, use of assistive devices and activity modification.  Onset of symptoms was gradual starting 1 years ago with gradually worsening course since that time.The patient noted no past surgery on the left hip(s).  Patient currently rates pain in the left hip at 9 out of 10 with activity. Patient has night pain, worsening of pain with activity and weight bearing, trendelenberg gait, pain that interfers with activities of daily living and pain with passive range of motion. Patient has evidence of periarticular osteophytes and joint space narrowing by imaging studies. This condition presents safety issues increasing the risk of falls. There is no current active infection.  Risks, benefits and expectations were discussed with the patient.  Risks including but not limited to the risk of anesthesia, blood clots, nerve damage, blood vessel damage, failure of the prosthesis, infection and up to and including death.  Patient understand the risks, benefits and expectations and wishes to proceed with surgery.   D/C Plans:   SNF - [facility]  Post-op Meds:     No Rx given  Tranexamic Acid:   To be given  Decadron:    To be given  FYI:    Order Chest CT while in hospital, do to findings on chest x-ray  ASA post-op  Norco post-op   Patient Active Problem List   Diagnosis Date Noted  . Right inguinal hernia 01/01/2012  . Ruptured appendicitis 11/09/2011  . History of prostate cancer 09/15/2011  . History of lung cancer 09/15/2011  . HTN (hypertension) 09/15/2011   Past Medical History  Diagnosis Date  . Arthritis   .  Hyperlipidemia   . Hypertension   . Hearing loss   . Radiation 18 yrs ago    for increased psa  . Cancer     lung - had 1/3 of left lung removed, did not require additional treatment  . History of prostate cancer     Past Surgical History  Procedure Laterality Date  . Elbow surgery      left  surgery for fx 3 yrs ago, right done 40 yrs ago  . Lobectomy  2005  . Prostate surgery  1995  . Lung biopsy  2005    resulted in 1/3 of left lung being removed  . Back surgery  4-5 yrs ago    lower back  . Laparoscopic appendectomy  10/15/2011    Procedure: APPENDECTOMY LAPAROSCOPIC;  Surgeon: Stark Klein, MD;  Location: [facility];  Service: General;  Laterality: N/A;  . Appendectomy  2013  . Hernia repair  01-14-12    2003-left  . Inguinal hernia repair  01/22/2012    Procedure: HERNIA REPAIR INGUINAL ADULT;  Surgeon: Adin Hector, MD;  Location: [facility];  Service: General;  Laterality: Right;  . Joint replacement  2012    L TOTAL KNEE    No prescriptions prior to admission   Allergies  Allergen Reactions  . Codeine Nausea And Vomiting  . Shrimp [Shellfish Allergy] Nausea And Vomiting    History  Substance Use Topics  . Smoking status: Never Smoker   . Smokeless tobacco: Never Used  .  Alcohol Use: No    No family history on file.   Review of Systems  Constitutional: Negative.   HENT: Negative.   Eyes: Negative.   Respiratory: Negative.   Cardiovascular: Negative.   Gastrointestinal: Negative.   Genitourinary: Negative.   Musculoskeletal: Positive for joint pain.  Skin: Negative.   Neurological: Negative.   Endo/Heme/Allergies: Negative.   Psychiatric/Behavioral: Negative.     Objective:  Physical Exam  Constitutional: He is oriented to person, place, and time. He appears well-developed and well-nourished.  HENT:  Head: Normocephalic and atraumatic.  Mouth/Throat: Oropharynx is clear and moist.  Eyes: Pupils are equal, round, and reactive to light.  Neck: Neck  supple. No JVD present. No tracheal deviation present. No thyromegaly present.  Cardiovascular: Normal rate, regular rhythm, normal heart sounds and intact distal pulses.   Respiratory: Effort normal and breath sounds normal. No stridor. No respiratory distress. He has no wheezes.  GI: Soft. There is no tenderness. There is no guarding.  Musculoskeletal:       Left hip: He exhibits decreased range of motion, decreased strength, tenderness and bony tenderness. He exhibits no swelling, no deformity and no laceration.  Lymphadenopathy:    He has no cervical adenopathy.  Neurological: He is alert and oriented to person, place, and time.  Skin: Skin is warm and dry.  Psychiatric: He has a normal mood and affect.     Labs:  Estimated body mass index is 22.16 kg/(m^2) as calculated from the following:   Height as of 02/18/12: 6' (1.829 m).   Weight as of 02/18/12: 74.118 kg (163 lb 6.4 oz).   Imaging Review Plain radiographs demonstrate severe degenerative joint disease of the left hip(s). The bone quality appears to be good for age and reported activity level.  Assessment/Plan:  End stage arthritis, left hip(s)  The patient history, physical examination, clinical judgement of the provider and imaging studies are consistent with end stage degenerative joint disease of the left hip(s) and total hip arthroplasty is deemed medically necessary. The treatment options including medical management, injection therapy, arthroscopy and arthroplasty were discussed at length. The risks and benefits of total hip arthroplasty were presented and reviewed. The risks due to aseptic loosening, infection, stiffness, dislocation/subluxation,  thromboembolic complications and other imponderables were discussed.  The patient acknowledged the explanation, agreed to proceed with the plan and consent was signed. Patient is being admitted for inpatient treatment for surgery, pain control, PT, OT, prophylactic antibiotics,  VTE prophylaxis, progressive ambulation and ADL's and discharge planning.The patient is planning to be discharged to skilled nursing facility.     Justin Porter   PAC  11/26/2013, 9:16 PM

## 2013-11-27 NOTE — Anesthesia Preprocedure Evaluation (Addendum)
Anesthesia Evaluation  Patient identified by MRN, date of birth, ID band Patient awake    Reviewed: Allergy & Precautions, H&P , NPO status , Patient's Chart, lab work & pertinent test results  Airway Mallampati: II TM Distance: >3 FB Neck ROM: full    Dental no notable dental hx. (+) Teeth Intact, Dental Advisory Given   Pulmonary neg pulmonary ROS,  Lung cancer. S/p lobectomy breath sounds clear to auscultation  Pulmonary exam normal       Cardiovascular Exercise Tolerance: Good hypertension, negative cardio ROS  Rhythm:regular Rate:Normal     Neuro/Psych History back surgery negative neurological ROS  negative psych ROS   GI/Hepatic negative GI ROS, Neg liver ROS,   Endo/Other  negative endocrine ROS  Renal/GU negative Renal ROS  negative genitourinary   Musculoskeletal   Abdominal   Peds  Hematology negative hematology ROS (+)   Anesthesia Other Findings   Reproductive/Obstetrics negative OB ROS                          Anesthesia Physical Anesthesia Plan  ASA: III  Anesthesia Plan: General   Post-op Pain Management:    Induction: Intravenous  Airway Management Planned: Oral ETT  Additional Equipment:   Intra-op Plan:   Post-operative Plan: Extubation in OR  Informed Consent: I have reviewed the patients History and Physical, chart, labs and discussed the procedure including the risks, benefits and alternatives for the proposed anesthesia with the patient or authorized representative who has indicated his/her understanding and acceptance.   Dental Advisory Given  Plan Discussed with: CRNA and Surgeon  Anesthesia Plan Comments:        Anesthesia Quick Evaluation

## 2013-11-28 ENCOUNTER — Inpatient Hospital Stay (HOSPITAL_COMMUNITY): Payer: Medicare Other

## 2013-11-28 ENCOUNTER — Inpatient Hospital Stay (HOSPITAL_COMMUNITY): Payer: Medicare Other | Admitting: Anesthesiology

## 2013-11-28 ENCOUNTER — Encounter (HOSPITAL_COMMUNITY)
Admission: RE | Disposition: A | Discharge status: Skilled Nursing Facility | Payer: Self-pay | Source: Ambulatory Visit | Attending: Orthopedic Surgery

## 2013-11-28 ENCOUNTER — Inpatient Hospital Stay (HOSPITAL_COMMUNITY)
Admission: RE | Admit: 2013-11-28 | Discharge: 2013-12-01 | DRG: 470 | Disposition: A | Discharge status: Skilled Nursing Facility | Payer: Medicare Other | Source: Ambulatory Visit | Attending: Orthopedic Surgery | Admitting: Orthopedic Surgery

## 2013-11-28 ENCOUNTER — Encounter (HOSPITAL_COMMUNITY): Payer: Medicare Other | Admitting: Anesthesiology

## 2013-11-28 ENCOUNTER — Encounter (HOSPITAL_COMMUNITY): Payer: Self-pay | Admitting: *Deleted

## 2013-11-28 DIAGNOSIS — D62 Acute posthemorrhagic anemia: Secondary | ICD-10-CM | POA: Diagnosis not present

## 2013-11-28 DIAGNOSIS — Z96649 Presence of unspecified artificial hip joint: Secondary | ICD-10-CM | POA: Diagnosis not present

## 2013-11-28 DIAGNOSIS — M84459D Pathological fracture, hip, unspecified, subsequent encounter for fracture with routine healing: Secondary | ICD-10-CM | POA: Diagnosis not present

## 2013-11-28 DIAGNOSIS — M1610 Unilateral primary osteoarthritis, unspecified hip: Secondary | ICD-10-CM | POA: Diagnosis not present

## 2013-11-28 DIAGNOSIS — M25559 Pain in unspecified hip: Secondary | ICD-10-CM | POA: Diagnosis not present

## 2013-11-28 DIAGNOSIS — I10 Essential (primary) hypertension: Secondary | ICD-10-CM | POA: Diagnosis present

## 2013-11-28 DIAGNOSIS — E785 Hyperlipidemia, unspecified: Secondary | ICD-10-CM | POA: Diagnosis not present

## 2013-11-28 DIAGNOSIS — Z85118 Personal history of other malignant neoplasm of bronchus and lung: Secondary | ICD-10-CM

## 2013-11-28 DIAGNOSIS — Z8546 Personal history of malignant neoplasm of prostate: Secondary | ICD-10-CM

## 2013-11-28 DIAGNOSIS — Z885 Allergy status to narcotic agent status: Secondary | ICD-10-CM | POA: Diagnosis not present

## 2013-11-28 DIAGNOSIS — Z91013 Allergy to seafood: Secondary | ICD-10-CM | POA: Diagnosis not present

## 2013-11-28 DIAGNOSIS — M169 Osteoarthritis of hip, unspecified: Principal | ICD-10-CM | POA: Diagnosis present

## 2013-11-28 DIAGNOSIS — H9190 Unspecified hearing loss, unspecified ear: Secondary | ICD-10-CM | POA: Diagnosis present

## 2013-11-28 DIAGNOSIS — D500 Iron deficiency anemia secondary to blood loss (chronic): Secondary | ICD-10-CM | POA: Diagnosis not present

## 2013-11-28 HISTORY — PX: TOTAL HIP ARTHROPLASTY: SHX124

## 2013-11-28 LAB — TYPE AND SCREEN
ABO/RH(D): A POS
Antibody Screen: NEGATIVE

## 2013-11-28 SURGERY — ARTHROPLASTY, HIP, TOTAL, ANTERIOR APPROACH
Anesthesia: General | Site: Hip | Laterality: Left

## 2013-11-28 MED ORDER — METHOCARBAMOL 500 MG PO TABS
500.0000 mg | ORAL_TABLET | Freq: Four times a day (QID) | ORAL | Status: DC | PRN
  Administered 2013-11-30: 500 mg via ORAL
  Filled 2013-11-28: qty 1

## 2013-11-28 MED ORDER — EPHEDRINE SULFATE 50 MG/ML IJ SOLN
INTRAMUSCULAR | Status: DC | PRN
  Administered 2013-11-28 (×2): 5 mg via INTRAVENOUS

## 2013-11-28 MED ORDER — CEFAZOLIN SODIUM-DEXTROSE 2-3 GM-% IV SOLR
2.0000 g | Freq: Four times a day (QID) | INTRAVENOUS | Status: AC
  Administered 2013-11-28 (×2): 2 g via INTRAVENOUS
  Filled 2013-11-28 (×3): qty 50

## 2013-11-28 MED ORDER — LACTATED RINGERS IV SOLN: INTRAVENOUS | Status: DC

## 2013-11-28 MED ORDER — DIPHENHYDRAMINE HCL 25 MG PO CAPS: 25.0000 mg | ORAL_CAPSULE | Freq: Four times a day (QID) | ORAL | Status: DC | PRN

## 2013-11-28 MED ORDER — POTASSIUM CHLORIDE 2 MEQ/ML IV SOLN
100.0000 mL/h | INTRAVENOUS | Status: DC
  Administered 2013-11-28 – 2013-11-29 (×2): 100 mL/h via INTRAVENOUS
  Filled 2013-11-28 (×4): qty 1000

## 2013-11-28 MED ORDER — HYDROMORPHONE HCL PF 1 MG/ML IJ SOLN
INTRAMUSCULAR | Status: AC
  Filled 2013-11-28: qty 1

## 2013-11-28 MED ORDER — CELECOXIB 200 MG PO CAPS
200.0000 mg | ORAL_CAPSULE | Freq: Two times a day (BID) | ORAL | Status: DC
  Administered 2013-11-28 – 2013-12-01 (×6): 200 mg via ORAL
  Filled 2013-11-28 (×8): qty 1

## 2013-11-28 MED ORDER — FENTANYL CITRATE 0.05 MG/ML IJ SOLN
INTRAMUSCULAR | Status: AC
  Filled 2013-11-28: qty 5

## 2013-11-28 MED ORDER — BISACODYL 10 MG RE SUPP
10.0000 mg | Freq: Every day | RECTAL | Status: DC | PRN
Start: 2013-11-28 — End: 2013-12-01

## 2013-11-28 MED ORDER — TRANEXAMIC ACID 100 MG/ML IV SOLN
1000.0000 mg | Freq: Once | INTRAVENOUS | Status: AC
  Administered 2013-11-28: 1000 mg via INTRAVENOUS
  Filled 2013-11-28: qty 10

## 2013-11-28 MED ORDER — CHLORHEXIDINE GLUCONATE 4 % EX LIQD: 60.0000 mL | Freq: Once | CUTANEOUS | Status: DC

## 2013-11-28 MED ORDER — PROPOFOL 10 MG/ML IV BOLUS
INTRAVENOUS | Status: DC | PRN
Start: 2013-11-28 — End: 2013-11-28
  Administered 2013-11-28: 120 mg via INTRAVENOUS

## 2013-11-28 MED ORDER — ALUM & MAG HYDROXIDE-SIMETH 200-200-20 MG/5ML PO SUSP: 30.0000 mL | ORAL | Status: DC | PRN

## 2013-11-28 MED ORDER — PROPOFOL 10 MG/ML IV BOLUS
INTRAVENOUS | Status: AC
  Filled 2013-11-28: qty 20

## 2013-11-28 MED ORDER — FUROSEMIDE 20 MG PO TABS
20.0000 mg | ORAL_TABLET | Freq: Two times a day (BID) | ORAL | Status: DC
  Administered 2013-11-28 – 2013-12-01 (×6): 20 mg via ORAL
  Filled 2013-11-28 (×8): qty 1

## 2013-11-28 MED ORDER — FERROUS SULFATE 325 (65 FE) MG PO TABS
325.0000 mg | ORAL_TABLET | Freq: Three times a day (TID) | ORAL | Status: DC
  Administered 2013-11-28 – 2013-12-01 (×8): 325 mg via ORAL
  Filled 2013-11-28 (×11): qty 1

## 2013-11-28 MED ORDER — METOCLOPRAMIDE HCL 5 MG/ML IJ SOLN
5.0000 mg | Freq: Three times a day (TID) | INTRAMUSCULAR | Status: DC | PRN
  Administered 2013-11-28: 10 mg via INTRAVENOUS
  Filled 2013-11-28: qty 2

## 2013-11-28 MED ORDER — ONDANSETRON HCL 4 MG/2ML IJ SOLN
INTRAMUSCULAR | Status: DC | PRN
  Administered 2013-11-28: 4 mg via INTRAVENOUS

## 2013-11-28 MED ORDER — 0.9 % SODIUM CHLORIDE (POUR BTL) OPTIME
TOPICAL | Status: DC | PRN
  Administered 2013-11-28: 1000 mL

## 2013-11-28 MED ORDER — DEXAMETHASONE SODIUM PHOSPHATE 10 MG/ML IJ SOLN
INTRAMUSCULAR | Status: DC | PRN
  Administered 2013-11-28: 10 mg via INTRAVENOUS

## 2013-11-28 MED ORDER — MENTHOL 3 MG MT LOZG: 1.0000 | LOZENGE | OROMUCOSAL | Status: DC | PRN

## 2013-11-28 MED ORDER — ROCURONIUM BROMIDE 100 MG/10ML IV SOLN
INTRAVENOUS | Status: DC | PRN
Start: 2013-11-28 — End: 2013-11-28
  Administered 2013-11-28: 40 mg via INTRAVENOUS

## 2013-11-28 MED ORDER — ONDANSETRON HCL 4 MG PO TABS
4.0000 mg | ORAL_TABLET | Freq: Four times a day (QID) | ORAL | Status: DC | PRN
Start: 2013-11-28 — End: 2013-12-01

## 2013-11-28 MED ORDER — MAGNESIUM CITRATE PO SOLN: 1.0000 | Freq: Once | ORAL | Status: AC | PRN

## 2013-11-28 MED ORDER — FENTANYL CITRATE 0.05 MG/ML IJ SOLN
INTRAMUSCULAR | Status: DC | PRN
  Administered 2013-11-28 (×3): 50 ug via INTRAVENOUS
  Administered 2013-11-28: 100 ug via INTRAVENOUS
  Administered 2013-11-28: 50 ug via INTRAVENOUS

## 2013-11-28 MED ORDER — CEFAZOLIN SODIUM-DEXTROSE 2-3 GM-% IV SOLR
2.0000 g | INTRAVENOUS | Status: AC
  Administered 2013-11-28: 2 g via INTRAVENOUS

## 2013-11-28 MED ORDER — HYDROCODONE-ACETAMINOPHEN 7.5-325 MG PO TABS
1.0000 | ORAL_TABLET | ORAL | Status: DC
  Administered 2013-11-28 – 2013-12-01 (×14): 1 via ORAL
  Filled 2013-11-28 (×15): qty 1

## 2013-11-28 MED ORDER — ONDANSETRON HCL 4 MG/2ML IJ SOLN
4.0000 mg | Freq: Four times a day (QID) | INTRAMUSCULAR | Status: DC | PRN
Start: 2013-11-28 — End: 2013-12-01
  Administered 2013-11-28: 4 mg via INTRAVENOUS
  Filled 2013-11-28: qty 2

## 2013-11-28 MED ORDER — CEFAZOLIN SODIUM-DEXTROSE 2-3 GM-% IV SOLR
INTRAVENOUS | Status: AC
  Filled 2013-11-28: qty 50

## 2013-11-28 MED ORDER — LACTATED RINGERS IV SOLN
INTRAVENOUS | Status: DC | PRN
  Administered 2013-11-28 (×3): via INTRAVENOUS

## 2013-11-28 MED ORDER — DOCUSATE SODIUM 100 MG PO CAPS
100.0000 mg | ORAL_CAPSULE | Freq: Two times a day (BID) | ORAL | Status: DC
  Administered 2013-11-28 – 2013-12-01 (×6): 100 mg via ORAL

## 2013-11-28 MED ORDER — GLYCOPYRROLATE 0.2 MG/ML IJ SOLN
INTRAMUSCULAR | Status: DC | PRN
  Administered 2013-11-28: .6 mg via INTRAVENOUS

## 2013-11-28 MED ORDER — LIDOCAINE HCL (CARDIAC) 20 MG/ML IV SOLN
INTRAVENOUS | Status: DC | PRN
Start: 2013-11-28 — End: 2013-11-28
  Administered 2013-11-28: 50 mg via INTRAVENOUS

## 2013-11-28 MED ORDER — PHENOL 1.4 % MT LIQD: 1.0000 | OROMUCOSAL | Status: DC | PRN

## 2013-11-28 MED ORDER — HYDROMORPHONE HCL PF 1 MG/ML IJ SOLN: 0.5000 mg | INTRAMUSCULAR | Status: DC | PRN

## 2013-11-28 MED ORDER — DEXAMETHASONE SODIUM PHOSPHATE 10 MG/ML IJ SOLN
10.0000 mg | Freq: Once | INTRAMUSCULAR | Status: AC
  Administered 2013-11-29: 10 mg via INTRAVENOUS
  Filled 2013-11-28: qty 1

## 2013-11-28 MED ORDER — POLYETHYLENE GLYCOL 3350 17 G PO PACK
17.0000 g | PACK | Freq: Two times a day (BID) | ORAL | Status: DC
  Administered 2013-11-29 – 2013-12-01 (×5): 17 g via ORAL

## 2013-11-28 MED ORDER — DEXAMETHASONE SODIUM PHOSPHATE 10 MG/ML IJ SOLN: 10.0000 mg | Freq: Once | INTRAMUSCULAR | Status: DC

## 2013-11-28 MED ORDER — METOCLOPRAMIDE HCL 10 MG PO TABS: 5.0000 mg | ORAL_TABLET | Freq: Three times a day (TID) | ORAL | Status: DC | PRN

## 2013-11-28 MED ORDER — SIMVASTATIN 20 MG PO TABS
20.0000 mg | ORAL_TABLET | Freq: Every evening | ORAL | Status: DC
  Administered 2013-11-28 – 2013-11-30 (×3): 20 mg via ORAL
  Filled 2013-11-28 (×4): qty 1

## 2013-11-28 MED ORDER — DEXTROSE 5 % IV SOLN
500.0000 mg | Freq: Four times a day (QID) | INTRAVENOUS | Status: DC | PRN
  Administered 2013-11-28: 500 mg via INTRAVENOUS
  Filled 2013-11-28: qty 5

## 2013-11-28 MED ORDER — HYDROMORPHONE HCL PF 1 MG/ML IJ SOLN
0.2500 mg | INTRAMUSCULAR | Status: DC | PRN
Start: 2013-11-28 — End: 2013-11-28
  Administered 2013-11-28 (×3): 0.5 mg via INTRAVENOUS

## 2013-11-28 MED ORDER — ASPIRIN EC 325 MG PO TBEC
325.0000 mg | DELAYED_RELEASE_TABLET | Freq: Two times a day (BID) | ORAL | Status: DC
  Administered 2013-11-29 – 2013-12-01 (×5): 325 mg via ORAL
  Filled 2013-11-28 (×9): qty 1

## 2013-11-28 MED ORDER — NEOSTIGMINE METHYLSULFATE 10 MG/10ML IV SOLN
INTRAVENOUS | Status: DC | PRN
  Administered 2013-11-28: 4 mg via INTRAVENOUS

## 2013-11-28 SURGICAL SUPPLY — 39 items
ADH SKN CLS APL DERMABOND .7 (GAUZE/BANDAGES/DRESSINGS) ×1
BAG SPEC THK2 15X12 ZIP CLS (MISCELLANEOUS)
BAG ZIPLOCK 12X15 (MISCELLANEOUS) IMPLANT
CAPT HIP PF MOP ×2 IMPLANT
COVER PERINEAL POST (MISCELLANEOUS) ×3 IMPLANT
DERMABOND ADVANCED (GAUZE/BANDAGES/DRESSINGS) ×2
DERMABOND ADVANCED .7 DNX12 (GAUZE/BANDAGES/DRESSINGS) ×1 IMPLANT
DRAPE C-ARM 42X120 X-RAY (DRAPES) ×3 IMPLANT
DRAPE STERI IOBAN 125X83 (DRAPES) ×3 IMPLANT
DRAPE U-SHAPE 47X51 STRL (DRAPES) ×9 IMPLANT
DRSG AQUACEL AG ADV 3.5X10 (GAUZE/BANDAGES/DRESSINGS) ×3 IMPLANT
DURAPREP 26ML APPLICATOR (WOUND CARE) ×3 IMPLANT
ELECT BLADE TIP CTD 4 INCH (ELECTRODE) ×3 IMPLANT
FACESHIELD WRAPAROUND (MASK) ×9 IMPLANT
FACESHIELD WRAPAROUND OR TEAM (MASK) ×4 IMPLANT
GLOVE BIO SURGEON STRL SZ8 (GLOVE) ×2 IMPLANT
GLOVE BIOGEL PI IND STRL 7.5 (GLOVE) ×1 IMPLANT
GLOVE BIOGEL PI IND STRL 8 (GLOVE) ×1 IMPLANT
GLOVE BIOGEL PI IND STRL 8.5 (GLOVE) IMPLANT
GLOVE BIOGEL PI INDICATOR 7.5 (GLOVE) ×2
GLOVE BIOGEL PI INDICATOR 8 (GLOVE) ×2
GLOVE BIOGEL PI INDICATOR 8.5 (GLOVE) ×2
GLOVE ECLIPSE 8.0 STRL XLNG CF (GLOVE) ×5 IMPLANT
GLOVE ORTHO TXT STRL SZ7.5 (GLOVE) ×6 IMPLANT
GOWN SPEC L3 XXLG W/TWL (GOWN DISPOSABLE) ×5 IMPLANT
GOWN STRL REUS W/TWL LRG LVL3 (GOWN DISPOSABLE) ×3 IMPLANT
GOWN STRL REUS W/TWL XL LVL3 (GOWN DISPOSABLE) ×2 IMPLANT
KIT BASIN OR (CUSTOM PROCEDURE TRAY) ×3 IMPLANT
PACK TOTAL JOINT (CUSTOM PROCEDURE TRAY) ×3 IMPLANT
SAW OSC TIP CART 19.5X105X1.3 (SAW) ×3 IMPLANT
SUT MNCRL AB 4-0 PS2 18 (SUTURE) ×3 IMPLANT
SUT VIC AB 1 CT1 36 (SUTURE) ×9 IMPLANT
SUT VIC AB 2-0 CT1 27 (SUTURE) ×6
SUT VIC AB 2-0 CT1 TAPERPNT 27 (SUTURE) ×2 IMPLANT
SUT VLOC 180 0 24IN GS25 (SUTURE) ×3 IMPLANT
TOWEL OR 17X26 10 PK STRL BLUE (TOWEL DISPOSABLE) ×3 IMPLANT
TOWEL OR NON WOVEN STRL DISP B (DISPOSABLE) ×2 IMPLANT
TRAY FOLEY CATH 16FRSI W/METER (SET/KITS/TRAYS/PACK) ×2 IMPLANT
WATER STERILE IRR 1500ML POUR (IV SOLUTION) ×2 IMPLANT

## 2013-11-28 NOTE — Interval H&P Note (Signed)
History and Physical Interval Note:  11/28/2013 7:36 AM  Justin Porter  has presented today for surgery, with the diagnosis of LEFT HIP OA  The various methods of treatment have been discussed with the patient and family. After consideration of risks, benefits and other options for treatment, the patient has consented to  Procedure(s): LEFT TOTAL HIP ARTHROPLASTY ANTERIOR APPROACH (Left) as a surgical intervention .  The patient's history has been reviewed, patient examined, no change in status, stable for surgery.  I have reviewed the patient's chart and labs.  Questions were answered to the patient's satisfaction.     [name]

## 2013-11-28 NOTE — Op Note (Signed)
NAME:  Justin Porter                ACCOUNT NO.: [id number]      MEDICAL RECORD NO.: [id number]      FACILITY:  [facility]      PHYSICIAN:  [name]  DATE OF BIRTH:  Nov 29, 1928     DATE OF PROCEDURE:  11/28/2013                                 OPERATIVE REPORT         PREOPERATIVE DIAGNOSIS: Left  hip osteoarthritis.      POSTOPERATIVE DIAGNOSIS:  Left hip osteoarthritis.      PROCEDURE:  Left total hip replacement through an anterior approach   utilizing DePuy THR system, component size 95mm pinnacle cup, a size 36+4 neutral   Altrex liner, a size 6 Hi Tri Lock stem with a 36+5 Articuleze metal ball.      SURGEON:  Pietro Cassis. Alvan Dame, M.D.      ASSISTANT:  Danae Orleans, PA-C      ANESTHESIA:  Regional.      SPECIMENS:  None.      COMPLICATIONS:  None.      BLOOD LOSS:  500 cc     DRAINS:  None.      INDICATION OF THE PROCEDURE:  Justin Porter is a 80 y.o. male who had   presented to office for evaluation of left hip pain.  Radiographs revealed   progressive degenerative changes with bone-on-bone   articulation to the  hip joint.  The patient had painful limited range of   motion significantly affecting their overall quality of life.  The patient was failing to    respond to conservative measures, and at this point was ready   to proceed with more definitive measures.  The patient has noted progressive   degenerative changes in his hip, progressive problems and dysfunction   with regarding the hip prior to surgery.  Consent was obtained for   benefit of pain relief.  Specific risk of infection, DVT, component   failure, dislocation, need for revision surgery, as well discussion of   the anterior versus posterior approach were reviewed.  Consent was   obtained for benefit of anterior pain relief through an anterior   approach.      PROCEDURE IN DETAIL:  The patient was brought to operative theater.   Once adequate anesthesia,  preoperative antibiotics, 2gm Ancef administered.   The patient was positioned supine on the OSI Hanna table.  Once adequate   padding of boney process was carried out, we had predraped out the hip, and  used fluoroscopy to confirm orientation of the pelvis and position.      The left hip was then prepped and draped from proximal iliac crest to   mid thigh with shower curtain technique.      Time-out was performed identifying the patient, planned procedure, and   extremity.     An incision was then made 2 cm distal and lateral to the   anterior superior iliac spine extending over the orientation of the   tensor fascia lata muscle and sharp dissection was carried down to the   fascia of the muscle and protractor placed in the soft tissues.      The fascia was then incised.  The muscle belly was identified and swept  laterally and retractor placed along the superior neck.  Following   cauterization of the circumflex vessels and removing some pericapsular   fat, a second cobra retractor was placed on the inferior neck.  A third   retractor was placed on the anterior acetabulum after elevating the   anterior rectus.  A L-capsulotomy was along the line of the   superior neck to the trochanteric fossa, then extended proximally and   distally.  Tag sutures were placed and the retractors were then placed   intracapsular.  We then identified the trochanteric fossa and   orientation of my neck cut, confirmed this radiographically   and then made a neck osteotomy with the femur on traction.  The femoral   head was removed without difficulty or complication.  Traction was let   off and retractors were placed posterior and anterior around the   acetabulum.      The labrum and foveal tissue were debrided.  I began reaming with a 10mm   reamer and reamed up to 75mm reamer with good bony bed preparation and a 54   cup was chosen.  The final 63mm Pinnacle cup was then impacted under fluoroscopy  to  confirm the depth of penetration and orientation with respect to   abduction.  A screw was placed followed by the hole eliminator.  The final   36+4 neutral Altrex liner was impacted with good visualized rim fit.  The cup was positioned anatomically within the acetabular portion of the pelvis.      At this point, the femur was rolled at 80 degrees.  Further capsule was   released off the inferior aspect of the femoral neck.  I then   released the superior capsule proximally.  The hook was placed laterally   along the femur and elevated manually and held in position with the bed   hook.  The leg was then extended and adducted with the leg rolled to 100   degrees of external rotation.  Once the proximal femur was fully   exposed, I used a box osteotome to set orientation.  I then began   broaching with the starting chili pepper broach and passed this by hand and then broached up to size 6 broach.  With the 6 broach in place I chose a high offset neck and did a trial reduction first with the +1.5 then the +5 head ball trials.  The offset was appropriate, leg lengths   appeared to be equal, confirmed radiographically.   Given these findings, I went ahead and dislocated the hip, repositioned all   retractors and positioned the right hip in the extended and abducted position.  The final 6 Hi Tri Lock stem was   chosen and it was impacted down to the level of neck cut.  Based on this   and the trial reduction, a 36+5 Articuleze metal head ball was chosen and   impacted onto a clean and dry trunnion, and the hip was reduced.  The   hip had been irrigated throughout the case again at this point.  I did   reapproximate the superior capsular leaflet to the anterior leaflet   using #1 Vicryl.  The fascia of the   tensor fascia lata muscle was then reapproximated using #1 Vicryl and #0 V-lock sutures.  The   remaining wound was closed with 2-0 Vicryl and running 4-0 Monocryl.   The hip was cleaned, dried,  and dressed sterilely using Dermabond and  Aquacel dressing.  She was then brought   to recovery room in stable condition tolerating the procedure well.    Matt Babish, PA-C was present for the entirety of the case involved from   preoperative positioning, perioperative retractor management, general   facilitation of the case, as well as primary wound closure as assistant.            Pietro Cassis Alvan Dame, M.D.        11/28/2013 9:57 AM

## 2013-11-28 NOTE — Anesthesia Postprocedure Evaluation (Signed)
  Anesthesia Post-op Note  Patient: Justin Porter  Procedure(s) Performed: Procedure(s) (LRB): LEFT TOTAL HIP ARTHROPLASTY ANTERIOR APPROACH (Left)  Patient Location: PACU  Anesthesia Type: General  Level of Consciousness: awake and alert   Airway and Oxygen Therapy: Patient Spontanous Breathing  Post-op Pain: mild  Post-op Assessment: Post-op Vital signs reviewed, Patient's Cardiovascular Status Stable, Respiratory Function Stable, Patent Airway and No signs of Nausea or vomiting  Last Vitals:  Filed Vitals:   11/28/13 1242  BP: 175/86  Pulse: 93  Temp: 36.4 C  Resp: 16    Post-op Vital Signs: stable   Complications: No apparent anesthesia complications

## 2013-11-28 NOTE — Transfer of Care (Signed)
Immediate Anesthesia Transfer of Care Note  Patient: Justin Porter  Procedure(s) Performed: Procedure(s) (LRB): LEFT TOTAL HIP ARTHROPLASTY ANTERIOR APPROACH (Left)  Patient Location: PACU  Anesthesia Type: General  Level of Consciousness: sedated, patient cooperative and responds to stimulation  Airway & Oxygen Therapy: Patient Spontanous Breathing and Patient connected to face mask oxgen  Post-op Assessment: Report given to PACU RN and Post -op Vital signs reviewed and stable  Post vital signs: Reviewed and stable  Complications: No apparent anesthesia complications

## 2013-11-28 NOTE — Progress Notes (Signed)
PT Cancellation Note  Patient Details Name: Justin Porter MRN: [id number] DOB: [birth year]   Cancelled Treatment:    Reason Eval/Treat Not Completed: Patient not medically ready (Per RN pt lethargic and nauseous. Not ready to get up. Will see tomorrow. )   Lucile Crater 11/28/2013, 3:39 PM [phone]

## 2013-11-28 NOTE — Plan of Care (Signed)
Problem: Consults Goal: Diagnosis- Total Joint Replacement Primary Total Hip     

## 2013-11-29 DIAGNOSIS — D500 Iron deficiency anemia secondary to blood loss (chronic): Secondary | ICD-10-CM | POA: Diagnosis not present

## 2013-11-29 LAB — CBC
HEMATOCRIT: 31.5 % — AB (ref 39.0–52.0)
HEMOGLOBIN: 11 g/dL — AB (ref 13.0–17.0)
MCH: 31.8 pg (ref 26.0–34.0)
MCHC: 34.9 g/dL (ref 30.0–36.0)
MCV: 91 fL (ref 78.0–100.0)
Platelets: 187 10*3/uL (ref 150–400)
RBC: 3.46 MIL/uL — ABNORMAL LOW (ref 4.22–5.81)
RDW: 12.5 % (ref 11.5–15.5)
WBC: 10.6 10*3/uL — AB (ref 4.0–10.5)

## 2013-11-29 LAB — BASIC METABOLIC PANEL
BUN: 9 mg/dL (ref 6–23)
CHLORIDE: 100 meq/L (ref 96–112)
CO2: 27 mEq/L (ref 19–32)
Calcium: 8.8 mg/dL (ref 8.4–10.5)
Creatinine, Ser: 0.7 mg/dL (ref 0.50–1.35)
GFR calc non Af Amer: 85 mL/min — ABNORMAL LOW (ref >90)
GLUCOSE: 116 mg/dL — AB (ref 70–99)
POTASSIUM: 4.8 meq/L (ref 3.7–5.3)
SODIUM: 135 meq/L — AB (ref 137–147)

## 2013-11-29 NOTE — Progress Notes (Signed)
Clinical Social Work Department CLINICAL SOCIAL WORK PLACEMENT NOTE 11/29/2013  Patient:  Justin Porter, Justin Porter  Account Number:  [id number] Admit date:  11/28/2013  Clinical Social Worker:  Werner Lean, LCSW  Date/time:  11/29/2013 12:06 PM  Clinical Social Work is seeking post-discharge placement for this patient at the following level of care:   SKILLED NURSING   (*CSW will update this form in Epic as items are completed)     Patient/family provided with [place] Department of Clinical Social Work's list of facilities offering this level of care within the geographic area requested by the patient (or if unable, by the patient's family).  11/29/2013  Patient/family informed of their freedom to choose among providers that offer the needed level of care, that participate in Medicare, Medicaid or managed care program needed by the patient, have an available bed and are willing to accept the patient.    Patient/family informed of [facility]' ownership interest in [facility], as well as of the fact that they are under no obligation to receive care at this facility.  PASARR submitted to EDS on 11/28/2013 PASARR number received from [employer] on 11/28/2013  FL2 transmitted to all facilities in geographic area requested by pt/family on  11/29/2013 FL2 transmitted to all facilities within larger geographic area on   Patient informed that his/her managed care company has contracts with or will negotiate with  certain facilities, including the following:     Patient/family informed of bed offers received:  11/29/2013 Patient chooses bed at [facility] Physician recommends and patient chooses bed at    Patient to be transferred to  on   Patient to be transferred to facility by   The following physician request were entered in Epic:   Additional Comments:  Werner Lean LCSW [phone]

## 2013-11-29 NOTE — Evaluation (Signed)
Physical Therapy Evaluation Patient Details Name: Justin Porter MRN: [id number] DOB: 02/04/1929 Today's Date: 11/29/2013   History of Present Illness     Clinical Impression  Pt s/p L THR presents with decreased L LE strength/ROM and post op pain limiting functional mobility.  Pt would benefit from follow up rehab at SNF level to maximize IND and safety prior to return home    Follow Up Recommendations SNF    Equipment Recommendations  None recommended by PT    Recommendations for Other Services OT consult     Precautions / Restrictions Precautions Precautions: Fall Restrictions Weight Bearing Restrictions: No Other Position/Activity Restrictions: WBAT      Mobility  Bed Mobility Overal bed mobility: +2 for physical assistance;Needs Assistance Bed Mobility: Supine to Sit     Supine to sit: +2 for physical assistance;Mod assist     General bed mobility comments: cues for sequence and use of R LE to self assist  Transfers Overall transfer level: Needs assistance Equipment used: Rolling walker (2 wheeled) Transfers: Sit to/from Stand Sit to Stand: Mod assist         General transfer comment: Cues for LE management and use of UEs to self assist  Ambulation/Gait Ambulation/Gait assistance: Min assist;Mod assist;+2 safety/equipment Ambulation Distance (Feet): 45 Feet Assistive device: Rolling walker (2 wheeled) Gait Pattern/deviations: Step-to pattern;Decreased step length - right;Decreased step length - left;Shuffle;Trunk flexed     General Gait Details: cues for sequence, posture and position from [employer]            Wheelchair Mobility    Modified Rankin (Stroke Patients Only)       Balance                                             Pertinent Vitals/Pain 4/10; premed ice packs provided    Home Living Family/patient expects to be discharged to:: Skilled nursing facility Living Arrangements: Spouse/significant  other                    Prior Function Level of Independence: Needs assistance               Hand Dominance   Dominant Hand: Right    Extremity/Trunk Assessment   Upper Extremity Assessment: RUE deficits/detail;LUE deficits/detail RUE Deficits / Details:  (Ltd Bil elbow ext)         Lower Extremity Assessment: RLE deficits/detail;LLE deficits/detail RLE Deficits / Details: hip flex ltd to ~80 degrees LLE Deficits / Details: Hip strength 2+/5 with AAROM at hip to 80 flex and 15 abd  Cervical / Trunk Assessment: Kyphotic  Communication   Communication: HOH  Cognition Arousal/Alertness: Awake/alert Behavior During Therapy: WFL for tasks assessed/performed Overall Cognitive Status: Within Functional Limits for tasks assessed                      General Comments      Exercises Total Joint Exercises Ankle Circles/Pumps: AROM;Both;15 reps;Supine Quad Sets: AROM;Both;10 reps;Supine Heel Slides: AAROM;Left;15 reps;Supine Hip ABduction/ADduction: AAROM;Left;10 reps;Supine      Assessment/Plan    PT Assessment Patient needs continued PT services  PT Diagnosis Difficulty walking   PT Problem List Decreased strength;Decreased range of motion;Decreased activity tolerance;Decreased mobility;Decreased knowledge of use of DME;Pain;Decreased safety awareness  PT Treatment Interventions DME instruction;Gait training;Stair training;Functional mobility training;Therapeutic activities;Therapeutic exercise;Patient/family education  PT Goals (Current goals can be found in the Care Plan section) Acute Rehab PT Goals Patient Stated Goal: [place] by Sept PT Goal Formulation: With patient Time For Goal Achievement: 12/06/13 Potential to Achieve Goals: Good    Frequency 7X/week   Barriers to discharge        Co-evaluation               End of Session Equipment Utilized During Treatment: Gait belt Activity Tolerance: Patient tolerated treatment  well Patient left: in chair;with call bell/phone within reach;with family/visitor present Nurse Communication: Mobility status         Time: 0930-1003 PT Time Calculation (min): 33 min   Charges:   PT Evaluation $Initial PT Evaluation Tier I: 1 Procedure PT Treatments $Gait Training: 8-22 mins $Therapeutic Exercise: 8-22 mins   PT G Codes:          Mathis Fare 11/29/2013, 12:20 PM

## 2013-11-29 NOTE — Progress Notes (Signed)
Clinical Social Work Department BRIEF PSYCHOSOCIAL ASSESSMENT 11/29/2013  Patient:  THEADOR, JEZEWSKI     Account Number:  [id number]     Admit date:  11/28/2013  Clinical Social Worker:  Lacie Scotts  Date/Time:  11/29/2013 11:59 AM  Referred by:  Physician  Date Referred:  11/29/2013 Referred for  SNF Placement   Other Referral:   Interview type:  Patient Other interview type:    PSYCHOSOCIAL DATA Living Status:  WIFE Admitted from facility:   Level of care:   Primary support name:  Ann Primary support relationship to patient:  SPOUSE Degree of support available:   supportive    CURRENT CONCERNS Current Concerns  Post-Acute Placement   Other Concerns:    SOCIAL WORK ASSESSMENT / PLAN Pt is an 80 yr old gentleman living at home prior to hospitalization. CSW met with pt / spouse to assist with d/c planning needs. This is a planned admission. Pt has made prior arrangements to have ST Rehab at [facility] in [place] following hospital d/c. CSW has contacted SNF and d/c plans have been confirmed. CSW will continue to follow to assist with d/c planning to SNF.   Assessment/plan status:  Psychosocial Support/Ongoing Assessment of Needs Other assessment/ plan:   Information/referral to community resources:   Insurance coverage for SNF and ambulance transport reviewed.    PATIENT'S/FAMILY'S RESPONSE TO PLAN OF CARE: Pt had a fairly good night following surgery. Pain is being controlled. Pt is looking forward to having rehab at [facility].    Werner Lean LCSW [phone]

## 2013-11-29 NOTE — Progress Notes (Signed)
Physical Therapy Treatment Patient Details Name: Justin Porter MRN: [id number] DOB: 05-02-1929 Today's Date: 11/29/2013    History of Present Illness      PT Comments    POD # 1 pm session.  Assisted out of recliner to amb limited distance in hallway twice with one sitting rest break. Performed some THR TE's in recliner then applied ICE.   Follow Up Recommendations  SNF     Equipment Recommendations       Recommendations for Other Services       Precautions / Restrictions Precautions Precautions: Fall Restrictions Weight Bearing Restrictions: No Other Position/Activity Restrictions: WBAT    Mobility  Bed Mobility               General bed mobility comments: Pt OOB in recliner  Transfers Overall transfer level: Needs assistance Equipment used: Rolling walker (2 wheeled) Transfers: Sit to/from Stand Sit to Stand: Mod assist         General transfer comment: Cues for LE management and use of UEs to self assist plus increased time  Ambulation/Gait Ambulation/Gait assistance: Min assist;Mod assist Ambulation Distance (Feet): 53 Feet Assistive device: Rolling walker (2 wheeled) Gait Pattern/deviations: Step-to pattern;Step-through pattern;Trunk flexed Gait velocity: decreased   General Gait Details: cues for sequence, posture and position from [employer]            Wheelchair Mobility    Modified Rankin (Stroke Patients Only)       Balance                                    Cognition                            Exercises  10 reps B LE AP 10 reps B LE knee presses 10 reps L LE AAROM HS 10 reps L LE AAROM ABD/ADD    General Comments        Pertinent Vitals/Pain C/o 3/10 pain "tightness"    Home Living                      Prior Function            PT Goals (current goals can now be found in the care plan section) Progress towards PT goals: Progressing toward goals    Frequency  7X/week    PT Plan      Co-evaluation             End of Session Equipment Utilized During Treatment: Gait belt Activity Tolerance: Patient tolerated treatment well Patient left: in chair;with call bell/phone within reach;with family/visitor present     Time: 1415-1440 PT Time Calculation (min): 25 min  Charges:  $Gait Training: 8-22 mins $Therapeutic Exercise: 8-22 mins                    G Codes:      Rica Koyanagi  PTA [facility]  Acute  Rehab Pager      [phone]

## 2013-11-29 NOTE — Progress Notes (Signed)
OT Cancellation Note  Patient Details Name: Justin Porter MRN: [id number] DOB: 1929-04-28   Cancelled Treatment:    Reason Eval/Treat Not Completed: Other (comment)  Pt is Medicare/Medicaid and current D/C plan is SNF. No apparent immediate acute care OT needs, therefore will defer OT to SNF. If OT eval is needed please call Acute Rehab Dept. at [facility] 11/29/2013, 10:09 AM Lesle Chris, OTR/L [phone] 11/29/2013

## 2013-11-29 NOTE — Care Management Note (Addendum)
    Page 1 of 1   11/29/2013     11:36:08 AM CARE MANAGEMENT NOTE 11/29/2013  Patient:  Justin Porter, Justin Porter   Account Number:  [id number]  Date Initiated:  11/29/2013  Documentation initiated by:  [facility]  Subjective/Objective Assessment:   adm:L TOTAL HIP ARTHROPLASTY ANTERIOR APPROACH     Action/Plan:   snf for rehab ([facility])   Anticipated DC Date:  11/30/2013   Anticipated DC Plan:  [place]  CM consult      Choice offered to / List presented to:             Status of service:  Completed, signed off Medicare Important Message given?  YES (If response is "NO", the following Medicare IM given date fields will be blank) Date Medicare IM given:  11/14/2013 Date Additional Medicare IM given:    Discharge Disposition:  [place]  Per UR Regulation:    If discussed at Long Length of Stay Meetings, dates discussed:    Comments:  11/29/13 11:31 CM reviewed; CSW arranging SNF for rehab.  No other CM needs communicated. IM from medicare signed pre-operatively.  Mariane Masters, BSN, CM [phone].

## 2013-11-29 NOTE — Plan of Care (Signed)
Problem: Consults Goal: Diagnosis- Total Joint Replacement Outcome: Completed/Met Date Met:  11/29/13 Primary Total Hip LEFT, Anterior

## 2013-11-29 NOTE — Progress Notes (Signed)
Utilization review completed.  

## 2013-11-29 NOTE — Progress Notes (Signed)
   Subjective: 1 Day Post-Op Procedure(s) (LRB): LEFT TOTAL HIP ARTHROPLASTY ANTERIOR APPROACH (Left)   Patient reports pain as mild, pain controlled. No events throughout the night.   Objective:   VITALS:   Filed Vitals:   11/29/13 0625  BP: 157/79  Pulse: 77  Temp: 97.9 F (36.6 C)  Resp: 16    Neurovascular intact Dorsiflexion/Plantar flexion intact Incision: dressing C/D/I No cellulitis present Compartment soft  LABS  Recent Labs  11/29/13 0420  HGB 11.0*  HCT 31.5*  WBC 10.6*  PLT 187     Recent Labs  11/29/13 0420  NA 135*  K 4.8  BUN 9  CREATININE 0.70  GLUCOSE 116*     Assessment/Plan: 1 Day Post-Op Procedure(s) (LRB): LEFT TOTAL HIP ARTHROPLASTY ANTERIOR APPROACH (Left) Foley cath d/c'ed Advance diet Up with therapy D/C IV fluids Discharge to SNF ([facility]) eventually, when ready  Expected ABLA  Treated with iron and will observe      West Pugh. Felicidad Sugarman   PAC  11/29/2013, 9:11 AM

## 2013-11-30 LAB — BASIC METABOLIC PANEL
BUN: 18 mg/dL (ref 6–23)
CALCIUM: 9 mg/dL (ref 8.4–10.5)
CO2: 28 mEq/L (ref 19–32)
Chloride: 95 mEq/L — ABNORMAL LOW (ref 96–112)
Creatinine, Ser: 0.8 mg/dL (ref 0.50–1.35)
GFR calc non Af Amer: 80 mL/min — ABNORMAL LOW (ref >90)
Glucose, Bld: 103 mg/dL — ABNORMAL HIGH (ref 70–99)
POTASSIUM: 4.4 meq/L (ref 3.7–5.3)
SODIUM: 135 meq/L — AB (ref 137–147)

## 2013-11-30 LAB — CBC
HCT: 31.4 % — ABNORMAL LOW (ref 39.0–52.0)
Hemoglobin: 10.9 g/dL — ABNORMAL LOW (ref 13.0–17.0)
MCH: 31.6 pg (ref 26.0–34.0)
MCHC: 34.7 g/dL (ref 30.0–36.0)
MCV: 91 fL (ref 78.0–100.0)
Platelets: 204 10*3/uL (ref 150–400)
RBC: 3.45 MIL/uL — ABNORMAL LOW (ref 4.22–5.81)
RDW: 12.7 % (ref 11.5–15.5)
WBC: 12.2 10*3/uL — ABNORMAL HIGH (ref 4.0–10.5)

## 2013-11-30 NOTE — Progress Notes (Signed)
Physical Therapy Treatment Patient Details Name: DREWEY BEGUE MRN: [id number] DOB: 1928-09-07 Today's Date: 11/30/2013    History of Present Illness      PT Comments    POD # 2 am session.  Amb limited distance in hallway then perform THR TE's followed by ICE.    Follow Up Recommendations  SNF ([facility])     Equipment Recommendations       Recommendations for Other Services       Precautions / Restrictions Precautions Precautions: Fall Restrictions Weight Bearing Restrictions: No Other Position/Activity Restrictions: WBAT    Mobility  Bed Mobility               General bed mobility comments: Pt OOB in recliner  Transfers Overall transfer level: Needs assistance Equipment used: Rolling walker (2 wheeled) Transfers: Sit to/from Stand Sit to Stand: Min guard         General transfer comment: Cues for LE management and use of UEs to self assist plus increased time  Ambulation/Gait Ambulation/Gait assistance: Min assist;Min guard Ambulation Distance (Feet): 48 Feet Assistive device: Rolling walker (2 wheeled) Gait Pattern/deviations: Step-to pattern;Step-through pattern;Trunk flexed;Decreased stance time - left Gait velocity: decreased   General Gait Details: cues for sequence, posture and position from RW plus increased time   Stairs            Wheelchair Mobility    Modified Rankin (Stroke Patients Only)       Balance                                    Cognition                            Exercises   Total Hip Replacement TE's 10 reps ankle pumps 10 reps knee presses 10 reps heel slides 10 reps SAQ's 10 reps ABD Followed by ICE    General Comments        Pertinent Vitals/Pain C/o "more pain than yesterday" 6/10 Pre medicated ICE applied    Home Living                      Prior Function            PT Goals (current goals can now be found in the care plan section)  Progress towards PT goals: Progressing toward goals    Frequency  7X/week    PT Plan      Co-evaluation             End of Session Equipment Utilized During Treatment: Gait belt Activity Tolerance: Patient tolerated treatment well Patient left: in chair;with call bell/phone within reach;with family/visitor present     Time: 0086-7619 PT Time Calculation (min): 14 min  Charges:  $Therapeutic Exercise: 8-22 mins                    G Codes:      Rica Koyanagi  PTA [facility]  Acute  Rehab Pager      [phone]

## 2013-11-30 NOTE — Progress Notes (Signed)
Physical Therapy Treatment Patient Details Name: Justin Porter MRN: [id number] DOB: 1929/04/17 Today's Date: 11/30/2013    History of Present Illness      PT Comments    POD # 2 pm session.  Assisted pt out of recliner to amb in hallway.  Pt required increased time.  Assisted to recliner and positioned to comfort with ICE as well.    Follow Up Recommendations  SNF ([facility])     Equipment Recommendations       Recommendations for Other Services       Precautions / Restrictions Precautions Precautions: Fall Restrictions Weight Bearing Restrictions: No Other Position/Activity Restrictions: WBAT    Mobility  Bed Mobility               General bed mobility comments: Pt OOB in recliner  Transfers Overall transfer level: Needs assistance Equipment used: Rolling walker (2 wheeled) Transfers: Sit to/from Stand Sit to Stand: Min guard         General transfer comment: Cues for LE management and use of UEs to self assist plus increased time  Ambulation/Gait Ambulation/Gait assistance: Min assist;Min guard Ambulation Distance (Feet): 48 Feet Assistive device: Rolling walker (2 wheeled) Gait Pattern/deviations: Step-to pattern;Step-through pattern;Trunk flexed;Decreased stance time - left Gait velocity: decreased   General Gait Details: cues for sequence, posture and position from RW plus increased time   Stairs            Wheelchair Mobility    Modified Rankin (Stroke Patients Only)       Balance                                    Cognition                            Exercises      General Comments        Pertinent Vitals/Pain C/o "soreness"   Pre med ICE    Home Living                      Prior Function            PT Goals (current goals can now be found in the care plan section) Progress towards PT goals: Progressing toward goals    Frequency  7X/week    PT Plan       Co-evaluation             End of Session Equipment Utilized During Treatment: Gait belt Activity Tolerance: Patient tolerated treatment well Patient left: in chair;with call bell/phone within reach;with family/visitor present     Time: 1308-6578 PT Time Calculation (min): 25 min  Charges:  $Gait Training: 8-22 mins $Therapeutic Activity: 8-22 mins                    G Codes:

## 2013-11-30 NOTE — Plan of Care (Signed)
Problem: Phase III Progression Outcomes Goal: Anticoagulant follow-up in place Outcome: Not Applicable Date Met:  49/35/52 ASA VTE, no f/u needed.

## 2013-11-30 NOTE — Progress Notes (Signed)
   Subjective: 2 Days Post-Op Procedure(s) (LRB): LEFT TOTAL HIP ARTHROPLASTY ANTERIOR APPROACH (Left)   Seen by Dr. Alvan Dame. Patient reports pain as mild, pain controlled. No events throughout the night.   Objective:   VITALS:   Filed Vitals:   11/30/13 0519  BP: 159/87  Pulse: 101  Temp: 97.7 F (36.5 C)  Resp: 18    Neurovascular intact Dorsiflexion/Plantar flexion intact Incision: dressing C/D/I No cellulitis present Compartment soft  LABS  Recent Labs  11/29/13 0420 11/30/13 0420  HGB 11.0* 10.9*  HCT 31.5* 31.4*  WBC 10.6* 12.2*  PLT 187 204     Recent Labs  11/29/13 0420 11/30/13 0420  NA 135* 135*  K 4.8 4.4  BUN 9 18  CREATININE 0.70 0.80  GLUCOSE 116* 103*     Assessment/Plan: 2 Days Post-Op Procedure(s) (LRB): LEFT TOTAL HIP ARTHROPLASTY ANTERIOR APPROACH (Left) Up with therapy Discharge to SNF eventually, when ready  Expected ABLA  Treated with iron and will observe       West Pugh. Anjolina Byrer   PAC  11/30/2013, 7:24 AM

## 2013-12-01 DIAGNOSIS — I10 Essential (primary) hypertension: Secondary | ICD-10-CM | POA: Diagnosis not present

## 2013-12-01 DIAGNOSIS — D638 Anemia in other chronic diseases classified elsewhere: Secondary | ICD-10-CM | POA: Diagnosis not present

## 2013-12-01 DIAGNOSIS — Z8546 Personal history of malignant neoplasm of prostate: Secondary | ICD-10-CM | POA: Diagnosis not present

## 2013-12-01 DIAGNOSIS — G8918 Other acute postprocedural pain: Secondary | ICD-10-CM | POA: Diagnosis not present

## 2013-12-01 DIAGNOSIS — H9190 Unspecified hearing loss, unspecified ear: Secondary | ICD-10-CM | POA: Diagnosis not present

## 2013-12-01 DIAGNOSIS — N039 Chronic nephritic syndrome with unspecified morphologic changes: Secondary | ICD-10-CM | POA: Diagnosis not present

## 2013-12-01 DIAGNOSIS — M84459D Pathological fracture, hip, unspecified, subsequent encounter for fracture with routine healing: Secondary | ICD-10-CM | POA: Diagnosis not present

## 2013-12-01 DIAGNOSIS — M1610 Unilateral primary osteoarthritis, unspecified hip: Secondary | ICD-10-CM | POA: Diagnosis not present

## 2013-12-01 DIAGNOSIS — Z96649 Presence of unspecified artificial hip joint: Secondary | ICD-10-CM | POA: Diagnosis not present

## 2013-12-01 DIAGNOSIS — E785 Hyperlipidemia, unspecified: Secondary | ICD-10-CM | POA: Diagnosis not present

## 2013-12-01 DIAGNOSIS — Z85118 Personal history of other malignant neoplasm of bronchus and lung: Secondary | ICD-10-CM | POA: Diagnosis not present

## 2013-12-01 DIAGNOSIS — I130 Hypertensive heart and chronic kidney disease with heart failure and stage 1 through stage 4 chronic kidney disease, or unspecified chronic kidney disease: Secondary | ICD-10-CM | POA: Diagnosis not present

## 2013-12-01 MED ORDER — DSS 100 MG PO CAPS: 100.0000 mg | ORAL_CAPSULE | Freq: Two times a day (BID) | ORAL | Status: DC

## 2013-12-01 MED ORDER — ASPIRIN 325 MG PO TBEC: 325.0000 mg | DELAYED_RELEASE_TABLET | Freq: Two times a day (BID) | ORAL | Status: AC

## 2013-12-01 MED ORDER — HYDROCODONE-ACETAMINOPHEN 7.5-325 MG PO TABS: 1.0000 | ORAL_TABLET | ORAL | Status: DC | PRN

## 2013-12-01 MED ORDER — FERROUS SULFATE 325 (65 FE) MG PO TABS: 325.0000 mg | ORAL_TABLET | Freq: Three times a day (TID) | ORAL | Status: DC

## 2013-12-01 MED ORDER — POLYETHYLENE GLYCOL 3350 17 G PO PACK: 17.0000 g | PACK | Freq: Two times a day (BID) | ORAL | Status: DC

## 2013-12-01 MED ORDER — TIZANIDINE HCL 4 MG PO CAPS: 4.0000 mg | ORAL_CAPSULE | Freq: Three times a day (TID) | ORAL | Status: DC | PRN

## 2013-12-01 NOTE — Progress Notes (Signed)
Physical Therapy Treatment Patient Details Name: Justin Porter MRN: [id number] DOB: [birth year] Today's Date: 12/01/2013    History of Present Illness 79 yo male s/p L THA-direct anterior    PT Comments    Progressing with mobility. Plan is for d/c to SNF on today.   Follow Up Recommendations  SNF     Equipment Recommendations  None recommended by PT    Recommendations for Other Services OT consult     Precautions / Restrictions Precautions Precautions: Fall Restrictions Weight Bearing Restrictions: No Other Position/Activity Restrictions: WBAT    Mobility  Bed Mobility                  Transfers Overall transfer level: Needs assistance Equipment used: Rolling walker (2 wheeled) Transfers: Sit to/from Stand Sit to Stand: Min guard         General transfer comment: close guard for safety  Ambulation/Gait Ambulation/Gait assistance: Min guard Ambulation Distance (Feet): 200 Feet Assistive device: Rolling walker (2 wheeled) Gait Pattern/deviations: Step-to pattern;Step-through pattern;Trunk flexed;Decreased stride length     General Gait Details: close guard for safety.    Stairs            Wheelchair Mobility    Modified Rankin (Stroke Patients Only)       Balance                                    Cognition Arousal/Alertness: Awake/alert Behavior During Therapy: WFL for tasks assessed/performed Overall Cognitive Status: Within Functional Limits for tasks assessed                      Exercises Total Joint Exercises Ankle Circles/Pumps: AROM;Both;10 reps;Seated Quad Sets: AROM;Both;10 reps;Seated Heel Slides: AAROM;Left;10 reps;Seated Hip ABduction/ADduction: AAROM;Left;10 reps;Seated Long Arc Quad: AROM;10 reps;Seated    General Comments        Pertinent Vitals/Pain 3/10 L hip area. Ice applied end of session    Home Living                      Prior Function            PT Goals  (current goals can now be found in the care plan section) Progress towards PT goals: Progressing toward goals    Frequency  7X/week    PT Plan Current plan remains appropriate    Co-evaluation             End of Session   Activity Tolerance: Patient tolerated treatment well Patient left: in chair;with call bell/phone within reach;with family/visitor present     Time: 8786-7672 PT Time Calculation (min): 16 min  Charges:  $Gait Training: 8-22 mins                    G Codes:      Weston Anna, MPT Pager: [phone]

## 2013-12-01 NOTE — Progress Notes (Signed)
Patient discharged to [facility] in [place].

## 2013-12-01 NOTE — Progress Notes (Signed)
   Subjective: 3 Days Post-Op Procedure(s) (LRB): LEFT TOTAL HIP ARTHROPLASTY ANTERIOR APPROACH (Left)   Patient reports pain as mild, pain controlled. No events throughout the night. Already dressed and ready to go this morning. Denies any CP and SOB. Feels he is getting better each day. Ready to be discharged to skilled nursing facility.  Objective:   VITALS:   Filed Vitals:   12/01/13 0610  BP: 147/91  Pulse: 85  Temp: 97.7 F (36.5 C)  Resp: 16    Neurovascular intact Dorsiflexion/Plantar flexion intact Incision: dressing C/D/I No cellulitis present Compartment soft  LABS  Recent Labs  11/29/13 0420 11/30/13 0420  HGB 11.0* 10.9*  HCT 31.5* 31.4*  WBC 10.6* 12.2*  PLT 187 204     Recent Labs  11/29/13 0420 11/30/13 0420  NA 135* 135*  K 4.8 4.4  BUN 9 18  CREATININE 0.70 0.80  GLUCOSE 116* 103*     Assessment/Plan: 3 Days Post-Op Procedure(s) (LRB): LEFT TOTAL HIP ARTHROPLASTY ANTERIOR APPROACH (Left) Up with therapy Discharge to SNF, [facility] Follow up in 2 weeks at [facility]. Follow up with OLIN,Abbagayle Zaragoza D in 2 weeks.  Contact information:  [facility] [address] Luvina Poirier   PAC  12/01/2013, 7:30 AM

## 2013-12-01 NOTE — Discharge Summary (Signed)
Physician Discharge Summary  Patient ID: MASTON WIGHT MRN: [id number] DOB/AGE: May 09, 1929 80 y.o.  Admit date: 11/28/2013 Discharge date:  12/01/2013  Procedures:  Procedure(s) (LRB): LEFT TOTAL HIP ARTHROPLASTY ANTERIOR APPROACH (Left)  Attending Physician:  Dr. Paralee Cancel   Admission Diagnoses:   Left hip OA / pain  Discharge Diagnoses:  Principal Problem:   S/P left THA, AA Active Problems:   Expected blood loss anemia  Past Medical History  Diagnosis Date  . Arthritis   . Hyperlipidemia   . Hypertension   . Hearing loss   . Radiation 18 yrs ago    for increased psa  . Cancer     lung - had 1/3 of left lung removed, did not require additional treatment  . History of prostate cancer     HPI: Justin Porter, 80 y.o. male, has a history of pain and functional disability in the left hip(s) due to arthritis and patient has failed non-surgical conservative treatments for greater than 12 weeks to include NSAID's and/or analgesics, use of assistive devices and activity modification. Onset of symptoms was gradual starting 1 years ago with gradually worsening course since that time.The patient noted no past surgery on the left hip(s). Patient currently rates pain in the left hip at 9 out of 10 with activity. Patient has night pain, worsening of pain with activity and weight bearing, trendelenberg gait, pain that interfers with activities of daily living and pain with passive range of motion. Patient has evidence of periarticular osteophytes and joint space narrowing by imaging studies. This condition presents safety issues increasing the risk of falls. There is no current active infection. Risks, benefits and expectations were discussed with the patient. Risks including but not limited to the risk of anesthesia, blood clots, nerve damage, blood vessel damage, failure of the prosthesis, infection and up to and including death. Patient understand the risks, benefits and  expectations and wishes to proceed with surgery.  PCP: [name], MD   Discharged Condition: good  Hospital Course:  Patient underwent the above stated procedure on 11/28/2013. Patient tolerated the procedure well and brought to the recovery room in good condition and subsequently to the floor.  POD #1 BP: 157/79 ; Pulse: 77 ; Temp: 97.9 F (36.6 C) ; Resp: 16  Patient reports pain as mild, pain controlled. No events throughout the night.  Neurovascular intact, dorsiflexion/plantar flexion intact, incision: dressing C/D/I, no cellulitis present and compartment soft.   LABS  Basename    HGB  11.0  HCT  31.5   POD #2  BP: 159/87 ; Pulse: 101 ; Temp: 97.7 F (36.5 C) ; Resp: 18  Patient reports pain as mild, pain controlled. No events throughout the night. Denies any CP or SOB. Neurovascular intact, dorsiflexion/plantar flexion intact, incision: dressing C/D/I, no cellulitis present and compartment soft.   LABS  Basename    HGB  10.9  HCT  31.4   POD #3  BP: 147/91 ; Pulse: 85 ; Temp: 97.7 F (36.5 C) ; Resp: 16 Patient reports pain as mild, pain controlled. No events throughout the night. Already dressed and ready to go this morning. Denies any CP and SOB. Feels he is getting better each day. Ready to be discharged to skilled nursing facility. Neurovascular intact, dorsiflexion/plantar flexion intact, incision: dressing C/D/I, no cellulitis present and compartment soft.   LABS   No new labs   Discharge Exam: General appearance: alert, cooperative and no distress Extremities: Homans sign is negative, no sign  of DVT, no edema, redness or tenderness in the calves or thighs and no ulcers, gangrene or trophic changes  Disposition:    Skilled nursing facility  with follow up in 2 weeks   Follow-up Information   Follow up with [name], MD. Schedule an appointment as soon as possible for a visit in 2 weeks.   Specialty:  Orthopedic Surgery   Contact information:   [address] [place] [address] [phone]       Discharge Instructions   Call MD / Call 911    Complete by:  As directed   If you experience chest pain or shortness of breath, CALL 911 and be transported to the hospital emergency room.  If you develope a fever above 101 F, pus (white drainage) or increased drainage or redness at the wound, or calf pain, call your surgeon's office.     Change dressing    Complete by:  As directed   Maintain surgical dressing for 10-14 days, or until follow up in the clinic.     Constipation Prevention    Complete by:  As directed   Drink plenty of fluids.  Prune juice may be helpful.  You may use a stool softener, such as Colace (over the counter) 100 mg twice a day.  Use MiraLax (over the counter) for constipation as needed.     Diet - low sodium heart healthy    Complete by:  As directed      Discharge instructions    Complete by:  As directed   Maintain surgical dressing for 10-14 days, or until follow up in the clinic. Follow up in 2 weeks at [facility]. Call with any questions or concerns.     Increase activity slowly as tolerated    Complete by:  As directed      TED hose    Complete by:  As directed   Use stockings (TED hose) for 2 weeks on both leg(s).  You may remove them at night for sleeping.     Weight bearing as tolerated    Complete by:  As directed              Medication List    STOP taking these medications       naproxen sodium 220 MG tablet  Commonly known as:  ANAPROX      TAKE these medications       aspirin 325 MG EC tablet  Take 1 tablet (325 mg total) by mouth 2 (two) times daily.     calcium citrate-vitamin D 315-200 MG-UNIT per tablet  Commonly known as:  CITRACAL+D  Take 1 tablet by mouth daily.     DSS 100 MG Caps  Take 100 mg by mouth 2 (two) times daily.     ferrous sulfate 325 (65 FE) MG tablet  Take 1 tablet (325 mg total) by mouth 3 (three) times daily after meals.       Fish Oil 1200 MG Caps  Take 1 capsule by mouth daily.     furosemide 20 MG tablet  Commonly known as:  LASIX  Take 20 mg by mouth 2 (two) times daily.     HYDROcodone-acetaminophen 7.5-325 MG per tablet  Commonly known as:  NORCO  Take 1-2 tablets by mouth every 4 (four) hours as needed for moderate pain.     multivitamin with minerals Tabs tablet  Take 1 tablet by mouth every morning.     polyethylene glycol  packet  Commonly known as:  MIRALAX / GLYCOLAX  Take 17 g by mouth 2 (two) times daily.     Potassium 99 MG Tabs  Take 1 tablet by mouth every morning.     simvastatin 20 MG tablet  Commonly known as:  ZOCOR  Take 20 mg by mouth every evening.     tiZANidine 4 MG capsule  Commonly known as:  ZANAFLEX  Take 1 capsule (4 mg total) by mouth 3 (three) times daily as needed for muscle spasms.         Signed: West Pugh. Robinn Overholt   PAC  12/01/2013, 7:57 AM

## 2013-12-01 NOTE — Progress Notes (Signed)
Clinical Social Work Department CLINICAL SOCIAL WORK PLACEMENT NOTE 12/01/2013  Patient:  Justin Porter, Justin Porter  Account Number:  [id number] Admit date:  11/28/2013  Clinical Social Worker:  Werner Lean, LCSW  Date/time:  11/29/2013 12:06 PM  Clinical Social Work is seeking post-discharge placement for this patient at the following level of care:   SKILLED NURSING   (*CSW will update this form in Epic as items are completed)     Patient/family provided with [place] Department of Clinical Social Work's list of facilities offering this level of care within the geographic area requested by the patient (or if unable, by the patient's family).  11/29/2013  Patient/family informed of their freedom to choose among providers that offer the needed level of care, that participate in Medicare, Medicaid or managed care program needed by the patient, have an available bed and are willing to accept the patient.    Patient/family informed of [facility]' ownership interest in [facility], as well as of the fact that they are under no obligation to receive care at this facility.  PASARR submitted to EDS on 11/28/2013 PASARR number received from [employer] on 11/28/2013  FL2 transmitted to all facilities in geographic area requested by pt/family on  11/29/2013 FL2 transmitted to all facilities within larger geographic area on   Patient informed that his/her managed care company has contracts with or will negotiate with  certain facilities, including the following:     Patient/family informed of bed offers received:  11/29/2013 Patient chooses bed at [facility] Physician recommends and patient chooses bed at    Patient to be transferred to [facility]  on  12/01/2013 Patient to be transferred to facility by family  The following physician request were entered in Epic:   Additional Comments:  Werner Lean LCSW [phone]

## 2013-12-02 DIAGNOSIS — D638 Anemia in other chronic diseases classified elsewhere: Secondary | ICD-10-CM | POA: Diagnosis not present

## 2013-12-02 DIAGNOSIS — Z96649 Presence of unspecified artificial hip joint: Secondary | ICD-10-CM | POA: Diagnosis not present

## 2013-12-02 DIAGNOSIS — I130 Hypertensive heart and chronic kidney disease with heart failure and stage 1 through stage 4 chronic kidney disease, or unspecified chronic kidney disease: Secondary | ICD-10-CM | POA: Diagnosis not present

## 2013-12-02 DIAGNOSIS — G8918 Other acute postprocedural pain: Secondary | ICD-10-CM | POA: Diagnosis not present

## 2013-12-12 DIAGNOSIS — M6281 Muscle weakness (generalized): Secondary | ICD-10-CM | POA: Diagnosis not present

## 2013-12-12 DIAGNOSIS — M25559 Pain in unspecified hip: Secondary | ICD-10-CM | POA: Diagnosis not present

## 2013-12-12 DIAGNOSIS — R269 Unspecified abnormalities of gait and mobility: Secondary | ICD-10-CM | POA: Diagnosis not present

## 2013-12-12 DIAGNOSIS — Z96649 Presence of unspecified artificial hip joint: Secondary | ICD-10-CM | POA: Diagnosis not present

## 2013-12-12 DIAGNOSIS — IMO0001 Reserved for inherently not codable concepts without codable children: Secondary | ICD-10-CM | POA: Diagnosis not present

## 2013-12-18 DIAGNOSIS — R269 Unspecified abnormalities of gait and mobility: Secondary | ICD-10-CM | POA: Diagnosis not present

## 2013-12-18 DIAGNOSIS — M25559 Pain in unspecified hip: Secondary | ICD-10-CM | POA: Diagnosis not present

## 2013-12-18 DIAGNOSIS — IMO0001 Reserved for inherently not codable concepts without codable children: Secondary | ICD-10-CM | POA: Diagnosis not present

## 2013-12-18 DIAGNOSIS — Z96649 Presence of unspecified artificial hip joint: Secondary | ICD-10-CM | POA: Diagnosis not present

## 2013-12-18 DIAGNOSIS — M6281 Muscle weakness (generalized): Secondary | ICD-10-CM | POA: Diagnosis not present

## 2013-12-29 DIAGNOSIS — L570 Actinic keratosis: Secondary | ICD-10-CM | POA: Diagnosis not present

## 2013-12-29 DIAGNOSIS — Z85828 Personal history of other malignant neoplasm of skin: Secondary | ICD-10-CM | POA: Diagnosis not present

## 2013-12-29 DIAGNOSIS — D1801 Hemangioma of skin and subcutaneous tissue: Secondary | ICD-10-CM | POA: Diagnosis not present

## 2014-01-11 DIAGNOSIS — Z9660 Presence of unspecified orthopedic joint implant: Secondary | ICD-10-CM | POA: Diagnosis not present

## 2014-01-17 DIAGNOSIS — Z96649 Presence of unspecified artificial hip joint: Secondary | ICD-10-CM | POA: Diagnosis not present

## 2014-01-17 DIAGNOSIS — R269 Unspecified abnormalities of gait and mobility: Secondary | ICD-10-CM | POA: Diagnosis not present

## 2014-01-17 DIAGNOSIS — IMO0001 Reserved for inherently not codable concepts without codable children: Secondary | ICD-10-CM | POA: Diagnosis not present

## 2014-01-17 DIAGNOSIS — M25559 Pain in unspecified hip: Secondary | ICD-10-CM | POA: Diagnosis not present

## 2014-01-17 DIAGNOSIS — M6281 Muscle weakness (generalized): Secondary | ICD-10-CM | POA: Diagnosis not present

## 2014-01-18 DIAGNOSIS — IMO0001 Reserved for inherently not codable concepts without codable children: Secondary | ICD-10-CM | POA: Diagnosis not present

## 2014-01-18 DIAGNOSIS — M25559 Pain in unspecified hip: Secondary | ICD-10-CM | POA: Diagnosis not present

## 2014-01-18 DIAGNOSIS — Z96649 Presence of unspecified artificial hip joint: Secondary | ICD-10-CM | POA: Diagnosis not present

## 2014-01-18 DIAGNOSIS — R269 Unspecified abnormalities of gait and mobility: Secondary | ICD-10-CM | POA: Diagnosis not present

## 2014-01-18 DIAGNOSIS — M6281 Muscle weakness (generalized): Secondary | ICD-10-CM | POA: Diagnosis not present

## 2014-01-22 DIAGNOSIS — M6281 Muscle weakness (generalized): Secondary | ICD-10-CM | POA: Diagnosis not present

## 2014-01-22 DIAGNOSIS — R269 Unspecified abnormalities of gait and mobility: Secondary | ICD-10-CM | POA: Diagnosis not present

## 2014-01-22 DIAGNOSIS — M25559 Pain in unspecified hip: Secondary | ICD-10-CM | POA: Diagnosis not present

## 2014-01-22 DIAGNOSIS — IMO0001 Reserved for inherently not codable concepts without codable children: Secondary | ICD-10-CM | POA: Diagnosis not present

## 2014-01-22 DIAGNOSIS — Z96649 Presence of unspecified artificial hip joint: Secondary | ICD-10-CM | POA: Diagnosis not present

## 2014-01-24 DIAGNOSIS — Z96649 Presence of unspecified artificial hip joint: Secondary | ICD-10-CM | POA: Diagnosis not present

## 2014-01-24 DIAGNOSIS — M6281 Muscle weakness (generalized): Secondary | ICD-10-CM | POA: Diagnosis not present

## 2014-01-24 DIAGNOSIS — R269 Unspecified abnormalities of gait and mobility: Secondary | ICD-10-CM | POA: Diagnosis not present

## 2014-01-24 DIAGNOSIS — M25559 Pain in unspecified hip: Secondary | ICD-10-CM | POA: Diagnosis not present

## 2014-01-24 DIAGNOSIS — IMO0001 Reserved for inherently not codable concepts without codable children: Secondary | ICD-10-CM | POA: Diagnosis not present

## 2014-01-26 DIAGNOSIS — R269 Unspecified abnormalities of gait and mobility: Secondary | ICD-10-CM | POA: Diagnosis not present

## 2014-01-26 DIAGNOSIS — IMO0001 Reserved for inherently not codable concepts without codable children: Secondary | ICD-10-CM | POA: Diagnosis not present

## 2014-01-26 DIAGNOSIS — Z96649 Presence of unspecified artificial hip joint: Secondary | ICD-10-CM | POA: Diagnosis not present

## 2014-01-26 DIAGNOSIS — M25559 Pain in unspecified hip: Secondary | ICD-10-CM | POA: Diagnosis not present

## 2014-01-26 DIAGNOSIS — M6281 Muscle weakness (generalized): Secondary | ICD-10-CM | POA: Diagnosis not present

## 2014-01-29 DIAGNOSIS — IMO0001 Reserved for inherently not codable concepts without codable children: Secondary | ICD-10-CM | POA: Diagnosis not present

## 2014-01-29 DIAGNOSIS — R269 Unspecified abnormalities of gait and mobility: Secondary | ICD-10-CM | POA: Diagnosis not present

## 2014-01-29 DIAGNOSIS — Z96649 Presence of unspecified artificial hip joint: Secondary | ICD-10-CM | POA: Diagnosis not present

## 2014-01-29 DIAGNOSIS — M6281 Muscle weakness (generalized): Secondary | ICD-10-CM | POA: Diagnosis not present

## 2014-01-29 DIAGNOSIS — C61 Malignant neoplasm of prostate: Secondary | ICD-10-CM | POA: Diagnosis not present

## 2014-01-29 DIAGNOSIS — M25559 Pain in unspecified hip: Secondary | ICD-10-CM | POA: Diagnosis not present

## 2014-01-31 DIAGNOSIS — M6281 Muscle weakness (generalized): Secondary | ICD-10-CM | POA: Diagnosis not present

## 2014-01-31 DIAGNOSIS — M25559 Pain in unspecified hip: Secondary | ICD-10-CM | POA: Diagnosis not present

## 2014-01-31 DIAGNOSIS — IMO0001 Reserved for inherently not codable concepts without codable children: Secondary | ICD-10-CM | POA: Diagnosis not present

## 2014-01-31 DIAGNOSIS — Z96649 Presence of unspecified artificial hip joint: Secondary | ICD-10-CM | POA: Diagnosis not present

## 2014-01-31 DIAGNOSIS — R269 Unspecified abnormalities of gait and mobility: Secondary | ICD-10-CM | POA: Diagnosis not present

## 2014-02-02 DIAGNOSIS — C61 Malignant neoplasm of prostate: Secondary | ICD-10-CM | POA: Diagnosis not present

## 2014-02-02 DIAGNOSIS — C7952 Secondary malignant neoplasm of bone marrow: Secondary | ICD-10-CM | POA: Diagnosis not present

## 2014-02-02 DIAGNOSIS — R269 Unspecified abnormalities of gait and mobility: Secondary | ICD-10-CM | POA: Diagnosis not present

## 2014-02-02 DIAGNOSIS — Z96649 Presence of unspecified artificial hip joint: Secondary | ICD-10-CM | POA: Diagnosis not present

## 2014-02-02 DIAGNOSIS — IMO0001 Reserved for inherently not codable concepts without codable children: Secondary | ICD-10-CM | POA: Diagnosis not present

## 2014-02-02 DIAGNOSIS — C7951 Secondary malignant neoplasm of bone: Secondary | ICD-10-CM | POA: Diagnosis not present

## 2014-02-02 DIAGNOSIS — M25559 Pain in unspecified hip: Secondary | ICD-10-CM | POA: Diagnosis not present

## 2014-02-02 DIAGNOSIS — M6281 Muscle weakness (generalized): Secondary | ICD-10-CM | POA: Diagnosis not present

## 2014-02-05 DIAGNOSIS — Z96649 Presence of unspecified artificial hip joint: Secondary | ICD-10-CM | POA: Diagnosis not present

## 2014-02-05 DIAGNOSIS — M6281 Muscle weakness (generalized): Secondary | ICD-10-CM | POA: Diagnosis not present

## 2014-02-05 DIAGNOSIS — R269 Unspecified abnormalities of gait and mobility: Secondary | ICD-10-CM | POA: Diagnosis not present

## 2014-02-05 DIAGNOSIS — IMO0001 Reserved for inherently not codable concepts without codable children: Secondary | ICD-10-CM | POA: Diagnosis not present

## 2014-02-05 DIAGNOSIS — M25559 Pain in unspecified hip: Secondary | ICD-10-CM | POA: Diagnosis not present

## 2014-02-06 ENCOUNTER — Telehealth: Payer: Self-pay | Admitting: Oncology

## 2014-02-06 NOTE — Telephone Encounter (Signed)
C/D 02/06/14 for appt. 02/14/14

## 2014-02-06 NOTE — Telephone Encounter (Signed)
[name] [name] AND GAVE NP APPT FOR 07/29 @ 1;30 W/DR. SHADAD.  [place]

## 2014-02-08 DIAGNOSIS — Z471 Aftercare following joint replacement surgery: Secondary | ICD-10-CM | POA: Diagnosis not present

## 2014-02-08 DIAGNOSIS — Z9660 Presence of unspecified orthopedic joint implant: Secondary | ICD-10-CM | POA: Diagnosis not present

## 2014-02-08 DIAGNOSIS — H6120 Impacted cerumen, unspecified ear: Secondary | ICD-10-CM | POA: Diagnosis not present

## 2014-02-08 DIAGNOSIS — M545 Low back pain, unspecified: Secondary | ICD-10-CM | POA: Diagnosis not present

## 2014-02-08 DIAGNOSIS — S339XXA Sprain of unspecified parts of lumbar spine and pelvis, initial encounter: Secondary | ICD-10-CM | POA: Diagnosis not present

## 2014-02-09 ENCOUNTER — Other Ambulatory Visit: Payer: Self-pay | Admitting: Oncology

## 2014-02-09 DIAGNOSIS — Z8546 Personal history of malignant neoplasm of prostate: Secondary | ICD-10-CM

## 2014-02-13 ENCOUNTER — Telehealth: Payer: Self-pay | Admitting: Medical Oncology

## 2014-02-13 NOTE — Telephone Encounter (Signed)
Call to patient to confirm tomorrow's appt with Dr. Alen Blew. Patient informed to bring current list of medications, denies questions.

## 2014-02-14 ENCOUNTER — Other Ambulatory Visit (HOSPITAL_BASED_OUTPATIENT_CLINIC_OR_DEPARTMENT_OTHER): Payer: Medicare Other

## 2014-02-14 ENCOUNTER — Telehealth: Payer: Self-pay | Admitting: Oncology

## 2014-02-14 ENCOUNTER — Encounter: Payer: Self-pay | Admitting: Oncology

## 2014-02-14 ENCOUNTER — Ambulatory Visit: Payer: Medicare Other

## 2014-02-14 ENCOUNTER — Ambulatory Visit (HOSPITAL_BASED_OUTPATIENT_CLINIC_OR_DEPARTMENT_OTHER): Payer: Medicare Other | Admitting: Oncology

## 2014-02-14 VITALS — BP 148/57 | HR 67 | Temp 98.3°F | Resp 18 | Ht 72.0 in | Wt 178.4 lb

## 2014-02-14 DIAGNOSIS — R972 Elevated prostate specific antigen [PSA]: Secondary | ICD-10-CM | POA: Diagnosis not present

## 2014-02-14 DIAGNOSIS — M899 Disorder of bone, unspecified: Secondary | ICD-10-CM

## 2014-02-14 DIAGNOSIS — M949 Disorder of cartilage, unspecified: Secondary | ICD-10-CM

## 2014-02-14 DIAGNOSIS — C61 Malignant neoplasm of prostate: Secondary | ICD-10-CM | POA: Insufficient documentation

## 2014-02-14 DIAGNOSIS — Z85118 Personal history of other malignant neoplasm of bronchus and lung: Secondary | ICD-10-CM

## 2014-02-14 DIAGNOSIS — Z96659 Presence of unspecified artificial knee joint: Secondary | ICD-10-CM | POA: Diagnosis not present

## 2014-02-14 DIAGNOSIS — Z8546 Personal history of malignant neoplasm of prostate: Secondary | ICD-10-CM

## 2014-02-14 LAB — COMPREHENSIVE METABOLIC PANEL (CC13)
ALK PHOS: 140 U/L (ref 40–150)
ALT: 11 U/L (ref 0–55)
AST: 14 U/L (ref 5–34)
Albumin: 3.6 g/dL (ref 3.5–5.0)
Anion Gap: 8 mEq/L (ref 3–11)
BUN: 16.2 mg/dL (ref 7.0–26.0)
CALCIUM: 9.1 mg/dL (ref 8.4–10.4)
CHLORIDE: 100 meq/L (ref 98–109)
CO2: 30 mEq/L — ABNORMAL HIGH (ref 22–29)
Creatinine: 0.9 mg/dL (ref 0.7–1.3)
Glucose: 110 mg/dl (ref 70–140)
POTASSIUM: 3.4 meq/L — AB (ref 3.5–5.1)
Sodium: 138 mEq/L (ref 136–145)
TOTAL PROTEIN: 6.9 g/dL (ref 6.4–8.3)
Total Bilirubin: 0.28 mg/dL (ref 0.20–1.20)

## 2014-02-14 LAB — CBC WITH DIFFERENTIAL/PLATELET
BASO%: 0.8 % (ref 0.0–2.0)
BASOS ABS: 0.1 10*3/uL (ref 0.0–0.1)
EOS%: 0.6 % (ref 0.0–7.0)
Eosinophils Absolute: 0 10*3/uL (ref 0.0–0.5)
HCT: 38.6 % (ref 38.4–49.9)
HEMOGLOBIN: 12.5 g/dL — AB (ref 13.0–17.1)
LYMPH#: 1.7 10*3/uL (ref 0.9–3.3)
LYMPH%: 21 % (ref 14.0–49.0)
MCH: 30.4 pg (ref 27.2–33.4)
MCHC: 32.5 g/dL (ref 32.0–36.0)
MCV: 93.7 fL (ref 79.3–98.0)
MONO#: 0.6 10*3/uL (ref 0.1–0.9)
MONO%: 7.9 % (ref 0.0–14.0)
NEUT#: 5.7 10*3/uL (ref 1.5–6.5)
NEUT%: 69.7 % (ref 39.0–75.0)
Platelets: 281 10*3/uL (ref 140–400)
RBC: 4.12 10*6/uL — ABNORMAL LOW (ref 4.20–5.82)
RDW: 13.1 % (ref 11.0–14.6)
WBC: 8.2 10*3/uL (ref 4.0–10.3)

## 2014-02-14 NOTE — Progress Notes (Signed)
Please see consult note.  

## 2014-02-14 NOTE — Telephone Encounter (Signed)
Pt confirmed labs/ov per 07/29 POF, gave pt AVS....KJ

## 2014-02-14 NOTE — Consult Note (Signed)
Reason for Referral: Prostate cancer.   HPI: 80 year old gentleman currently of  where he lived the majority of his life. He is a elderly frail gentleman with history of hypertension and prostate cancer that dates back to 1995. At that time he underwent a prostatectomy under the care of Dr. Reece Agar he was found to have an elevated PSA of 7.1 and a biopsy confirmed the presence of adenocarcinoma Clayson score 3+2. He remained disease free until about 2002 where he developed biochemical relapse and was treated with salvage radiation. In 2003, he underwent a thoracotomy for the left upper lobe solitary pulmonary nodule  And found to have adenocarcinoma with possible prostate cancer metastasis. He was treated with androgen deprivation intermittently since 2003. He established care with Dr. Alinda Money in 2011 and remained on continuous androgen deprivation since July of 2013. He was found to have a questionable metastatic bony disease at the thoracic and lumbar spine based on bone scan images. His most recent imaging studies done in January of 2015 showed increased activity in both pubic bodies correlating with sclerosis consistent with chronic osteomyelitis. He also have activity in the lumbar sacral junction concerning for metastatic disease. His CT scan of the abdomen and pelvis did not show any visceral metastasis or adenopathy. His most recent PSA on 01/29/2014 was 23 which was elevated from 17 in April 2015. In January was 16 and a 04/18/2013 was 11.2. His doubling time at this time is more than 6 months. He was referred to me for evaluation of his castration resistant disease. Most recently, he underwent left total hip replacement related to osteoarthritis on 11/28/2013. He is currently recovering slowly from that operation.  Clinically, he is asymptomatic from prostate cancer. He does not report any constitutional symptoms of fevers or chills or weight loss. He does not report any neurological  symptoms of headaches or blurry vision or syncope. Is not reporting any seizures or dementia. His mobility has been limited related to his surgery and currently uses a cane with no falls. He has reported increased hip pain and increase thigh with physical therapy. He has not reported any chest pain or palpitation or leg edema. He has not reported any cough or hemoptysis. He has not reported any nausea or vomiting or abdominal pain. He does not report any frequency urgency or hesitancy or hematuria. He does not report any lymphadenopathy or petechiae. He does not report any skin rashes or lesions. He does not report any depression or side he does report hearing loss. Rest of his review of systems unremarkable.   Past Medical History  Diagnosis Date  . Arthritis   . Hyperlipidemia   . Hypertension   . Hearing loss   . Radiation 18 yrs ago    for increased psa  . Cancer     lung - had 1/3 of left lung removed, did not require additional treatment  . History of prostate cancer   :  Past Surgical History  Procedure Laterality Date  . Elbow surgery      left  surgery for fx 3 yrs ago, right done 40 yrs ago  . Lobectomy  2005  . Prostate surgery  1995  . Lung biopsy  2005    resulted in 1/3 of left lung being removed  . Back surgery  4-5 yrs ago    lower back  . Laparoscopic appendectomy  10/15/2011    Procedure: APPENDECTOMY LAPAROSCOPIC;  Surgeon: Stark Klein, MD;  Location: [facility];  Service: General;  Laterality: N/A;  . Appendectomy  2013  . Hernia repair  01-14-12    2003-left  . Inguinal hernia repair  01/22/2012    Procedure: HERNIA REPAIR INGUINAL ADULT;  Surgeon: Adin Hector, MD;  Location: [facility];  Service: General;  Laterality: Right;  . Joint replacement  2012    L TOTAL KNEE  . Total hip arthroplasty Left 11/28/2013    Procedure: LEFT TOTAL HIP ARTHROPLASTY ANTERIOR APPROACH;  Surgeon: [name], MD;  Location: [facility];  Service: Orthopedics;  Laterality: Left;   :  Current Outpatient Prescriptions  Medication Sig Dispense Refill  . calcium citrate-vitamin D (CITRACAL+D) 315-200 MG-UNIT per tablet Take 1 tablet by mouth daily.      . ferrous sulfate 325 (65 FE) MG tablet Take 1 tablet (325 mg total) by mouth 3 (three) times daily after meals.    3  . furosemide (LASIX) 20 MG tablet Take 20 mg by mouth 2 (two) times daily.      Marland Kitchen HYDROcodone-acetaminophen (NORCO) 7.5-325 MG per tablet Take 1-2 tablets by mouth every 4 (four) hours as needed for moderate pain.  100 tablet  0  . Multiple Vitamin (MULITIVITAMIN WITH MINERALS) TABS Take 1 tablet by mouth every morning.      . Omega-3 Fatty Acids (FISH OIL) 1200 MG CAPS Take 1 capsule by mouth daily.       . polyethylene glycol (MIRALAX / GLYCOLAX) packet Take 17 g by mouth 2 (two) times daily.  14 each  0  . Potassium 99 MG TABS Take 1 tablet by mouth every morning.      . simvastatin (ZOCOR) 20 MG tablet Take 20 mg by mouth every evening.       Marland Kitchen tiZANidine (ZANAFLEX) 4 MG capsule Take 1 capsule (4 mg total) by mouth 3 (three) times daily as needed for muscle spasms.  30 capsule  0   No current facility-administered medications for this visit.       Allergies  Allergen Reactions  . Codeine Nausea And Vomiting  . Shrimp [Shellfish Allergy] Nausea And Vomiting  :  No family history on file.:  History   Social History  . Marital Status: Married    Spouse Name: N/A    Number of Children: N/A  . Years of Education: N/A   Occupational History  . Not on file.   Social History Main Topics  . Smoking status: Never Smoker   . Smokeless tobacco: Never Used  . Alcohol Use: No  . Drug Use: No  . Sexual Activity: Yes   Other Topics Concern  . Not on file   Social History Narrative  . No narrative on file  :  Pertinent items are noted in HPI.  Exam: Blood pressure 148/57, pulse 67, temperature 98.3 F (36.8 C), temperature source Oral, resp. rate 18, height 6' (1.829 m), weight 178 lb  6.4 oz (80.922 kg). General appearance: alert and cooperative Head: Normocephalic, without obvious abnormality Throat: lips, mucosa, and tongue normal; teeth and gums normal Neck: no adenopathy Back: symmetric, no curvature. ROM normal. No CVA tenderness. Resp: clear to auscultation bilaterally Cardio: regular rate and rhythm, S1, S2 normal, no murmur, click, rub or gallop GI: soft, non-tender; bowel sounds normal; no masses,  no organomegaly Extremities: extremities normal, atraumatic, no cyanosis or edema Pulses: 2+ and symmetric   Recent Labs  02/14/14 1350  WBC 8.2  HGB 12.5*  HCT 38.6  PLT 281      Assessment and  Plan:   80 year old gentleman with the following issues:  1. Castration resistant metastatic prostate cancer with disease to the bone. His initial diagnosis was in 1995 status post prostatectomy and subsequently developed biochemical relapse treated with salvage radiation therapy. He did develop presumed pulmonary metastasis status post surgical resection and he has been on androgen depravation initially intermittently and subsequently continuously since 2013. Most recently he developed a rise in his PSA with a doubling time slightly above 6 months. The natural course of this disease was discussed including treatment options. The first up and establishing the best plan of care would be to restage him since his last staging workup was over 6 months ago. Once that has been accomplished, we can determine the best approach. Second line hormonal manipulation would be a reasonable option utilizing Casodex, ketoconazole and prednisone, Zytiga, Xtandi, and Provenge immunotherapy. I think systemic chemotherapy is probably not a reasonable option at this time given the bulk of his disease and his frail status. I plan on repeating his bone scan and a followup visit in about 6 weeks which will allow him to recover from his recent operation and continue physical therapy. I feel the  patient has disease although more rapid as of late, continues to be less worrisome and offers some time to observe the rate of his disease.  2. Androgen depravation: Have recommended he continues Lupron which he has been receiving every 6 months.  3. Bone directed therapy: I agree with Dr. Lynne Logan assessment that he would be a candidate for Xgevae once he obtains dental clearance.

## 2014-02-15 LAB — PSA: PSA: 12.05 ng/mL — ABNORMAL HIGH (ref <=4.00)

## 2014-02-15 LAB — TESTOSTERONE: TESTOSTERONE: 20 ng/dL — AB (ref 300–890)

## 2014-02-21 DIAGNOSIS — Z0000 Encounter for general adult medical examination without abnormal findings: Secondary | ICD-10-CM | POA: Diagnosis not present

## 2014-02-21 DIAGNOSIS — M1990 Unspecified osteoarthritis, unspecified site: Secondary | ICD-10-CM | POA: Diagnosis not present

## 2014-02-21 DIAGNOSIS — I10 Essential (primary) hypertension: Secondary | ICD-10-CM | POA: Diagnosis not present

## 2014-02-21 DIAGNOSIS — Z1331 Encounter for screening for depression: Secondary | ICD-10-CM | POA: Diagnosis not present

## 2014-02-21 DIAGNOSIS — Z23 Encounter for immunization: Secondary | ICD-10-CM | POA: Diagnosis not present

## 2014-02-21 DIAGNOSIS — E782 Mixed hyperlipidemia: Secondary | ICD-10-CM | POA: Diagnosis not present

## 2014-02-21 DIAGNOSIS — C61 Malignant neoplasm of prostate: Secondary | ICD-10-CM | POA: Diagnosis not present

## 2014-02-21 DIAGNOSIS — M5137 Other intervertebral disc degeneration, lumbosacral region: Secondary | ICD-10-CM | POA: Diagnosis not present

## 2014-03-02 DIAGNOSIS — M545 Low back pain, unspecified: Secondary | ICD-10-CM | POA: Diagnosis not present

## 2014-03-02 DIAGNOSIS — M47817 Spondylosis without myelopathy or radiculopathy, lumbosacral region: Secondary | ICD-10-CM | POA: Diagnosis not present

## 2014-03-02 DIAGNOSIS — Z981 Arthrodesis status: Secondary | ICD-10-CM | POA: Diagnosis not present

## 2014-03-02 DIAGNOSIS — M961 Postlaminectomy syndrome, not elsewhere classified: Secondary | ICD-10-CM | POA: Diagnosis not present

## 2014-03-16 DIAGNOSIS — Z96649 Presence of unspecified artificial hip joint: Secondary | ICD-10-CM | POA: Diagnosis not present

## 2014-03-16 DIAGNOSIS — Z471 Aftercare following joint replacement surgery: Secondary | ICD-10-CM | POA: Diagnosis not present

## 2014-03-29 ENCOUNTER — Telehealth: Payer: Self-pay | Admitting: Medical Oncology

## 2014-03-29 NOTE — Telephone Encounter (Signed)
Per [facility]'s request, last office notes faxed to [phone]

## 2014-04-02 ENCOUNTER — Other Ambulatory Visit (HOSPITAL_BASED_OUTPATIENT_CLINIC_OR_DEPARTMENT_OTHER): Payer: Medicare Other

## 2014-04-02 ENCOUNTER — Ambulatory Visit (HOSPITAL_COMMUNITY)
Admission: RE | Admit: 2014-04-02 | Discharge: 2014-04-02 | Disposition: A | Discharge status: Home / Self Care | Payer: Medicare Other | Source: Ambulatory Visit | Attending: Oncology | Admitting: Oncology

## 2014-04-02 ENCOUNTER — Encounter (HOSPITAL_COMMUNITY)
Admission: RE | Admit: 2014-04-02 | Discharge: 2014-04-02 | Disposition: A | Discharge status: Home / Self Care | Payer: Medicare Other | Source: Ambulatory Visit | Attending: Oncology | Admitting: Oncology

## 2014-04-02 ENCOUNTER — Encounter: Payer: Self-pay | Admitting: *Deleted

## 2014-04-02 DIAGNOSIS — C50919 Malignant neoplasm of unspecified site of unspecified female breast: Secondary | ICD-10-CM | POA: Diagnosis not present

## 2014-04-02 DIAGNOSIS — C61 Malignant neoplasm of prostate: Secondary | ICD-10-CM

## 2014-04-02 DIAGNOSIS — C7951 Secondary malignant neoplasm of bone: Secondary | ICD-10-CM | POA: Diagnosis not present

## 2014-04-02 DIAGNOSIS — Z8546 Personal history of malignant neoplasm of prostate: Secondary | ICD-10-CM

## 2014-04-02 DIAGNOSIS — C7952 Secondary malignant neoplasm of bone marrow: Secondary | ICD-10-CM

## 2014-04-02 DIAGNOSIS — C779 Secondary and unspecified malignant neoplasm of lymph node, unspecified: Secondary | ICD-10-CM | POA: Diagnosis not present

## 2014-04-02 LAB — COMPREHENSIVE METABOLIC PANEL (CC13)
ALT: 14 U/L (ref 0–55)
AST: 20 U/L (ref 5–34)
Albumin: 3.8 g/dL (ref 3.5–5.0)
Alkaline Phosphatase: 139 U/L (ref 40–150)
Anion Gap: 11 mEq/L (ref 3–11)
BUN: 12.8 mg/dL (ref 7.0–26.0)
CALCIUM: 9.4 mg/dL (ref 8.4–10.4)
CHLORIDE: 99 meq/L (ref 98–109)
CO2: 30 meq/L — AB (ref 22–29)
CREATININE: 0.9 mg/dL (ref 0.7–1.3)
GLUCOSE: 94 mg/dL (ref 70–140)
Potassium: 4 mEq/L (ref 3.5–5.1)
Sodium: 139 mEq/L (ref 136–145)
Total Bilirubin: 0.42 mg/dL (ref 0.20–1.20)
Total Protein: 7.5 g/dL (ref 6.4–8.3)

## 2014-04-02 LAB — CBC WITH DIFFERENTIAL/PLATELET
BASO%: 0.3 % (ref 0.0–2.0)
BASOS ABS: 0 10*3/uL (ref 0.0–0.1)
EOS%: 1.4 % (ref 0.0–7.0)
Eosinophils Absolute: 0.1 10*3/uL (ref 0.0–0.5)
HEMATOCRIT: 40.1 % (ref 38.4–49.9)
HEMOGLOBIN: 13.3 g/dL (ref 13.0–17.1)
LYMPH#: 1.8 10*3/uL (ref 0.9–3.3)
LYMPH%: 26.6 % (ref 14.0–49.0)
MCH: 30.7 pg (ref 27.2–33.4)
MCHC: 33.2 g/dL (ref 32.0–36.0)
MCV: 92.6 fL (ref 79.3–98.0)
MONO#: 0.5 10*3/uL (ref 0.1–0.9)
MONO%: 7.9 % (ref 0.0–14.0)
NEUT#: 4.2 10*3/uL (ref 1.5–6.5)
NEUT%: 63.8 % (ref 39.0–75.0)
PLATELETS: 253 10*3/uL (ref 140–400)
RBC: 4.33 10*6/uL (ref 4.20–5.82)
RDW: 13.6 % (ref 11.0–14.6)
WBC: 6.6 10*3/uL (ref 4.0–10.3)

## 2014-04-02 MED ORDER — TECHNETIUM TC 99M MEDRONATE IV KIT
25.7000 | PACK | Freq: Once | INTRAVENOUS | Status: AC | PRN
  Administered 2014-04-02: 25.7 via INTRAVENOUS

## 2014-04-03 LAB — PSA: PSA: 19.35 ng/mL — ABNORMAL HIGH (ref <=4.00)

## 2014-04-05 ENCOUNTER — Ambulatory Visit: Payer: Medicare Other | Admitting: Oncology

## 2014-04-05 ENCOUNTER — Telehealth: Payer: Self-pay | Admitting: Oncology

## 2014-04-05 ENCOUNTER — Other Ambulatory Visit: Payer: Self-pay | Admitting: *Deleted

## 2014-04-05 NOTE — Telephone Encounter (Signed)
Spk w/wife confirming apt 09/17 r/s to 09/18 ok per MD at 9:15...Marland KitchenMarland KitchenKJ

## 2014-04-05 NOTE — Progress Notes (Signed)
Spoke with wife, she did not know they had an appt here today. pof to schedulers to r/s first available. Dr Alen Blew notified.

## 2014-04-06 ENCOUNTER — Telehealth: Payer: Self-pay | Admitting: Oncology

## 2014-04-06 ENCOUNTER — Ambulatory Visit (HOSPITAL_BASED_OUTPATIENT_CLINIC_OR_DEPARTMENT_OTHER): Payer: Medicare Other | Admitting: Oncology

## 2014-04-06 ENCOUNTER — Encounter: Payer: Self-pay | Admitting: Oncology

## 2014-04-06 VITALS — BP 154/67 | HR 81 | Temp 97.9°F | Resp 18 | Ht 72.0 in | Wt 179.9 lb

## 2014-04-06 DIAGNOSIS — C7951 Secondary malignant neoplasm of bone: Secondary | ICD-10-CM

## 2014-04-06 DIAGNOSIS — C61 Malignant neoplasm of prostate: Secondary | ICD-10-CM | POA: Diagnosis not present

## 2014-04-06 DIAGNOSIS — C7952 Secondary malignant neoplasm of bone marrow: Secondary | ICD-10-CM

## 2014-04-06 NOTE — Progress Notes (Signed)
Hematology and Oncology Follow Up Visit  Justin Porter [id number] April 07, 1929 80 y.o. 04/06/2014 9:09 AM Justin Porter, MDHusain, Justin Ar, MD   Principle Diagnosis: 80 year old gentleman with Castration resistant metastatic prostate cancer with disease to the bone. His initial diagnosis was in 1995.  Prior Therapy: He is status post prostatectomy and subsequently developed biochemical relapse treated with salvage radiation therapy. He did develop presumed pulmonary metastasis status post surgical resection and he has been on androgen depravation initially intermittently and subsequently continuously since 2013. Most recently he developed a rise in his PSA with a doubling time slightly above 6 months.    Current therapy:  Androgen deprivation.  Interim History:  Justin Porter presents today for a followup visit with his wife. This is a pleasant visit, he continue to rehabilitation from his recent operation on his hip and he has been doing well. His mobility is improved and he resumed most activities of daily living. He still has some slowness in movement related to a muscle pull in his groin. Otherwise he does not report any bony symptoms.He does not report any constitutional symptoms of fevers or chills or weight loss. He does not report any neurological symptoms of headaches or blurry vision or syncope. Is not reporting any seizures or dementia. He has not reported any chest pain or palpitation or leg edema. He has not reported any cough or hemoptysis. He has not reported any nausea or vomiting or abdominal pain. He does not report any frequency urgency or hesitancy or hematuria. He does not report any lymphadenopathy or petechiae. He does not report any skin rashes or lesions. He does not report any depression or side he does report hearing loss. Rest of his review of systems unremarkable.      Medications: I have reviewed the patient's current medications.  Current Outpatient Prescriptions   Medication Sig Dispense Refill  . calcium citrate-vitamin D (CITRACAL+D) 315-200 MG-UNIT per tablet Take 1 tablet by mouth daily.      . ferrous sulfate 325 (65 FE) MG tablet Take 1 tablet (325 mg total) by mouth 3 (three) times daily after meals.    3  . furosemide (LASIX) 20 MG tablet Take 20 mg by mouth 2 (two) times daily.      Marland Kitchen HYDROcodone-acetaminophen (NORCO) 7.5-325 MG per tablet Take 1-2 tablets by mouth every 4 (four) hours as needed for moderate pain.  100 tablet  0  . Multiple Vitamin (MULITIVITAMIN WITH MINERALS) TABS Take 1 tablet by mouth every morning.      . Omega-3 Fatty Acids (FISH OIL) 1200 MG CAPS Take 1 capsule by mouth daily.       . polyethylene glycol (MIRALAX / GLYCOLAX) packet Take 17 g by mouth 2 (two) times daily.  14 each  0  . Potassium 99 MG TABS Take 1 tablet by mouth every morning.      . simvastatin (ZOCOR) 20 MG tablet Take 20 mg by mouth every evening.       Marland Kitchen tiZANidine (ZANAFLEX) 4 MG capsule Take 1 capsule (4 mg total) by mouth 3 (three) times daily as needed for muscle spasms.  30 capsule  0   No current facility-administered medications for this visit.     Allergies:  Allergies  Allergen Reactions  . Codeine Nausea And Vomiting  . Shrimp [Shellfish Allergy] Nausea And Vomiting    Past Medical History, Surgical history, Social history, and Family History were reviewed and updated.   Physical Exam: Blood pressure 154/67, pulse 81, temperature  97.9 F (36.6 C), temperature source Oral, resp. rate 18, height 6' (1.829 m), weight 179 lb 14.4 oz (81.602 kg), SpO2 100.00%. ECOG: 1 General appearance: alert and cooperative Head: Normocephalic, without obvious abnormality Neck: no adenopathy Lymph nodes: Cervical, supraclavicular, and axillary nodes normal. Heart:regular rate and rhythm, S1, S2 normal, no murmur, click, rub or gallop Lung:chest clear, no wheezing, rales, normal symmetric air entry Abdomin: soft, non-tender, without masses or  organomegaly EXT:no erythema, induration, or nodules   Lab Results: Lab Results  Component Value Date   WBC 6.6 04/02/2014   HGB 13.3 04/02/2014   HCT 40.1 04/02/2014   MCV 92.6 04/02/2014   PLT 253 04/02/2014     Chemistry      Component Value Date/Time   NA 139 04/02/2014 1207   NA 135* 11/30/2013 0420   K 4.0 04/02/2014 1207   K 4.4 11/30/2013 0420   CL 95* 11/30/2013 0420   CO2 30* 04/02/2014 1207   CO2 28 11/30/2013 0420   BUN 12.8 04/02/2014 1207   BUN 18 11/30/2013 0420   CREATININE 0.9 04/02/2014 1207   CREATININE 0.80 11/30/2013 0420   CREATININE 0.75 10/29/2011 1545      Component Value Date/Time   CALCIUM 9.4 04/02/2014 1207   CALCIUM 9.0 11/30/2013 0420   ALKPHOS 139 04/02/2014 1207   ALKPHOS 83 12/14/2011 1044   AST 20 04/02/2014 1207   AST 21 12/14/2011 1044   ALT 14 04/02/2014 1207   ALT 25 12/14/2011 1044   BILITOT 0.42 04/02/2014 1207   BILITOT 0.4 12/14/2011 1044     Results for Justin Porter (MRN [id number]) as of 04/06/2014 08:58  Ref. Range 02/14/2014 13:51 04/02/2014 12:08  PSA Latest Range: <=4.00 ng/mL 12.05 (H) 19.35 (H)    Radiological Studies:  EXAM:  NUCLEAR MEDICINE WHOLE BODY BONE SCAN  TECHNIQUE:  Whole body anterior and posterior images were obtained approximately  3 hours after intravenous injection of radiopharmaceutical.  RADIOPHARMACEUTICALS: 25.7 mCi Technetium-99 MDP  COMPARISON: 07/24/2013  FINDINGS:  Progressive areas of increased uptake in the pubic bones and right  sacrum. The long bones are normal except for Joint activity which is  likely degenerative.  IMPRESSION:  Progressive metastatic disease involving the bony pelvis.   Impression and Plan:   80 year old gentleman with the following issues:   1. Castration resistant metastatic prostate cancer with disease to the bone. His initial diagnosis was in 1995 status post prostatectomy and subsequently developed biochemical relapse treated with salvage radiation therapy. There is also  the bone scan as well as the PSA testing was discussed today with the patient and his wife. Options of treatment were discussed including Xtandi, Zytiga, systemic chemotherapy as well as observation. Given the slow changes in his PSA and clinical status it is reasonable to give him an interval of observation and institute therapy with a rapid PSA progression. I think this can happen fairly soon to would be reasonable to initiate treatment in the near future. But also would be reasonable to give him a period of rehabilitation from his recent operation. We will repeat his PSA testing in about 2 months and we'll make a decision at that time. I think he would be an excellent candidate for Xtandi at that time.  2. Androgen depravation: Have recommended he continues Lupron which he has been receiving every 6 months.   3. Bone directed therapy: I agree with Dr. Lynne Logan assessment that he would be a candidate for Xgevae once he obtains dental clearance.  [facility], MD 9/18/20159:09 AM

## 2014-04-06 NOTE — Telephone Encounter (Signed)
Pt confirmed labs/ov per 09/18 POF, gave pt AVS....KJ °

## 2014-04-11 DIAGNOSIS — M25569 Pain in unspecified knee: Secondary | ICD-10-CM | POA: Diagnosis not present

## 2014-04-11 DIAGNOSIS — M1710 Unilateral primary osteoarthritis, unspecified knee: Secondary | ICD-10-CM | POA: Diagnosis not present

## 2014-04-26 DIAGNOSIS — I10 Essential (primary) hypertension: Secondary | ICD-10-CM | POA: Diagnosis not present

## 2014-04-26 DIAGNOSIS — Z01818 Encounter for other preprocedural examination: Secondary | ICD-10-CM | POA: Diagnosis not present

## 2014-04-26 DIAGNOSIS — M1711 Unilateral primary osteoarthritis, right knee: Secondary | ICD-10-CM | POA: Diagnosis not present

## 2014-04-26 DIAGNOSIS — M25561 Pain in right knee: Secondary | ICD-10-CM | POA: Diagnosis not present

## 2014-04-26 DIAGNOSIS — Z23 Encounter for immunization: Secondary | ICD-10-CM | POA: Diagnosis not present

## 2014-05-09 ENCOUNTER — Encounter (HOSPITAL_COMMUNITY): Payer: Self-pay | Admitting: Pharmacy Technician

## 2014-05-16 ENCOUNTER — Encounter (HOSPITAL_COMMUNITY): Payer: Self-pay

## 2014-05-16 ENCOUNTER — Encounter (HOSPITAL_COMMUNITY)
Admission: RE | Admit: 2014-05-16 | Discharge: 2014-05-16 | Disposition: A | Discharge status: Home / Self Care | Payer: Medicare Other | Source: Ambulatory Visit | Attending: Orthopedic Surgery | Admitting: Orthopedic Surgery

## 2014-05-16 DIAGNOSIS — Z01812 Encounter for preprocedural laboratory examination: Secondary | ICD-10-CM | POA: Insufficient documentation

## 2014-05-16 HISTORY — DX: Frequency of micturition: R35.0

## 2014-05-16 LAB — URINALYSIS, ROUTINE W REFLEX MICROSCOPIC
Bilirubin Urine: NEGATIVE
GLUCOSE, UA: NEGATIVE mg/dL
Ketones, ur: NEGATIVE mg/dL
Leukocytes, UA: NEGATIVE
Nitrite: NEGATIVE
PROTEIN: NEGATIVE mg/dL
Specific Gravity, Urine: 1.009 (ref 1.005–1.030)
Urobilinogen, UA: 0.2 mg/dL (ref 0.0–1.0)
pH: 6 (ref 5.0–8.0)

## 2014-05-16 LAB — CBC
HCT: 39.7 % (ref 39.0–52.0)
Hemoglobin: 13 g/dL (ref 13.0–17.0)
MCH: 30.5 pg (ref 26.0–34.0)
MCHC: 32.7 g/dL (ref 30.0–36.0)
MCV: 93.2 fL (ref 78.0–100.0)
PLATELETS: 223 10*3/uL (ref 150–400)
RBC: 4.26 MIL/uL (ref 4.22–5.81)
RDW: 13.4 % (ref 11.5–15.5)
WBC: 7.2 10*3/uL (ref 4.0–10.5)

## 2014-05-16 LAB — SURGICAL PCR SCREEN
MRSA, PCR: NEGATIVE
Staphylococcus aureus: NEGATIVE

## 2014-05-16 LAB — PROTIME-INR
INR: 1.09 (ref 0.00–1.49)
Prothrombin Time: 14.3 seconds (ref 11.6–15.2)

## 2014-05-16 LAB — BASIC METABOLIC PANEL
ANION GAP: 9 (ref 5–15)
BUN: 13 mg/dL (ref 6–23)
CALCIUM: 9.4 mg/dL (ref 8.4–10.5)
CO2: 31 meq/L (ref 19–32)
Chloride: 94 mEq/L — ABNORMAL LOW (ref 96–112)
Creatinine, Ser: 0.86 mg/dL (ref 0.50–1.35)
GFR calc Af Amer: >90 mL/min (ref >90)
GFR calc non Af Amer: 78 mL/min — ABNORMAL LOW (ref >90)
Glucose, Bld: 98 mg/dL (ref 70–99)
POTASSIUM: 4.5 meq/L (ref 3.7–5.3)
SODIUM: 134 meq/L — AB (ref 137–147)

## 2014-05-16 LAB — URINE MICROSCOPIC-ADD ON

## 2014-05-16 LAB — APTT: APTT: 33 s (ref 24–37)

## 2014-05-16 NOTE — Patient Instructions (Signed)
Justin Porter  05/16/2014   Your procedure is scheduled on:  05/21/2014     Come thru the [facility]   Follow the Signs to [facility] at 0630       am  Call this number if you have problems the morning of surgery: [phone]   Remember:   Do not eat food or drink liquids after midnight.   Take these medicines the morning of surgery with A SIP OF WATER: none    Do not wear jewelry,   Do not wear lotions, powders, or perfumes.  deodorant.  . Men may shave face and neck.  Do not bring valuables to the hospital.  Contacts, dentures or bridgework may not be worn into surgery.           Please read over the following fact sheets that you were given:, coughing and deep breathing exercises, leg exercises            [place] [facility] - Preparing for Surgery Before surgery, you can play an important role.  Because skin is not sterile, your skin needs to be as free of germs as possible.  You can reduce the number of germs on your skin by washing with CHG (chlorahexidine gluconate) soap before surgery.  CHG is an antiseptic cleaner which kills germs and bonds with the skin to continue killing germs even after washing. Please DO NOT use if you have an allergy to CHG or antibacterial soaps.  If your skin becomes reddened/irritated stop using the CHG and inform your nurse when you arrive at Short Stay. Do not shave (including legs and underarms) for at least 48 hours prior to the first CHG shower.  You may shave your face/neck. Please follow these instructions carefully:  1.  Shower with CHG Soap the night before surgery and the  morning of Surgery.  2.  If you choose to wash your hair, wash your hair first as usual with your  normal  shampoo.  3.  After you shampoo, rinse your hair and body thoroughly to remove the  shampoo.                           4.  Use CHG as you would any other liquid soap.  You can apply chg directly  to the skin and wash                       Gently with a  scrungie or clean washcloth.  5.  Apply the CHG Soap to your body ONLY FROM THE NECK DOWN.   Do not use on face/ open                           Wound or open sores. Avoid contact with eyes, ears mouth and genitals (private parts).                       Wash face,  Genitals (private parts) with your normal soap.             6.  Wash thoroughly, paying special attention to the area where your surgery  will be performed.  7.  Thoroughly rinse your body with warm water from the neck down.  8.  DO NOT shower/wash with your normal soap after using and rinsing off  the CHG Soap.  9.  Pat yourself dry with a clean towel.            10.  Wear clean pajamas.            11.  Place clean sheets on your bed the night of your first shower and do not  sleep with pets. Day of Surgery : Do not apply any lotions/deodorants the morning of surgery.  Please wear clean clothes to the hospital/surgery center.  FAILURE TO FOLLOW THESE INSTRUCTIONS MAY RESULT IN THE CANCELLATION OF YOUR SURGERY PATIENT SIGNATURE_________________________________  NURSE SIGNATURE__________________________________  ________________________________________________________________________  WHAT IS A BLOOD TRANSFUSION? Blood Transfusion Information  A transfusion is the replacement of blood or some of its parts. Blood is made up of multiple cells which provide different functions.  Red blood cells carry oxygen and are used for blood loss replacement.  White blood cells fight against infection.  Platelets control bleeding.  Plasma helps clot blood.  Other blood products are available for specialized needs, such as hemophilia or other clotting disorders. BEFORE THE TRANSFUSION  Who gives blood for transfusions?   Healthy volunteers who are fully evaluated to make sure their blood is safe. This is blood bank blood. Transfusion therapy is the safest it has ever been in the practice of medicine. Before blood is taken  from a donor, a complete history is taken to make sure that person has no history of diseases nor engages in risky social behavior (examples are intravenous drug use or sexual activity with multiple partners). The donor's travel history is screened to minimize risk of transmitting infections, such as malaria. The donated blood is tested for signs of infectious diseases, such as HIV and hepatitis. The blood is then tested to be sure it is compatible with you in order to minimize the chance of a transfusion reaction. If you or a relative donates blood, this is often done in anticipation of surgery and is not appropriate for emergency situations. It takes many days to process the donated blood. RISKS AND COMPLICATIONS Although transfusion therapy is very safe and saves many lives, the main dangers of transfusion include:   Getting an infectious disease.  Developing a transfusion reaction. This is an allergic reaction to something in the blood you were given. Every precaution is taken to prevent this. The decision to have a blood transfusion has been considered carefully by your caregiver before blood is given. Blood is not given unless the benefits outweigh the risks. AFTER THE TRANSFUSION  Right after receiving a blood transfusion, you will usually feel much better and more energetic. This is especially true if your red blood cells have gotten low (anemic). The transfusion raises the level of the red blood cells which carry oxygen, and this usually causes an energy increase.  The nurse administering the transfusion will monitor you carefully for complications. HOME CARE INSTRUCTIONS  No special instructions are needed after a transfusion. You may find your energy is better. Speak with your caregiver about any limitations on activity for underlying diseases you may have. SEEK MEDICAL CARE IF:   Your condition is not improving after your transfusion.  You develop redness or irritation at the  intravenous (IV) site. SEEK IMMEDIATE MEDICAL CARE IF:  Any of the following symptoms occur over the next 12 hours:  Shaking chills.  You have a temperature by mouth above 102 F (38.9 C), not controlled by medicine.  Chest, back, or muscle pain.  People around you feel you are not acting correctly or  are confused.  Shortness of breath or difficulty breathing.  Dizziness and fainting.  You get a rash or develop hives.  You have a decrease in urine output.  Your urine turns a dark color or changes to pink, red, or brown. Any of the following symptoms occur over the next 10 days:  You have a temperature by mouth above 102 F (38.9 C), not controlled by medicine.  Shortness of breath.  Weakness after normal activity.  The white part of the eye turns yellow (jaundice).  You have a decrease in the amount of urine or are urinating less often.  Your urine turns a dark color or changes to pink, red, or brown. Document Released: 07/03/2000 Document Revised: 09/28/2011 Document Reviewed: 02/20/2008 ExitCare Patient Information 2014 [place].  _______________________________________________________________________  Incentive Spirometer  An incentive spirometer is a tool that can help keep your lungs clear and active. This tool measures how well you are filling your lungs with each breath. Taking long deep breaths may help reverse or decrease the chance of developing breathing (pulmonary) problems (especially infection) following:  A long period of time when you are unable to move or be active. BEFORE THE PROCEDURE   If the spirometer includes an indicator to show your best effort, your nurse or respiratory therapist will set it to a desired goal.  If possible, sit up straight or lean slightly forward. Try not to slouch.  Hold the incentive spirometer in an upright position. INSTRUCTIONS FOR USE  1. Sit on the edge of your bed if possible, or sit up as far as you can  in bed or on a chair. 2. Hold the incentive spirometer in an upright position. 3. Breathe out normally. 4. Place the mouthpiece in your mouth and seal your lips tightly around it. 5. Breathe in slowly and as deeply as possible, raising the piston or the ball toward the top of the column. 6. Hold your breath for 3-5 seconds or for as long as possible. Allow the piston or ball to fall to the bottom of the column. 7. Remove the mouthpiece from your mouth and breathe out normally. 8. Rest for a few seconds and repeat Steps 1 through 7 at least 10 times every 1-2 hours when you are awake. Take your time and take a few normal breaths between deep breaths. 9. The spirometer may include an indicator to show your best effort. Use the indicator as a goal to work toward during each repetition. 10. After each set of 10 deep breaths, practice coughing to be sure your lungs are clear. If you have an incision (the cut made at the time of surgery), support your incision when coughing by placing a pillow or rolled up towels firmly against it. Once you are able to get out of bed, walk around indoors and cough well. You may stop using the incentive spirometer when instructed by your caregiver.  RISKS AND COMPLICATIONS  Take your time so you do not get dizzy or light-headed.  If you are in pain, you may need to take or ask for pain medication before doing incentive spirometry. It is harder to take a deep breath if you are having pain. AFTER USE  Rest and breathe slowly and easily.  It can be helpful to keep track of a log of your progress. Your caregiver can provide you with a simple table to help with this. If you are using the spirometer at home, follow these instructions: [place] IF:  You are having difficultly using the spirometer.  You have trouble using the spirometer as often as instructed.  Your pain medication is not giving enough relief while using the spirometer.  You develop fever of  100.5 F (38.1 C) or higher. SEEK IMMEDIATE MEDICAL CARE IF:   You cough up bloody sputum that had not been present before.  You develop fever of 102 F (38.9 C) or greater.  You develop worsening pain at or near the incision site. MAKE SURE YOU:   Understand these instructions.  Will watch your condition.  Will get help right away if you are not doing well or get worse. Document Released: 11/16/2006 Document Revised: 09/28/2011 Document Reviewed: 01/17/2007 [facility] Patient Information 2014 [place], [place].   ________________________________________________________________________

## 2014-05-16 NOTE — Progress Notes (Signed)
U/A with micro results faxed via EPIC to Dr Alvan Dame.

## 2014-05-16 NOTE — Progress Notes (Signed)
CXR results from 11/14/2013 faxed to Dr Alvan Dame via [facility].

## 2014-05-16 NOTE — Progress Notes (Signed)
Clearance Dr Lysle Rubens on chart - 04/26/2014   EKG- 04/26/2014 on chart  Dr Lysle Rubens LOV note on chart- 04/26/2014   CXR 11/14/13 EPIC

## 2014-05-20 NOTE — H&P (Signed)
TOTAL KNEE ADMISSION H&P  Patient is being admitted for right total knee arthroplasty.  Subjective:  Chief Complaint:    Right knee primary OA / pain.  HPI: Justin Porter, 80 y.o. male, has a history of pain and functional disability in the right knee due to arthritis and has failed non-surgical conservative treatments for greater than 12 weeks to includeNSAID's and/or analgesics, corticosteriod injections, use of assistive devices and activity modification.  Onset of symptoms was gradual, starting 3+ years ago with gradually worsening course since that time. The patient noted prior procedures on the knee to include  arthroplasty on the left knee 2.5 years ago.  Patient currently rates pain in the right knee(s) at 9 out of 10 with activity. Patient has night pain, worsening of pain with activity and weight bearing, pain that interferes with activities of daily living, pain with passive range of motion, crepitus and joint swelling.  Patient has evidence of periarticular osteophytes and joint space narrowing by imaging studies.  There is no active infection.  Risks, benefits and expectations were discussed with the patient.  Risks including but not limited to the risk of anesthesia, blood clots, nerve damage, blood vessel damage, failure of the prosthesis, infection and up to and including death.  Patient understand the risks, benefits and expectations and wishes to proceed with surgery.   PCP: [name], MD  D/C Plans:      SNF  Post-op Meds:       No Rx given  Tranexamic Acid:      To be given - IV    Decadron:      Is to be given  FYI:     ASA post-op  Norco post-op    Patient Active Problem List   Diagnosis Date Noted  . Malignant neoplasm of prostate 02/14/2014  . Expected blood loss anemia 11/29/2013  . S/P left THA, AA 11/28/2013  . Right inguinal hernia 01/01/2012  . Ruptured appendicitis 11/09/2011  . History of prostate cancer 09/15/2011  . History of lung cancer  09/15/2011  . HTN (hypertension) 09/15/2011   Past Medical History  Diagnosis Date  . Arthritis   . Hyperlipidemia   . Hypertension   . Hearing loss   . Radiation 18 yrs ago    for increased psa  . History of prostate cancer   . Urinary frequency   . Cancer     lung - had 1/3 of left lung removed, did not require additional treatment    Past Surgical History  Procedure Laterality Date  . Elbow surgery      left  surgery for fx 3 yrs ago, right done 40 yrs ago  . Lobectomy  2005  . Prostate surgery  1995  . Lung biopsy  2005    resulted in 1/3 of left lung being removed  . Back surgery  4-5 yrs ago    lower back  . Laparoscopic appendectomy  10/15/2011    Procedure: APPENDECTOMY LAPAROSCOPIC;  Surgeon: Stark Klein, MD;  Location: [facility] ORS;  Service: General;  Laterality: N/A;  . Appendectomy  2013  . Hernia repair  01-14-12    2003-left  . Inguinal hernia repair  01/22/2012    Procedure: HERNIA REPAIR INGUINAL ADULT;  Surgeon: Adin Hector, MD;  Location: [facility] ORS;  Service: General;  Laterality: Right;  . Joint replacement  2012    L TOTAL KNEE  . Total hip arthroplasty Left 11/28/2013    Procedure: LEFT TOTAL HIP ARTHROPLASTY ANTERIOR  APPROACH;  Surgeon: [name], MD;  Location: [facility] ORS;  Service: Orthopedics;  Laterality: Left;    No prescriptions prior to admission   Allergies  Allergen Reactions  . Codeine Nausea And Vomiting  . Shrimp [Shellfish Allergy] Nausea And Vomiting  . Betadine [Povidone Iodine] Rash    History  Substance Use Topics  . Smoking status: Never Smoker   . Smokeless tobacco: Never Used  . Alcohol Use: No       Review of Systems  Constitutional: Negative.   HENT: Positive for hearing loss.   Eyes: Negative.   Respiratory: Negative.   Cardiovascular: Negative.   Gastrointestinal: Negative.   Genitourinary: Positive for frequency.  Musculoskeletal: Positive for back pain and joint pain.  Skin: Negative.   Neurological: Negative.    Endo/Heme/Allergies: Negative.   Psychiatric/Behavioral: Negative.     Objective:  Physical Exam  Constitutional: He is oriented to person, place, and time. He appears well-developed and well-nourished.  HENT:  Head: Normocephalic and atraumatic.  Eyes: Pupils are equal, round, and reactive to light.  Neck: Neck supple. No JVD present. No tracheal deviation present. No thyromegaly present.  Cardiovascular: Normal rate, regular rhythm, normal heart sounds and intact distal pulses.   Respiratory: Effort normal and breath sounds normal. No stridor.  GI: Soft. There is no tenderness. There is no guarding.  Musculoskeletal:       Right knee: He exhibits decreased range of motion and bony tenderness. He exhibits no ecchymosis, no deformity, no laceration and no erythema. Tenderness found.  Lymphadenopathy:    He has no cervical adenopathy.  Neurological: He is alert and oriented to person, place, and time.  Skin: Skin is warm and dry.  Psychiatric: He has a normal mood and affect.     Labs:  Estimated body mass index is 24.39 kg/(m^2) as calculated from the following:   Height as of 04/06/14: 6' (1.829 m).   Weight as of 04/06/14: 81.602 kg (179 lb 14.4 oz).   Imaging Review Plain radiographs demonstrate severe degenerative joint disease of the right knee(s). The overall alignment is neutral. The bone quality appears to be good for age and reported activity level.  Assessment/Plan:  End stage arthritis, right knee   The patient history, physical examination, clinical judgment of the provider and imaging studies are consistent with end stage degenerative joint disease of the right knee(s) and total knee arthroplasty is deemed medically necessary. The treatment options including medical management, injection therapy arthroscopy and arthroplasty were discussed at length. The risks and benefits of total knee arthroplasty were presented and reviewed. The risks due to aseptic loosening,  infection, stiffness, patella tracking problems, thromboembolic complications and other imponderables were discussed. The patient acknowledged the explanation, agreed to proceed with the plan and consent was signed. Patient is being admitted for inpatient treatment for surgery, pain control, PT, OT, prophylactic antibiotics, VTE prophylaxis, progressive ambulation and ADL's and discharge planning. The patient is planning to be discharged to skilled nursing facility.    [place]   PA-C  05/20/2014, 5:30 PM

## 2014-05-21 ENCOUNTER — Inpatient Hospital Stay (HOSPITAL_COMMUNITY): Payer: Medicare Other | Admitting: Anesthesiology

## 2014-05-21 ENCOUNTER — Encounter (HOSPITAL_COMMUNITY)
Admission: RE | Disposition: A | Discharge status: Skilled Nursing Facility | Payer: Self-pay | Source: Ambulatory Visit | Attending: Orthopedic Surgery

## 2014-05-21 ENCOUNTER — Encounter (HOSPITAL_COMMUNITY): Payer: Self-pay | Admitting: *Deleted

## 2014-05-21 ENCOUNTER — Inpatient Hospital Stay (HOSPITAL_COMMUNITY)
Admission: RE | Admit: 2014-05-21 | Discharge: 2014-05-23 | DRG: 470 | Disposition: A | Discharge status: Skilled Nursing Facility | Payer: Medicare Other | Source: Ambulatory Visit | Attending: Orthopedic Surgery | Admitting: Orthopedic Surgery

## 2014-05-21 DIAGNOSIS — Z01812 Encounter for preprocedural laboratory examination: Secondary | ICD-10-CM | POA: Diagnosis not present

## 2014-05-21 DIAGNOSIS — D500 Iron deficiency anemia secondary to blood loss (chronic): Secondary | ICD-10-CM | POA: Diagnosis not present

## 2014-05-21 DIAGNOSIS — Z85118 Personal history of other malignant neoplasm of bronchus and lung: Secondary | ICD-10-CM | POA: Diagnosis not present

## 2014-05-21 DIAGNOSIS — Z96659 Presence of unspecified artificial knee joint: Secondary | ICD-10-CM

## 2014-05-21 DIAGNOSIS — E785 Hyperlipidemia, unspecified: Secondary | ICD-10-CM | POA: Diagnosis present

## 2014-05-21 DIAGNOSIS — Z8546 Personal history of malignant neoplasm of prostate: Secondary | ICD-10-CM

## 2014-05-21 DIAGNOSIS — H9190 Unspecified hearing loss, unspecified ear: Secondary | ICD-10-CM | POA: Diagnosis present

## 2014-05-21 DIAGNOSIS — Z96651 Presence of right artificial knee joint: Secondary | ICD-10-CM

## 2014-05-21 DIAGNOSIS — M179 Osteoarthritis of knee, unspecified: Secondary | ICD-10-CM | POA: Diagnosis not present

## 2014-05-21 DIAGNOSIS — Z471 Aftercare following joint replacement surgery: Secondary | ICD-10-CM | POA: Diagnosis not present

## 2014-05-21 DIAGNOSIS — M1711 Unilateral primary osteoarthritis, right knee: Secondary | ICD-10-CM | POA: Diagnosis not present

## 2014-05-21 DIAGNOSIS — I10 Essential (primary) hypertension: Secondary | ICD-10-CM | POA: Diagnosis present

## 2014-05-21 DIAGNOSIS — D62 Acute posthemorrhagic anemia: Secondary | ICD-10-CM | POA: Diagnosis not present

## 2014-05-21 DIAGNOSIS — R350 Frequency of micturition: Secondary | ICD-10-CM | POA: Diagnosis not present

## 2014-05-21 DIAGNOSIS — M25561 Pain in right knee: Secondary | ICD-10-CM | POA: Diagnosis not present

## 2014-05-21 HISTORY — PX: TOTAL KNEE ARTHROPLASTY: SHX125

## 2014-05-21 LAB — TYPE AND SCREEN
ABO/RH(D): A POS
ANTIBODY SCREEN: NEGATIVE

## 2014-05-21 SURGERY — ARTHROPLASTY, KNEE, TOTAL
Anesthesia: Monitor Anesthesia Care | Site: Knee | Laterality: Right

## 2014-05-21 MED ORDER — HYDROMORPHONE HCL 1 MG/ML IJ SOLN
INTRAMUSCULAR | Status: AC
  Filled 2014-05-21: qty 1

## 2014-05-21 MED ORDER — HYDROMORPHONE HCL 1 MG/ML IJ SOLN
0.2500 mg | INTRAMUSCULAR | Status: DC | PRN
  Administered 2014-05-21 (×4): 0.5 mg via INTRAVENOUS

## 2014-05-21 MED ORDER — DEXAMETHASONE SODIUM PHOSPHATE 10 MG/ML IJ SOLN
INTRAMUSCULAR | Status: DC | PRN
  Administered 2014-05-21: 10 mg via INTRAVENOUS

## 2014-05-21 MED ORDER — PROPOFOL INFUSION 10 MG/ML OPTIME
INTRAVENOUS | Status: DC | PRN
  Administered 2014-05-21: 50 ug/kg/min via INTRAVENOUS

## 2014-05-21 MED ORDER — PROPOFOL 10 MG/ML IV BOLUS
INTRAVENOUS | Status: AC
Start: 2014-05-21 — End: 2014-05-21
  Filled 2014-05-21: qty 20

## 2014-05-21 MED ORDER — LACTATED RINGERS IV SOLN
INTRAVENOUS | Status: DC | PRN
  Administered 2014-05-21 (×3): via INTRAVENOUS

## 2014-05-21 MED ORDER — CEFAZOLIN SODIUM-DEXTROSE 2-3 GM-% IV SOLR
INTRAVENOUS | Status: AC
  Filled 2014-05-21: qty 50

## 2014-05-21 MED ORDER — ONDANSETRON HCL 4 MG/2ML IJ SOLN
INTRAMUSCULAR | Status: AC
  Filled 2014-05-21: qty 2

## 2014-05-21 MED ORDER — CEFAZOLIN SODIUM-DEXTROSE 2-3 GM-% IV SOLR
2.0000 g | Freq: Four times a day (QID) | INTRAVENOUS | Status: AC
  Administered 2014-05-21 (×2): 2 g via INTRAVENOUS
  Filled 2014-05-21 (×2): qty 50

## 2014-05-21 MED ORDER — SODIUM CHLORIDE 0.9 % IJ SOLN
INTRAMUSCULAR | Status: DC | PRN
  Administered 2014-05-21: 30 mL via INTRAVENOUS

## 2014-05-21 MED ORDER — KETOROLAC TROMETHAMINE 30 MG/ML IJ SOLN
INTRAMUSCULAR | Status: DC | PRN
  Administered 2014-05-21: 30 mg via INTRAVENOUS

## 2014-05-21 MED ORDER — FENTANYL CITRATE 0.05 MG/ML IJ SOLN
INTRAMUSCULAR | Status: AC
  Filled 2014-05-21: qty 2

## 2014-05-21 MED ORDER — BUPIVACAINE IN DEXTROSE 0.75-8.25 % IT SOLN
INTRATHECAL | Status: DC | PRN
  Administered 2014-05-21: 2 mL via INTRATHECAL

## 2014-05-21 MED ORDER — METHOCARBAMOL 1000 MG/10ML IJ SOLN
500.0000 mg | Freq: Four times a day (QID) | INTRAVENOUS | Status: DC | PRN
  Administered 2014-05-21: 500 mg via INTRAVENOUS
  Filled 2014-05-21 (×2): qty 5

## 2014-05-21 MED ORDER — MAGNESIUM CITRATE PO SOLN: 1.0000 | Freq: Once | ORAL | Status: AC | PRN

## 2014-05-21 MED ORDER — FUROSEMIDE 20 MG PO TABS
20.0000 mg | ORAL_TABLET | Freq: Two times a day (BID) | ORAL | Status: DC
  Administered 2014-05-21 – 2014-05-23 (×4): 20 mg via ORAL
  Filled 2014-05-21 (×6): qty 1

## 2014-05-21 MED ORDER — METOCLOPRAMIDE HCL 10 MG PO TABS
5.0000 mg | ORAL_TABLET | Freq: Three times a day (TID) | ORAL | Status: DC | PRN
Start: 2014-05-21 — End: 2014-05-23

## 2014-05-21 MED ORDER — PROPOFOL 10 MG/ML IV BOLUS
INTRAVENOUS | Status: AC
  Filled 2014-05-21: qty 20

## 2014-05-21 MED ORDER — BUPIVACAINE-EPINEPHRINE (PF) 0.25% -1:200000 IJ SOLN
INTRAMUSCULAR | Status: AC
  Filled 2014-05-21: qty 30

## 2014-05-21 MED ORDER — SODIUM CHLORIDE 0.9 % IR SOLN
Status: DC | PRN
  Administered 2014-05-21: 1000 mL

## 2014-05-21 MED ORDER — LACTATED RINGERS IV SOLN: INTRAVENOUS | Status: DC

## 2014-05-21 MED ORDER — CELECOXIB 200 MG PO CAPS
200.0000 mg | ORAL_CAPSULE | Freq: Two times a day (BID) | ORAL | Status: DC
  Administered 2014-05-21 – 2014-05-23 (×4): 200 mg via ORAL
  Filled 2014-05-21 (×5): qty 1

## 2014-05-21 MED ORDER — HYDROCODONE-ACETAMINOPHEN 7.5-325 MG PO TABS
1.0000 | ORAL_TABLET | ORAL | Status: DC
  Administered 2014-05-21 – 2014-05-23 (×8): 1 via ORAL
  Filled 2014-05-21: qty 2
  Filled 2014-05-21 (×7): qty 1

## 2014-05-21 MED ORDER — HYDROMORPHONE HCL 1 MG/ML IJ SOLN
0.5000 mg | INTRAMUSCULAR | Status: DC | PRN
  Administered 2014-05-21: 1 mg via INTRAVENOUS
  Filled 2014-05-21: qty 1

## 2014-05-21 MED ORDER — KETOROLAC TROMETHAMINE 30 MG/ML IJ SOLN
INTRAMUSCULAR | Status: AC
  Filled 2014-05-21: qty 1

## 2014-05-21 MED ORDER — DIPHENHYDRAMINE HCL 25 MG PO CAPS: 25.0000 mg | ORAL_CAPSULE | Freq: Four times a day (QID) | ORAL | Status: DC | PRN

## 2014-05-21 MED ORDER — TRANEXAMIC ACID 100 MG/ML IV SOLN
1000.0000 mg | Freq: Once | INTRAVENOUS | Status: AC
  Administered 2014-05-21: 1000 mg via INTRAVENOUS
  Filled 2014-05-21: qty 10

## 2014-05-21 MED ORDER — DEXAMETHASONE SODIUM PHOSPHATE 10 MG/ML IJ SOLN
10.0000 mg | Freq: Once | INTRAMUSCULAR | Status: AC
  Administered 2014-05-22: 10 mg via INTRAVENOUS
  Filled 2014-05-21: qty 1

## 2014-05-21 MED ORDER — ALUM & MAG HYDROXIDE-SIMETH 200-200-20 MG/5ML PO SUSP: 30.0000 mL | ORAL | Status: DC | PRN

## 2014-05-21 MED ORDER — DEXAMETHASONE SODIUM PHOSPHATE 10 MG/ML IJ SOLN
INTRAMUSCULAR | Status: AC
  Filled 2014-05-21: qty 1

## 2014-05-21 MED ORDER — ONDANSETRON HCL 4 MG/2ML IJ SOLN
INTRAMUSCULAR | Status: DC | PRN
  Administered 2014-05-21: 4 mg via INTRAVENOUS

## 2014-05-21 MED ORDER — CEFAZOLIN SODIUM-DEXTROSE 2-3 GM-% IV SOLR
2.0000 g | INTRAVENOUS | Status: AC
  Administered 2014-05-21: 2 g via INTRAVENOUS

## 2014-05-21 MED ORDER — POLYETHYLENE GLYCOL 3350 17 G PO PACK
17.0000 g | PACK | Freq: Two times a day (BID) | ORAL | Status: DC
  Administered 2014-05-21 – 2014-05-23 (×3): 17 g via ORAL

## 2014-05-21 MED ORDER — PROPOFOL 10 MG/ML IV BOLUS
INTRAVENOUS | Status: DC | PRN
  Administered 2014-05-21 (×3): 20 mg via INTRAVENOUS
  Administered 2014-05-21: 10 mg via INTRAVENOUS
  Administered 2014-05-21: 20 mg via INTRAVENOUS

## 2014-05-21 MED ORDER — OXYCODONE HCL 5 MG PO TABS: 5.0000 mg | ORAL_TABLET | Freq: Once | ORAL | Status: DC | PRN

## 2014-05-21 MED ORDER — MENTHOL 3 MG MT LOZG: 1.0000 | LOZENGE | OROMUCOSAL | Status: DC | PRN

## 2014-05-21 MED ORDER — FERROUS SULFATE 325 (65 FE) MG PO TABS
325.0000 mg | ORAL_TABLET | Freq: Three times a day (TID) | ORAL | Status: DC
  Administered 2014-05-22 – 2014-05-23 (×4): 325 mg via ORAL
  Filled 2014-05-21 (×8): qty 1

## 2014-05-21 MED ORDER — SODIUM CHLORIDE 0.9 % IJ SOLN
INTRAMUSCULAR | Status: AC
  Filled 2014-05-21: qty 50

## 2014-05-21 MED ORDER — METOPROLOL TARTRATE 50 MG PO TABS
50.0000 mg | ORAL_TABLET | Freq: Once | ORAL | Status: AC
  Administered 2014-05-21: 50 mg via ORAL
  Filled 2014-05-21: qty 1

## 2014-05-21 MED ORDER — CHLORHEXIDINE GLUCONATE 4 % EX LIQD: 60.0000 mL | Freq: Once | CUTANEOUS | Status: DC

## 2014-05-21 MED ORDER — METOCLOPRAMIDE HCL 5 MG/ML IJ SOLN: 5.0000 mg | Freq: Three times a day (TID) | INTRAMUSCULAR | Status: DC | PRN

## 2014-05-21 MED ORDER — SIMVASTATIN 20 MG PO TABS
20.0000 mg | ORAL_TABLET | Freq: Every evening | ORAL | Status: DC
  Administered 2014-05-22: 20 mg via ORAL
  Filled 2014-05-21 (×3): qty 1

## 2014-05-21 MED ORDER — BUPIVACAINE-EPINEPHRINE (PF) 0.25% -1:200000 IJ SOLN
INTRAMUSCULAR | Status: DC | PRN
  Administered 2014-05-21: 30 mL

## 2014-05-21 MED ORDER — DOCUSATE SODIUM 100 MG PO CAPS
100.0000 mg | ORAL_CAPSULE | Freq: Two times a day (BID) | ORAL | Status: DC
  Administered 2014-05-21 – 2014-05-23 (×3): 100 mg via ORAL

## 2014-05-21 MED ORDER — SODIUM CHLORIDE 0.9 % IV SOLN
INTRAVENOUS | Status: DC
  Administered 2014-05-21 – 2014-05-22 (×2): via INTRAVENOUS
  Filled 2014-05-21 (×11): qty 1000

## 2014-05-21 MED ORDER — PHENOL 1.4 % MT LIQD: 1.0000 | OROMUCOSAL | Status: DC | PRN

## 2014-05-21 MED ORDER — ONDANSETRON HCL 4 MG/2ML IJ SOLN
4.0000 mg | Freq: Four times a day (QID) | INTRAMUSCULAR | Status: DC | PRN
  Administered 2014-05-21: 4 mg via INTRAVENOUS
  Filled 2014-05-21: qty 2

## 2014-05-21 MED ORDER — ASPIRIN EC 325 MG PO TBEC
325.0000 mg | DELAYED_RELEASE_TABLET | Freq: Two times a day (BID) | ORAL | Status: DC
  Administered 2014-05-22 – 2014-05-23 (×3): 325 mg via ORAL
  Filled 2014-05-21 (×5): qty 1

## 2014-05-21 MED ORDER — ONDANSETRON HCL 4 MG PO TABS: 4.0000 mg | ORAL_TABLET | Freq: Four times a day (QID) | ORAL | Status: DC | PRN

## 2014-05-21 MED ORDER — FENTANYL CITRATE 0.05 MG/ML IJ SOLN
INTRAMUSCULAR | Status: DC | PRN
  Administered 2014-05-21: 50 ug via INTRAVENOUS

## 2014-05-21 MED ORDER — LACTATED RINGERS IV SOLN
INTRAVENOUS | Status: DC
  Administered 2014-05-21: 1000 mL via INTRAVENOUS

## 2014-05-21 MED ORDER — METOPROLOL TARTRATE 1 MG/ML IV SOLN
5.0000 mg | Freq: Once | INTRAVENOUS | Status: AC
  Administered 2014-05-21: 5 mg via INTRAVENOUS
  Filled 2014-05-21 (×2): qty 5

## 2014-05-21 MED ORDER — METHOCARBAMOL 500 MG PO TABS
500.0000 mg | ORAL_TABLET | Freq: Four times a day (QID) | ORAL | Status: DC | PRN
  Administered 2014-05-22: 500 mg via ORAL
  Filled 2014-05-21: qty 1

## 2014-05-21 MED ORDER — BISACODYL 10 MG RE SUPP: 10.0000 mg | Freq: Every day | RECTAL | Status: DC | PRN

## 2014-05-21 MED ORDER — DEXAMETHASONE SODIUM PHOSPHATE 10 MG/ML IJ SOLN: 10.0000 mg | Freq: Once | INTRAMUSCULAR | Status: DC

## 2014-05-21 MED ORDER — OXYCODONE HCL 5 MG/5ML PO SOLN: 5.0000 mg | Freq: Once | ORAL | Status: DC | PRN

## 2014-05-21 MED ORDER — 0.9 % SODIUM CHLORIDE (POUR BTL) OPTIME
TOPICAL | Status: DC | PRN
  Administered 2014-05-21: 1000 mL

## 2014-05-21 SURGICAL SUPPLY — 55 items
ADH SKN CLS APL DERMABOND .7 (GAUZE/BANDAGES/DRESSINGS) ×1
BAG SPEC THK2 15X12 ZIP CLS (MISCELLANEOUS)
BAG ZIPLOCK 12X15 (MISCELLANEOUS) IMPLANT
BANDAGE ELASTIC 6 VELCRO ST LF (GAUZE/BANDAGES/DRESSINGS) ×3 IMPLANT
BANDAGE ESMARK 6X9 LF (GAUZE/BANDAGES/DRESSINGS) ×1 IMPLANT
BLADE SAW SGTL 13.0X1.19X90.0M (BLADE) ×3 IMPLANT
BNDG CMPR 9X6 STRL LF SNTH (GAUZE/BANDAGES/DRESSINGS) ×1
BNDG ESMARK 6X9 LF (GAUZE/BANDAGES/DRESSINGS) ×3
BOWL SMART MIX CTS (DISPOSABLE) ×3 IMPLANT
CAPT RP KNEE ×2 IMPLANT
CEMENT HV SMART SET (Cement) ×4 IMPLANT
CHLORAPREP W/TINT 26ML (MISCELLANEOUS) ×4 IMPLANT
CUFF TOURN SGL QUICK 34 (TOURNIQUET CUFF) ×3
CUFF TRNQT CYL 34X4X40X1 (TOURNIQUET CUFF) ×1 IMPLANT
DECANTER SPIKE VIAL GLASS SM (MISCELLANEOUS) ×3 IMPLANT
DERMABOND ADVANCED (GAUZE/BANDAGES/DRESSINGS) ×2
DERMABOND ADVANCED .7 DNX12 (GAUZE/BANDAGES/DRESSINGS) IMPLANT
DRAPE EXTREMITY TIBURON (DRAPES) ×3 IMPLANT
DRAPE POUCH INSTRU U-SHP 10X18 (DRAPES) ×3 IMPLANT
DRAPE U-SHAPE 47X51 STRL (DRAPES) ×3 IMPLANT
DRSG AQUACEL AG ADV 3.5X10 (GAUZE/BANDAGES/DRESSINGS) ×3 IMPLANT
ELECT REM PT RETURN 9FT ADLT (ELECTROSURGICAL) ×3
ELECTRODE REM PT RTRN 9FT ADLT (ELECTROSURGICAL) ×1 IMPLANT
FACESHIELD WRAPAROUND (MASK) ×12 IMPLANT
FACESHIELD WRAPAROUND OR TEAM (MASK) ×5 IMPLANT
GLOVE BIOGEL PI IND STRL 7.5 (GLOVE) ×1 IMPLANT
GLOVE BIOGEL PI IND STRL 8.5 (GLOVE) ×1 IMPLANT
GLOVE BIOGEL PI INDICATOR 7.5 (GLOVE) ×2
GLOVE BIOGEL PI INDICATOR 8.5 (GLOVE) ×2
GLOVE ECLIPSE 8.0 STRL XLNG CF (GLOVE) ×3 IMPLANT
GLOVE ORTHO TXT STRL SZ7.5 (GLOVE) ×6 IMPLANT
GOWN SPEC L3 XXLG W/TWL (GOWN DISPOSABLE) ×3 IMPLANT
GOWN STRL REUS W/TWL LRG LVL3 (GOWN DISPOSABLE) ×3 IMPLANT
HANDPIECE INTERPULSE COAX TIP (DISPOSABLE) ×3
KIT BASIN OR (CUSTOM PROCEDURE TRAY) ×3 IMPLANT
LIQUID BAND (GAUZE/BANDAGES/DRESSINGS) ×3 IMPLANT
MANIFOLD NEPTUNE II (INSTRUMENTS) ×3 IMPLANT
NDL SAFETY ECLIPSE 18X1.5 (NEEDLE) ×1 IMPLANT
NEEDLE HYPO 18GX1.5 SHARP (NEEDLE) ×3
PACK TOTAL JOINT (CUSTOM PROCEDURE TRAY) ×3 IMPLANT
POSITIONER SURGICAL ARM (MISCELLANEOUS) ×5 IMPLANT
SET HNDPC FAN SPRY TIP SCT (DISPOSABLE) ×1 IMPLANT
SET PAD KNEE POSITIONER (MISCELLANEOUS) ×3 IMPLANT
SUCTION FRAZIER 12FR DISP (SUCTIONS) ×3 IMPLANT
SUT MNCRL AB 4-0 PS2 18 (SUTURE) ×3 IMPLANT
SUT VIC AB 1 CT1 36 (SUTURE) ×3 IMPLANT
SUT VIC AB 2-0 CT1 27 (SUTURE) ×9
SUT VIC AB 2-0 CT1 TAPERPNT 27 (SUTURE) ×3 IMPLANT
SUT VLOC 180 0 24IN GS25 (SUTURE) ×3 IMPLANT
SYR 50ML LL SCALE MARK (SYRINGE) ×3 IMPLANT
TOWEL OR 17X26 10 PK STRL BLUE (TOWEL DISPOSABLE) ×3 IMPLANT
TOWEL OR NON WOVEN STRL DISP B (DISPOSABLE) IMPLANT
TRAY FOLEY METER SIL LF 16FR (CATHETERS) ×2 IMPLANT
WATER STERILE IRR 1500ML POUR (IV SOLUTION) ×3 IMPLANT
WRAP KNEE MAXI GEL POST OP (GAUZE/BANDAGES/DRESSINGS) ×3 IMPLANT

## 2014-05-21 NOTE — Op Note (Signed)
NAME:  Justin Porter                      MEDICAL RECORD NO.:  [id number]                             FACILITY:  [facility]      PHYSICIAN:  Pietro Cassis. Alvan Dame, M.D.  DATE OF BIRTH:  10-12-1928      DATE OF PROCEDURE:  05/21/2014                                     OPERATIVE REPORT         PREOPERATIVE DIAGNOSIS:  Right knee osteoarthritis.      POSTOPERATIVE DIAGNOSIS:  Right knee osteoarthritis.      FINDINGS:  The patient was noted to have complete loss of cartilage and   bone-on-bone arthritis with associated osteophytes in all three compartments of   the knee with a pre-operative flexion contracture, significant synovitis and associated effusion.      PROCEDURE:  Right total knee replacement.      COMPONENTS USED:  DePuy Sigma rotating platform posterior stabilized knee   system, a size 4 femur, 5 tibia, 12.5 mm PS insert, and 41 patellar   button.      SURGEON:  Pietro Cassis. Alvan Dame, M.D.      ASSISTANT:  Danae Orleans, PA-C.      ANESTHESIA:  Spinal.      SPECIMENS:  None.      COMPLICATION:  None.      DRAINS:  One Hemovac.  EBL: <100cc      TOURNIQUET TIME:   Total Tourniquet Time Documented: Thigh (Right) - 38 minutes Total: Thigh (Right) - 38 minutes  .      The patient was stable to the recovery room.      INDICATION FOR PROCEDURE:  Justin Porter is a 80 y.o. male patient of   mine.  The patient had been seen, evaluated, and treated conservatively in the   office with medication, activity modification, and injections.  The patient had   radiographic changes of bone-on-bone arthritis with endplate sclerosis and osteophytes noted.      The patient failed conservative measures including medication, injections, and activity modification, and at this point was ready for more definitive measures.   Based on the radiographic changes and failed conservative measures, the patient   decided to proceed with total knee replacement.  Risks of infection,   DVT,  component failure, need for revision surgery, postop course, and   expectations were all   discussed and reviewed.  Consent was obtained for benefit of pain   relief.      PROCEDURE IN DETAIL:  The patient was brought to the operative theater.   Once adequate anesthesia, preoperative antibiotics, 2 gm of Ancef administered, the patient was positioned supine with the right thigh tourniquet placed.  The  right lower extremity was prepped and draped in sterile fashion.  A time-   out was performed identifying the patient, planned procedure, and   extremity.      The right lower extremity was placed in the [facility] leg holder.  The leg was   exsanguinated, tourniquet elevated to 250 mmHg.  A midline incision was   made followed by median parapatellar arthrotomy.  Following initial   exposure,  attention was first directed to the patella.  Precut   measurement was noted to be 24 mm.  I resected down to 14 mm and used a   41 patellar button to restore patellar height as well as cover the cut   surface.      The lug holes were drilled and a metal shim was placed to protect the   patella from retractors and saw blades.      At this point, attention was now directed to the femur.  The femoral   canal was opened with a drill, irrigated to try to prevent fat emboli.  An   intramedullary rod was passed at 5 degrees valgus, 11 mm of bone was   resected off the distal femur due to his pre-operative flexion contracture.  Following this resection, the tibia was   subluxated anteriorly.  Using the extramedullary guide, 2 mm of bone was resected off   the proximal medial tibia.  We confirmed the gap would be   stable medially and laterally with a 10 mm insert as well as confirmed   the cut was perpendicular in the coronal plane, checking with an alignment rod.      Once this was done, I sized the femur to be a size 4 in the anterior-   posterior dimension, chose a standard component based on medial and    lateral dimension.  The size 4 rotation block was then pinned in   position anterior referenced using the C-clamp to set rotation.  The   anterior, posterior, and  chamfer cuts were made without difficulty nor   notching making certain that I was along the anterior cortex to help   with flexion gap stability.      The final box cut was made off the lateral aspect of distal femur.      At this point, the tibia was sized to be a size 5, the size 5 tray was   then pinned in position through the medial third of the tubercle,   drilled, and keel punched.  Trial reduction was now carried with a 4 femur,  5 tibia, a 12.5 mm insert, and the 41 patella botton.  The knee was brought to   extension, full extension with good flexion stability with the patella   tracking through the trochlea without application of pressure.  Given   all these findings, the trial components removed.  Final components were   opened and cement was mixed.  The knee was irrigated with normal saline   solution and pulse lavage.  The synovial lining was   then injected with 30cc 0.25% Marcaine with epinephrine and 1 cc of Toradol mixed with 30cc of saline for a   total of 61 cc.      The knee was irrigated.  Final implants were then cemented onto clean and   dried cut surfaces of bone with the knee brought to extension with a 12.5 mm trial insert.      Once the cement had fully cured, the excess cement was removed   throughout the knee.  I confirmed I was satisfied with the range of   motion and stability, and the final 12.5 mm PS insert was chosen.  It was   placed into the knee.      The tourniquet had been let down at 36 minutes.  No significant   hemostasis required.  The   extensor mechanism was then reapproximated using #1 Vicryl with the  knee   in flexion.  The   remaining wound was closed with 2-0 Vicryl and running 4-0 Monocryl.   The knee was cleaned, dried, dressed sterilely using Dermabond and   Aquacel  dressing.  The patient was then   brought to recovery room in stable condition, tolerating the procedure   well.   Please note that Physician Assistant, Danae Orleans, PA-C, was present for the entirety of the case, and was utilized for pre-operative positioning, peri-operative retractor management, general facilitation of the procedure.  He was also utilized for primary wound closure at the end of the case.              Pietro Cassis Alvan Dame, M.D.    05/21/2014 11:35 AM

## 2014-05-21 NOTE — Anesthesia Procedure Notes (Addendum)
Spinal Patient location during procedure: OR Start time: 05/21/2014 10:00 AM End time: 05/21/2014 10:07 AM Staffing Anesthesiologist: MOSER, CHRIS Resident/CRNA: ,  M Performed by: anesthesiologist and resident/CRNA  Preanesthetic Checklist Completed: patient identified, site marked, surgical consent, pre-op evaluation, timeout performed, IV checked, risks and benefits discussed and monitors and equipment checked Spinal Block Patient position: sitting Prep: ChloraPrep Patient monitoring: heart rate, continuous pulse ox and blood pressure Approach: midline Location: L4-5 Injection technique: single-shot Needle Needle type: Spinocan  Needle gauge: 22 G Needle length: 9 cm Assessment Sensory level: T6 Additional Notes Expiration date of kit checked and confirmed. Patient tolerated procedure well, without complications. X 1 attempt per CRNA  without success T2-T3, MD x 1 L4-L5 with success. Noted loss of motor and sensory on exam post injection.      

## 2014-05-21 NOTE — Interval H&P Note (Signed)
History and Physical Interval Note:  05/21/2014 7:35 AM  Justin Porter  has presented today for surgery, with the diagnosis of right knee oa   The various methods of treatment have been discussed with the patient and family. After consideration of risks, benefits and other options for treatment, the patient has consented to  Procedure(s): RIGHT TOTAL KNEE ARTHROPLASTY (Right) as a surgical intervention .  The patient's history has been reviewed, patient examined, no change in status, stable for surgery.  I have reviewed the patient's chart and labs.  Questions were answered to the patient's satisfaction.     [name]

## 2014-05-21 NOTE — Anesthesia Preprocedure Evaluation (Deleted)

## 2014-05-21 NOTE — Progress Notes (Signed)
Clinical Social Work Department CLINICAL SOCIAL WORK PLACEMENT NOTE 05/21/2014  Patient:  Justin Porter, Justin Porter  Account Number:  [id number] Admit date:  05/21/2014  Clinical Social Worker:  Werner Lean, LCSW  Date/time:  05/21/2014 03:03 PM  Clinical Social Work is seeking post-discharge placement for this patient at the following level of care:   SKILLED NURSING   (*CSW will update this form in Epic as items are completed)     Patient/family provided with [facility] Department of Clinical Social Work's list of facilities offering this level of care within the geographic area requested by the patient (or if unable, by the patient's family).  05/21/2014  Patient/family informed of their freedom to choose among providers that offer the needed level of care, that participate in Medicare, Medicaid or managed care program needed by the patient, have an available bed and are willing to accept the patient.    Patient/family informed of [facility]' ownership interest in [facility], as well as of the fact that they are under no obligation to receive care at this facility.  PASARR submitted to EDS on 05/21/2014 PASARR number received on 05/21/2014  FL2 transmitted to all facilities in geographic area requested by pt/family on  05/21/2014 FL2 transmitted to all facilities within larger geographic area on   Patient informed that his/her managed care company has contracts with or will negotiate with  certain facilities, including the following:     Patient/family informed of bed offers received:   Patient chooses bed at  Physician recommends and patient chooses bed at    Patient to be transferred to  on   Patient to be transferred to facility by  Patient and family notified of transfer on  Name of family member notified:    The following physician request were entered in Epic:   Additional Comments:  Werner Lean LCSW [phone]

## 2014-05-21 NOTE — Transfer of Care (Signed)
Immediate Anesthesia Transfer of Care Note  Patient: Justin Porter  Procedure(s) Performed: Procedure(s) (LRB): RIGHT TOTAL KNEE ARTHROPLASTY (Right)  Patient Location: PACU  Anesthesia Type: Spinal  Level of Consciousness: sedated, patient cooperative and responds to stimulation  Airway & Oxygen Therapy: Patient Spontanous Breathing and Patient connected to face mask oxgen  Post-op Assessment: Report given to PACU RN and Post -op Vital signs reviewed and stable  Post vital signs: Reviewed and stable. S1 level on exam, denied pain.   Complications: No apparent anesthesia complications

## 2014-05-21 NOTE — Plan of Care (Signed)
Problem: Phase I Progression Outcomes Goal: CMS/Neurovascular status WDL Outcome: Completed/Met Date Met:  05/21/14 Goal: Pain controlled with appropriate interventions Outcome: Completed/Met Date Met:  05/21/14 Goal: Dangle or out of bed evening of surgery Outcome: Completed/Met Date Met:  05/21/14 Goal: Initial discharge plan identified Outcome: Completed/Met Date Met:  05/21/14 Goal: Hemodynamically stable Outcome: Completed/Met Date Met:  05/21/14

## 2014-05-21 NOTE — Anesthesia Preprocedure Evaluation (Signed)
Anesthesia Evaluation  Patient identified by MRN, date of birth, ID band Patient awake    Reviewed: Allergy & Precautions, H&P , NPO status , Patient's Chart, lab work & pertinent test results  History of Anesthesia Complications Negative for: history of anesthetic complications  Airway Mallampati: II  TM Distance: >3 FB Neck ROM: Full    Dental  (+) Teeth Intact   Pulmonary neg pulmonary ROS,  breath sounds clear to auscultation        Cardiovascular hypertension, - angina- Past MI Rhythm:Regular     Neuro/Psych    GI/Hepatic negative GI ROS, Neg liver ROS,   Endo/Other  negative endocrine ROS  Renal/GU negative Renal ROS     Musculoskeletal  (+) Arthritis -, Osteoarthritis,    Abdominal   Peds  Hematology negative hematology ROS (+)   Anesthesia Other Findings   Reproductive/Obstetrics                             Anesthesia Physical Anesthesia Plan  ASA: II  Anesthesia Plan: MAC and Spinal   Post-op Pain Management:    Induction:   Airway Management Planned: Natural Airway  Additional Equipment:   Intra-op Plan:   Post-operative Plan:   Informed Consent: I have reviewed the patients History and Physical, chart, labs and discussed the procedure including the risks, benefits and alternatives for the proposed anesthesia with the patient or authorized representative who has indicated his/her understanding and acceptance.   Dental advisory given  Plan Discussed with: CRNA and Surgeon  Anesthesia Plan Comments:         Anesthesia Quick Evaluation

## 2014-05-21 NOTE — Anesthesia Postprocedure Evaluation (Signed)
  Anesthesia Post-op Note  Patient: Justin Porter  Procedure(s) Performed: Procedure(s): RIGHT TOTAL KNEE ARTHROPLASTY (Right)  Patient Location: PACU  Anesthesia Type:Spinal  Level of Consciousness: awake  Airway and Oxygen Therapy: Patient Spontanous Breathing  Post-op Pain: mild  Post-op Assessment: Post-op Vital signs reviewed, Patient's Cardiovascular Status Stable, Respiratory Function Stable, Patent Airway, No signs of Nausea or vomiting and Pain level controlled  Post-op Vital Signs: Reviewed and stable  Last Vitals:  Filed Vitals:   05/21/14 1715  BP: 171/82  Pulse: 66  Temp: 36.4 C  Resp: 16    Complications: No apparent anesthesia complications

## 2014-05-21 NOTE — Progress Notes (Signed)
Utilization review completed.  

## 2014-05-21 NOTE — Progress Notes (Signed)
Clinical Social Work Department BRIEF PSYCHOSOCIAL ASSESSMENT 05/21/2014  Patient:  Justin Porter, Justin Porter     Account Number:  [id number]     Admit date:  05/21/2014  Clinical Social Worker:  Lacie Scotts  Date/Time:  05/21/2014 02:54 PM  Referred by:  Physician  Date Referred:  05/21/2014 Referred for  SNF Placement   Other Referral:   Interview type:  Patient Other interview type:    PSYCHOSOCIAL DATA Living Status:  WIFE Admitted from facility:   Level of care:   Primary support name:  Ann Primary support relationship to patient:  SPOUSE Degree of support available:   supportive    CURRENT CONCERNS Current Concerns  Post-Acute Placement   Other Concerns:    SOCIAL WORK ASSESSMENT / PLAN Pt is an 80 yr old gentleman living at home prior to hospitalization. CSW met with pt / family to assist with d/c planning. This is a planned admission. Pt has made prior arrangements to have ST Rehab at [facility] in [place] following hospital d/c. CSW has contacted SNF and is awaiting confirmation. CSW will continue to follow to assist with d/c planning to SNF.   Assessment/plan status:  Psychosocial Support/Ongoing Assessment of Needs Other assessment/ plan:   Information/referral to community resources:   Insurance coverage  for SNF and ambulance transport reviewed.    PATIENT'S/FAMILY'S RESPONSE TO PLAN OF CARE: Pt is surrounded by family members and his mood is bright. Pt has been to [facility] in the past and is looking forward to returning for rehab.    Werner Lean LCSW [phone]

## 2014-05-22 ENCOUNTER — Encounter (HOSPITAL_COMMUNITY): Payer: Self-pay | Admitting: Orthopedic Surgery

## 2014-05-22 LAB — BASIC METABOLIC PANEL
Anion gap: 11 (ref 5–15)
BUN: 13 mg/dL (ref 6–23)
CALCIUM: 8.7 mg/dL (ref 8.4–10.5)
CO2: 27 mEq/L (ref 19–32)
CREATININE: 0.72 mg/dL (ref 0.50–1.35)
Chloride: 98 mEq/L (ref 96–112)
GFR, EST NON AFRICAN AMERICAN: 84 mL/min — AB (ref >90)
GLUCOSE: 126 mg/dL — AB (ref 70–99)
Potassium: 4 mEq/L (ref 3.7–5.3)
Sodium: 136 mEq/L — ABNORMAL LOW (ref 137–147)

## 2014-05-22 LAB — CBC
HEMATOCRIT: 31.2 % — AB (ref 39.0–52.0)
HEMOGLOBIN: 10.5 g/dL — AB (ref 13.0–17.0)
MCH: 30.7 pg (ref 26.0–34.0)
MCHC: 33.7 g/dL (ref 30.0–36.0)
MCV: 91.2 fL (ref 78.0–100.0)
Platelets: 202 10*3/uL (ref 150–400)
RBC: 3.42 MIL/uL — ABNORMAL LOW (ref 4.22–5.81)
RDW: 13.1 % (ref 11.5–15.5)
WBC: 10 10*3/uL (ref 4.0–10.5)

## 2014-05-22 NOTE — Evaluation (Signed)
Occupational Therapy Evaluation Patient Details Name: Justin Porter MRN: [id number] DOB: 1929/05/29 Today's Date: 05/22/2014    History of Present Illness 80 yo male s/p R TKA.    Clinical Impression   Pt tends to be impulsive with activity. Needed reminder to have his walker in front of him before attempting to stand. He tends to push the walker too far in front of him and steps outside of the walker at times. He also tends to pick up the walker instead of rolling it along (has a standard walker at home). Will benefit from continued OT to progress safety and independence with self care tasks for d/c next venue.     Follow Up Recommendations  SNF;Supervision/Assistance - 24 hour    Equipment Recommendations  None recommended by OT    Recommendations for Other Services       Precautions / Restrictions Precautions Precautions: Fall Restrictions Weight Bearing Restrictions: No RLE Weight Bearing: Weight bearing as tolerated      Mobility Bed Mobility              Transfers Overall transfer level: Needs assistance Equipment used: Rolling walker (2 wheeled) Transfers: Sit to/from Stand Sit to Stand: Min assist         General transfer comment: pt impulsive with standing, verbal cues to have walker in front of him prior to standing. min assist to steady.    Balance                                            ADL Overall ADL's : Needs assistance/impaired Eating/Feeding: Independent;Sitting   Grooming: Wash/dry hands;Set up;Sitting   Upper Body Bathing: Set up;Sitting   Lower Body Bathing: Minimal assistance;Sit to/from stand   Upper Body Dressing : Set up;Sitting   Lower Body Dressing: Sit to/from stand;Moderate assistance   Toilet Transfer: Minimal assistance;Ambulation;BSC;RW   Toileting- Clothing Manipulation and Hygiene: Minimal assistance;Sit to/from stand         General ADL Comments: Pt is impulsive with activity and stood  up before OT ready without walker in front of him. Cautioned pt to make sure he has walker in front of him before standing. He also tends to push walker too far in front of him and steps outside of walker at times. In the bathroom with the tighter space at the commode, he attempted to place walker out too far in front of him at an angle. Demonstrated to pt that he needs to stay inside of the walker at all times and take small side stps inside of walker if he encounters tight spaces. Demonstrated safety techniques with how to use walker properly. Wife present and has helped with previous surgeries for LB self care. Reviewed sequence for LB dressing and again the need to have walker in front when he stands.      Vision                     Perception     Praxis      Pertinent Vitals/Pain Pain Assessment: 0-10 Pain Score: 3  Pain Location: R knee Pain Descriptors / Indicators: Sore Pain Intervention(s): Repositioned;Ice applied     Hand Dominance     Extremity/Trunk Assessment Upper Extremity Assessment Upper Extremity Assessment: Overall WFL for tasks assessed      Cervical / Trunk Assessment Cervical / Trunk Assessment: Kyphotic  Communication Communication Communication: HOH   Cognition Arousal/Alertness: Awake/alert Behavior During Therapy: Impulsive Overall Cognitive Status: Within Functional Limits for tasks assessed                     General Comments       Exercises       Shoulder Instructions      Home Living Family/patient expects to be discharged to:: Skilled nursing facility Living Arrangements: Spouse/significant other                                      Prior Functioning/Environment               OT Diagnosis: Generalized weakness   OT Problem List: Decreased strength;Decreased knowledge of use of DME or AE;Decreased safety awareness   OT Treatment/Interventions: Self-care/ADL training;Patient/family  education;Therapeutic activities;DME and/or AE instruction    OT Goals(Current goals can be found in the care plan section) Acute Rehab OT Goals Patient Stated Goal: to regain independence OT Goal Formulation: With patient/family Time For Goal Achievement: 05/29/14 Potential to Achieve Goals: Good  OT Frequency: Min 2X/week   Barriers to D/C:            Co-evaluation              End of Session Equipment Utilized During Treatment: Gait belt;Rolling walker  Activity Tolerance: Patient tolerated treatment well Patient left: in chair;with call bell/phone within reach;with family/visitor present   Time: 8101-7510 OT Time Calculation (min): 29 min Charges:  OT General Charges $OT Visit: 1 Procedure OT Evaluation $Initial OT Evaluation Tier I: 1 Procedure OT Treatments $Self Care/Home Management : 8-22 mins $Therapeutic Activity: 8-22 mins G-Codes:    Jules Schick  [phone] 05/22/2014, 12:22 PM

## 2014-05-22 NOTE — Evaluation (Signed)
Physical Therapy Evaluation Patient Details Name: Justin Porter MRN: [id number] DOB: May 10, 1929 Today's Date: 05/22/2014   History of Present Illness  80 yo male s/p R TKA.   Clinical Impression  On eval, pt required Min assist for mobility-able to ambulate ~100 feet with RW. Tolerated activity well. Rated pain as 3-4/10 with activity.  Plan is for ST rehab at SNF.     Follow Up Recommendations SNF    Equipment Recommendations  None recommended by PT    Recommendations for Other Services       Precautions / Restrictions Precautions Precautions: Fall Restrictions Weight Bearing Restrictions: No RLE Weight Bearing: Weight bearing as tolerated      Mobility  Bed Mobility Overal bed mobility: Needs Assistance Bed Mobility: Supine to Sit     Supine to sit: Min guard;HOB elevated     General bed mobility comments: Increased time. close guard for safety.   Transfers Overall transfer level: Needs assistance Equipment used: Rolling walker (2 wheeled) Transfers: Sit to/from Stand Sit to Stand: Min assist;From elevated surface         General transfer comment: Assist to rise, stabilize, control descent. VCSs afety, technique hand placement, R Le placement  Ambulation/Gait Ambulation/Gait assistance: Min assist Ambulation Distance (Feet): 100 Feet   Gait Pattern/deviations: Trunk flexed;Decreased stride length;Step-to pattern;Step-through pattern;Antalgic     General Gait Details: assist to stabilized initially. VCs safety, technique, sequence.   Stairs            Wheelchair Mobility    Modified Rankin (Stroke Patients Only)       Balance                                             Pertinent Vitals/Pain Pain Assessment: 0-10 Pain Score: 3  Pain Location: r knee Pain Intervention(s): Premedicated before session;Ice applied;Monitored during session    Home Living Family/patient expects to be discharged to:: Skilled nursing  facility Living Arrangements: Spouse/significant other                    Prior Function                 Hand Dominance        Extremity/Trunk Assessment   Upper Extremity Assessment: Defer to OT evaluation           Lower Extremity Assessment: RLE deficits/detail RLE Deficits / Details: hip flex at least 3/5, hip abd/add at least 3/5, knee ext at least 3/5, moves ankle well    Cervical / Trunk Assessment: Kyphotic  Communication   Communication: HOH  Cognition Arousal/Alertness: Awake/alert Behavior During Therapy: WFL for tasks assessed/performed Overall Cognitive Status: Within Functional Limits for tasks assessed                      General Comments      Exercises Total Joint Exercises Quad Sets: AROM;Both;10 reps;Seated Hip ABduction/ADduction: AROM;Right;10 reps;Seated Straight Leg Raises: AROM;Right;10 reps;Seated Long Arc Quad: AROM;Right;10 reps;Seated Knee Flexion: AROM;Right;10 reps;Seated Goniometric ROM: 5-85 degrees      Assessment/Plan    PT Assessment Patient needs continued PT services  PT Diagnosis Difficulty walking   PT Problem List Decreased strength;Decreased range of motion;Decreased activity tolerance;Decreased balance;Decreased mobility;Decreased knowledge of use of DME;Pain  PT Treatment Interventions Gait training;DME instruction;Functional mobility training;Therapeutic activities;Therapeutic exercise;Patient/family education;Balance training   PT  Goals (Current goals can be found in the Care Plan section) Acute Rehab PT Goals Patient Stated Goal: to regain independence PT Goal Formulation: With patient/family Time For Goal Achievement: 05/29/14 Potential to Achieve Goals: Good    Frequency 7X/week   Barriers to discharge        Co-evaluation               End of Session Equipment Utilized During Treatment: Gait belt Activity Tolerance: Patient tolerated treatment well Patient left: in  chair;with call bell/phone within reach;with family/visitor present           Time: 0902-0930 PT Time Calculation (min): 28 min   Charges:   PT Evaluation $Initial PT Evaluation Tier I: 1 Procedure PT Treatments $Gait Training: 8-22 mins $Therapeutic Exercise: 8-22 mins   PT G Codes:          Weston Anna, MPT Pager: [phone]

## 2014-05-22 NOTE — Progress Notes (Signed)
Patient ID: Justin Porter, male   DOB: 11-30-1928, 80 y.o.   MRN: [id number] Subjective: 1 Day Post-Op Procedure(s) (LRB): RIGHT TOTAL KNEE ARTHROPLASTY (Right)    Patient reports pain as mild.  Right knee doing fine, still with concerns for his left hip/groin pain which we are following in the office   Objective:   VITALS:   Filed Vitals:   05/22/14 1000  BP: 138/60  Pulse: 69  Temp: 98.4 F (36.9 C)  Resp: 16    Neurovascular intact Incision: dressing C/D/I  LABS  Recent Labs  05/22/14 0355  HGB 10.5*  HCT 31.2*  WBC 10.0  PLT 202     Recent Labs  05/22/14 0355  NA 136*  K 4.0  BUN 13  CREATININE 0.72  GLUCOSE 126*    No results for input(s): LABPT, INR in the last 72 hours.   Assessment/Plan: 1 Day Post-Op Procedure(s) (LRB): RIGHT TOTAL KNEE ARTHROPLASTY (Right)   Advance diet Up with therapy Plan for discharge tomorrow Discharge to SNF   Will continue to follow issues with regards to the left hip/groin in the office

## 2014-05-22 NOTE — Care Management Note (Signed)
    Page 1 of 1   05/22/2014     2:53:14 PM CARE MANAGEMENT NOTE 05/22/2014  Patient:  MAXIMUM, REILAND   Account Number:  [id number]  Date Initiated:  05/22/2014  Documentation initiated by:  [facility]  Subjective/Objective Assessment:   adm: RIGHT TOTAL KNEE ARTHROPLASTY (Right     Action/Plan:   discharge planning   Anticipated DC Date:  05/22/2014   Anticipated DC Plan:  SKILLED NURSING FACILITY         Choice offered to / List presented to:             Status of service:  Completed, signed off Medicare Important Message given?   (If response is "NO", the following Medicare IM given date fields will be blank) Date Medicare IM given:   Medicare IM given by:   Date Additional Medicare IM given:   Additional Medicare IM given by:    Discharge Disposition:  [place]  Per UR Regulation:    If discussed at Long Length of Stay Meetings, dates discussed:    Comments:  05/22/14 14:50 CM notes pt to rehab at [facility]; [place] arranging. No other CM needs were communicated.  Mariane Masters, BSN, CM [phone].

## 2014-05-22 NOTE — Progress Notes (Signed)
Physical Therapy Treatment Patient Details Name: Justin Porter MRN: [id number] DOB: 1928-10-16 Today's Date: 05/22/2014    History of Present Illness 80 yo male s/p R TKA.     PT Comments    Progressing with mobility. Plan is for possible d/c to SNF on tomorrow.   Follow Up Recommendations  SNF     Equipment Recommendations  None recommended by PT    Recommendations for Other Services       Precautions / Restrictions Precautions Precautions: Fall Restrictions Weight Bearing Restrictions: No RLE Weight Bearing: Weight bearing as tolerated    Mobility  Bed Mobility                  Transfers Overall transfer level: Needs assistance Equipment used: Rolling walker (2 wheeled) Transfers: Sit to/from Stand Sit to Stand: Min assist         General transfer comment: pt impulsive with standing, verbal cues to have walker in front of him prior to standing. min assist to steady.  Ambulation/Gait Ambulation/Gait assistance: Min guard Ambulation Distance (Feet): 200 Feet Assistive device: Rolling walker (2 wheeled) Gait Pattern/deviations: Step-through pattern;Decreased stride length;Trunk flexed     General Gait Details: assist to stabilized initially. VCs safety, technique. fair gait speed. close guard for safety   Stairs            Wheelchair Mobility    Modified Rankin (Stroke Patients Only)       Balance                                    Cognition Arousal/Alertness: Awake/alert Behavior During Therapy: WFL for tasks assessed/performed Overall Cognitive Status: Within Functional Limits for tasks assessed                      Exercises      General Comments        Pertinent Vitals/Pain Pain Assessment: 0-10 Pain Score: 3  Pain Location: R knee Pain Descriptors / Indicators: Sore Pain Intervention(s): Ice applied;Repositioned    Home Living Family/patient expects to be discharged to:: Skilled nursing  facility Living Arrangements: Spouse/significant other                  Prior Function            PT Goals (current goals can now be found in the care plan section) Acute Rehab PT Goals Patient Stated Goal: to regain independence Progress towards PT goals: Progressing toward goals    Frequency  7X/week    PT Plan Current plan remains appropriate    Co-evaluation             End of Session Equipment Utilized During Treatment: Gait belt Activity Tolerance: Patient tolerated treatment well Patient left: in chair;with call bell/phone within reach     Time: 1330-1344 PT Time Calculation (min): 14 min  Charges:  $Gait Training: 8-22 mins                    G Codes:      Weston Anna, MPT Pager: [phone]

## 2014-05-23 DIAGNOSIS — R350 Frequency of micturition: Secondary | ICD-10-CM | POA: Diagnosis not present

## 2014-05-23 DIAGNOSIS — M25561 Pain in right knee: Secondary | ICD-10-CM | POA: Diagnosis not present

## 2014-05-23 DIAGNOSIS — D649 Anemia, unspecified: Secondary | ICD-10-CM | POA: Diagnosis not present

## 2014-05-23 DIAGNOSIS — H9190 Unspecified hearing loss, unspecified ear: Secondary | ICD-10-CM | POA: Diagnosis not present

## 2014-05-23 DIAGNOSIS — E785 Hyperlipidemia, unspecified: Secondary | ICD-10-CM | POA: Diagnosis not present

## 2014-05-23 DIAGNOSIS — M1711 Unilateral primary osteoarthritis, right knee: Secondary | ICD-10-CM | POA: Diagnosis not present

## 2014-05-23 DIAGNOSIS — D500 Iron deficiency anemia secondary to blood loss (chronic): Secondary | ICD-10-CM | POA: Diagnosis not present

## 2014-05-23 DIAGNOSIS — Z96651 Presence of right artificial knee joint: Secondary | ICD-10-CM | POA: Diagnosis not present

## 2014-05-23 DIAGNOSIS — Z85118 Personal history of other malignant neoplasm of bronchus and lung: Secondary | ICD-10-CM | POA: Diagnosis not present

## 2014-05-23 DIAGNOSIS — G8918 Other acute postprocedural pain: Secondary | ICD-10-CM | POA: Diagnosis not present

## 2014-05-23 DIAGNOSIS — I10 Essential (primary) hypertension: Secondary | ICD-10-CM | POA: Diagnosis not present

## 2014-05-23 DIAGNOSIS — Z471 Aftercare following joint replacement surgery: Secondary | ICD-10-CM | POA: Diagnosis not present

## 2014-05-23 LAB — CBC
HCT: 28 % — ABNORMAL LOW (ref 39.0–52.0)
Hemoglobin: 9.5 g/dL — ABNORMAL LOW (ref 13.0–17.0)
MCH: 31.4 pg (ref 26.0–34.0)
MCHC: 33.9 g/dL (ref 30.0–36.0)
MCV: 92.4 fL (ref 78.0–100.0)
PLATELETS: 188 10*3/uL (ref 150–400)
RBC: 3.03 MIL/uL — ABNORMAL LOW (ref 4.22–5.81)
RDW: 13.4 % (ref 11.5–15.5)
WBC: 9.7 10*3/uL (ref 4.0–10.5)

## 2014-05-23 LAB — BASIC METABOLIC PANEL
ANION GAP: 14 (ref 5–15)
BUN: 19 mg/dL (ref 6–23)
CO2: 26 mEq/L (ref 19–32)
CREATININE: 0.82 mg/dL (ref 0.50–1.35)
Calcium: 8.7 mg/dL (ref 8.4–10.5)
Chloride: 102 mEq/L (ref 96–112)
GFR calc Af Amer: >90 mL/min (ref >90)
GFR, EST NON AFRICAN AMERICAN: 80 mL/min — AB (ref >90)
Glucose, Bld: 96 mg/dL (ref 70–99)
Potassium: 4.2 mEq/L (ref 3.7–5.3)
SODIUM: 142 meq/L (ref 137–147)

## 2014-05-23 MED ORDER — HYDROCODONE-ACETAMINOPHEN 7.5-325 MG PO TABS: 1.0000 | ORAL_TABLET | ORAL | Status: DC | PRN

## 2014-05-23 MED ORDER — DSS 100 MG PO CAPS: 100.0000 mg | ORAL_CAPSULE | Freq: Two times a day (BID) | ORAL | Status: DC

## 2014-05-23 MED ORDER — TIZANIDINE HCL 4 MG PO TABS: 4.0000 mg | ORAL_TABLET | Freq: Four times a day (QID) | ORAL | Status: DC | PRN

## 2014-05-23 MED ORDER — FERROUS SULFATE 325 (65 FE) MG PO TABS: 325.0000 mg | ORAL_TABLET | Freq: Three times a day (TID) | ORAL | Status: DC

## 2014-05-23 MED ORDER — POLYETHYLENE GLYCOL 3350 17 G PO PACK: 17.0000 g | PACK | Freq: Two times a day (BID) | ORAL | Status: AC

## 2014-05-23 MED ORDER — ASPIRIN 325 MG PO TBEC: 325.0000 mg | DELAYED_RELEASE_TABLET | Freq: Two times a day (BID) | ORAL | Status: AC

## 2014-05-23 NOTE — Progress Notes (Signed)
Physical Therapy Treatment Patient Details Name: Justin Porter MRN: [id number] DOB: 11-21-1928 Today's Date: 05/23/2014    History of Present Illness 80 yo male s/p R TKA.     PT Comments    Continuing to progress well. Tolerated increase in activity. Plan is for d/c to SNF later today. Ready from PT standpoint.   Follow Up Recommendations  SNF     Equipment Recommendations  None recommended by PT    Recommendations for Other Services       Precautions / Restrictions Precautions Precautions: Fall;Knee Restrictions Weight Bearing Restrictions: No RLE Weight Bearing: Weight bearing as tolerated    Mobility  Bed Mobility               General bed mobility comments: pt OOB in recliner  Transfers Overall transfer level: Needs assistance Equipment used: Rolling walker (2 wheeled) Transfers: Sit to/from Stand Sit to Stand: Min guard         General transfer comment: close guard for safety.   Ambulation/Gait Ambulation/Gait assistance: Min guard Ambulation Distance (Feet): 350 Feet Assistive device: Rolling walker (2 wheeled) Gait Pattern/deviations: Step-through pattern;Decreased stride length;Trunk flexed     General Gait Details: close guard for safety   Stairs            Wheelchair Mobility    Modified Rankin (Stroke Patients Only)       Balance                                    Cognition                            Exercises Total Joint Exercises Ankle Circles/Pumps: AROM;Both;15 reps;Seated Quad Sets: AROM;Both;15 reps;Seated Hip ABduction/ADduction: AROM;Right;15 reps;Seated Straight Leg Raises: AROM;Right;15 reps;Seated Knee Flexion: AROM;Right;10 reps;Seated Goniometric ROM: 5-90 degrees    General Comments        Pertinent Vitals/Pain Pain Assessment: 0-10 Pain Score: 3  Pain Location: R knee Pain Descriptors / Indicators: Sore Pain Intervention(s): Monitored during session;Ice applied     Home Living                      Prior Function            PT Goals (current goals can now be found in the care plan section) Progress towards PT goals: Progressing toward goals    Frequency  7X/week    PT Plan Current plan remains appropriate    Co-evaluation             End of Session   Activity Tolerance: Patient tolerated treatment well Patient left: in chair;with call bell/phone within reach;with family/visitor present     Time: 0905-0928 PT Time Calculation (min): 23 min  Charges:  $Gait Training: 8-22 mins $Therapeutic Exercise: 8-22 mins                    G Codes:      Weston Anna, MPT Pager: [phone]

## 2014-05-23 NOTE — Progress Notes (Signed)
Clinical Social Work Department CLINICAL SOCIAL WORK PLACEMENT NOTE 05/23/2014  Patient:  Justin Porter, Justin Porter  Account Number:  [id number] Admit date:  05/21/2014  Clinical Social Worker:  Werner Lean, LCSW  Date/time:  05/21/2014 03:03 PM  Clinical Social Work is seeking post-discharge placement for this patient at the following level of care:   SKILLED NURSING   (*CSW will update this form in Epic as items are completed)     Patient/family provided with [facility] Department of Clinical Social Work's list of facilities offering this level of care within the geographic area requested by the patient (or if unable, by the patient's family).  05/21/2014  Patient/family informed of their freedom to choose among providers that offer the needed level of care, that participate in Medicare, Medicaid or managed care program needed by the patient, have an available bed and are willing to accept the patient.    Patient/family informed of [facility]' ownership interest in [facility], as well as of the fact that they are under no obligation to receive care at this facility.  PASARR submitted to EDS on 05/21/2014 PASARR number received on 05/21/2014  FL2 transmitted to all facilities in geographic area requested by pt/family on  05/21/2014 FL2 transmitted to all facilities within larger geographic area on   Patient informed that his/her managed care company has contracts with or will negotiate with  certain facilities, including the following:     Patient/family informed of bed offers received:  05/23/2014 Patient chooses bed at [facility] Physician recommends and patient chooses bed at    Patient to be transferred to [facility] on  05/23/2014 Patient to be transferred to facility by [facility] Patient and family notified of transfer on 05/23/2014 Name of family member notified:  SPOUSE  The following physician request were entered in Epic:   Additional  Comments: Pt / spouse are in agreement with d/c to SNF today. Pt is able to transport by car. NSG reviewed d/c summary, scripts, avs. Scripts included in dc packet.  Werner Lean [phone]

## 2014-05-23 NOTE — Progress Notes (Signed)
     Subjective: 2 Days Post-Op Procedure(s) (LRB): RIGHT TOTAL KNEE ARTHROPLASTY (Right)   Patient reports pain as mild, pain controlled. No events throughout the night. Feels that he is doing quite well.  Ready to be discharged to skilled nursing facility.  Objective:   VITALS:   Filed Vitals:   05/23/14 0410  BP: 148/68  Pulse: 85  Temp: 98.5 F (36.9 C)  Resp: 24    Dorsiflexion/Plantar flexion intact Incision: dressing C/D/I No cellulitis present Compartment soft  LABS  Recent Labs  05/22/14 0355 05/23/14 0423  HGB 10.5* 9.5*  HCT 31.2* 28.0*  WBC 10.0 9.7  PLT 202 188     Recent Labs  05/22/14 0355 05/23/14 0423  NA 136* 142  K 4.0 4.2  BUN 13 19  CREATININE 0.72 0.82  GLUCOSE 126* 96     Assessment/Plan: 2 Days Post-Op Procedure(s) (LRB): RIGHT TOTAL KNEE ARTHROPLASTY (Right) Up with therapy Discharge to SNF  Follow up in 2 weeks at [facility]. Follow up with OLIN,Lula Michaux D in 2 weeks.  Contact information:  [facility] [address] [place] [phone]    Expected ABLA  Treated with iron and will observe        West Pugh. Georgio Hattabaugh   PAC  05/23/2014, 8:16 AM

## 2014-05-23 NOTE — Discharge Summary (Signed)
Physician Discharge Summary  Patient ID: Justin Porter MRN: [id number] DOB/AGE: 1928-09-27 80 y.o.  Admit date: 05/21/2014 Discharge date:  05/23/2014  Procedures:  Procedure(s) (LRB): RIGHT TOTAL KNEE ARTHROPLASTY (Right)  Attending Physician:  Dr. Paralee Cancel   Admission Diagnoses:   Right knee primary OA / pain  Discharge Diagnoses:  Principal Problem:   S/P right TKA Active Problems:   Expected blood loss anemia   S/P knee replacement  Past Medical History  Diagnosis Date  . Arthritis   . Hyperlipidemia   . Hypertension   . Hearing loss   . Radiation 18 yrs ago    for increased psa  . History of prostate cancer   . Urinary frequency   . Cancer     lung - had 1/3 of left lung removed, did not require additional treatment    HPI: Justin Porter, 80 y.o. male, has a history of pain and functional disability in the right knee due to arthritis and has failed non-surgical conservative treatments for greater than 12 weeks to includeNSAID's and/or analgesics, corticosteriod injections, use of assistive devices and activity modification. Onset of symptoms was gradual, starting 3+ years ago with gradually worsening course since that time. The patient noted prior procedures on the knee to include arthroplasty on the left knee 2.5 years ago. Patient currently rates pain in the right knee(s) at 9 out of 10 with activity. Patient has night pain, worsening of pain with activity and weight bearing, pain that interferes with activities of daily living, pain with passive range of motion, crepitus and joint swelling. Patient has evidence of periarticular osteophytes and joint space narrowing by imaging studies. There is no active infection. Risks, benefits and expectations were discussed with the patient. Risks including but not limited to the risk of anesthesia, blood clots, nerve damage, blood vessel damage, failure of the prosthesis, infection and up to and including death.  Patient understand the risks, benefits and expectations and wishes to proceed with surgery.   PCP: [name], MD   Discharged Condition: good  Hospital Course:  Patient underwent the above stated procedure on 05/21/2014. Patient tolerated the procedure well and brought to the recovery room in good condition and subsequently to the floor.  POD #1 BP: 138/60 ; Pulse: 69 ; Temp: 98.4 F (36.9 C) ; Resp: 16 Patient reports pain as mild. Right knee doing fine, still with concerns for his left hip/groin pain which we are following in the office. Dorsiflexion/plantar flexion intact, incision: dressing C/D/I, no cellulitis present and compartment soft.   LABS  Basename    HGB  10.5  HCT  31.2   POD #2  BP: 148/68 ; Pulse: 85 ; Temp: 98.5 F (36.9 C) ; Resp: 24 Patient reports pain as mild, pain controlled. No events throughout the night. Feels that he is doing quite well. Ready to be discharged to skilled nursing facility. Dorsiflexion/plantar flexion intact, incision: dressing C/D/I, no cellulitis present and compartment soft.   LABS  Basename    HGB  9.5  HCT  28.0    Discharge Exam: General appearance: alert, cooperative and no distress Extremities: Homans sign is negative, no sign of DVT, no edema, redness or tenderness in the calves or thighs and no ulcers, gangrene or trophic changes  Disposition:  [facility] with follow up in 2 weeks   Follow-up Information    Follow up with [name], MD. Schedule an appointment as soon as possible for a visit in 2 weeks.  Specialty:  Orthopedic Surgery   Contact information:   [address] [place] [address] [phone]       Discharge Instructions    Call MD / Call 911    Complete by:  As directed   If you experience chest pain or shortness of breath, CALL 911 and be transported to the hospital emergency room.  If you develope a fever above 101 F, pus (white drainage) or increased  drainage or redness at the wound, or calf pain, call your surgeon's office.     Change dressing    Complete by:  As directed   Maintain surgical dressing for 10-14 days, or until follow up in the clinic.     Constipation Prevention    Complete by:  As directed   Drink plenty of fluids.  Prune juice may be helpful.  You may use a stool softener, such as Colace (over the counter) 100 mg twice a day.  Use MiraLax (over the counter) for constipation as needed.     Diet - low sodium heart healthy    Complete by:  As directed      Discharge instructions    Complete by:  As directed   Maintain surgical dressing for 10-14 days, or until follow up in the clinic. Follow up in 2 weeks at [facility]. Call with any questions or concerns.     Driving restrictions    Complete by:  As directed   No driving for 4 weeks     Increase activity slowly as tolerated    Complete by:  As directed      TED hose    Complete by:  As directed   Use stockings (TED hose) for 2 weeks on both leg(s).  You may remove them at night for sleeping.     Weight bearing as tolerated    Complete by:  As directed   Laterality:  right  Extremity:  Lower             Medication List    STOP taking these medications        HYDROcodone-acetaminophen 5-325 MG per tablet  Commonly known as:  NORCO/VICODIN  Replaced by:  HYDROcodone-acetaminophen 7.5-325 MG per tablet     naproxen sodium 220 MG tablet  Commonly known as:  ANAPROX      TAKE these medications        aspirin 325 MG EC tablet  Take 1 tablet (325 mg total) by mouth 2 (two) times daily.     calcium citrate-vitamin D 315-200 MG-UNIT per tablet  Commonly known as:  CITRACAL+D  Take 1 tablet by mouth every morning.     DSS 100 MG Caps  Take 100 mg by mouth 2 (two) times daily.     ferrous sulfate 325 (65 FE) MG tablet  Take 1 tablet (325 mg total) by mouth 3 (three) times daily after meals.     Fish Oil 1200 MG Caps  Take 1 capsule by  mouth every morning.     furosemide 20 MG tablet  Commonly known as:  LASIX  Take 20 mg by mouth 2 (two) times daily.     HYDROcodone-acetaminophen 7.5-325 MG per tablet  Commonly known as:  NORCO  Take 1-2 tablets by mouth every 4 (four) hours as needed for moderate pain.     multivitamin with minerals Tabs tablet  Take 1 tablet by mouth every morning.     OVER THE COUNTER MEDICATION  Take  1 tablet by mouth 2 (two) times daily. Energy Plus     polyethylene glycol packet  Commonly known as:  MIRALAX / GLYCOLAX  Take 17 g by mouth 2 (two) times daily.     Potassium 99 MG Tabs  Take 1 tablet by mouth every morning.     simvastatin 20 MG tablet  Commonly known as:  ZOCOR  Take 20 mg by mouth every evening.     tiZANidine 4 MG tablet  Commonly known as:  ZANAFLEX  Take 1 tablet (4 mg total) by mouth every 6 (six) hours as needed for muscle spasms.     vitamin E 1000 UNIT capsule  Take 1,000 Units by mouth every morning.         Signed: West Pugh. Katiya Fike   PA-C  05/23/2014, 8:36 AM

## 2014-05-24 DIAGNOSIS — G8918 Other acute postprocedural pain: Secondary | ICD-10-CM | POA: Diagnosis not present

## 2014-05-24 DIAGNOSIS — Z96651 Presence of right artificial knee joint: Secondary | ICD-10-CM | POA: Diagnosis not present

## 2014-05-24 DIAGNOSIS — D649 Anemia, unspecified: Secondary | ICD-10-CM | POA: Diagnosis not present

## 2014-05-24 DIAGNOSIS — E785 Hyperlipidemia, unspecified: Secondary | ICD-10-CM | POA: Diagnosis not present

## 2014-05-30 DIAGNOSIS — R2689 Other abnormalities of gait and mobility: Secondary | ICD-10-CM | POA: Diagnosis not present

## 2014-05-30 DIAGNOSIS — M25661 Stiffness of right knee, not elsewhere classified: Secondary | ICD-10-CM | POA: Diagnosis not present

## 2014-05-30 DIAGNOSIS — Z96651 Presence of right artificial knee joint: Secondary | ICD-10-CM | POA: Diagnosis not present

## 2014-05-30 DIAGNOSIS — M25561 Pain in right knee: Secondary | ICD-10-CM | POA: Diagnosis not present

## 2014-06-01 DIAGNOSIS — M25661 Stiffness of right knee, not elsewhere classified: Secondary | ICD-10-CM | POA: Diagnosis not present

## 2014-06-01 DIAGNOSIS — Z96651 Presence of right artificial knee joint: Secondary | ICD-10-CM | POA: Diagnosis not present

## 2014-06-01 DIAGNOSIS — R2689 Other abnormalities of gait and mobility: Secondary | ICD-10-CM | POA: Diagnosis not present

## 2014-06-01 DIAGNOSIS — M25561 Pain in right knee: Secondary | ICD-10-CM | POA: Diagnosis not present

## 2014-06-04 ENCOUNTER — Other Ambulatory Visit: Payer: Self-pay | Admitting: Orthopedic Surgery

## 2014-06-04 DIAGNOSIS — M25561 Pain in right knee: Secondary | ICD-10-CM | POA: Diagnosis not present

## 2014-06-04 DIAGNOSIS — R079 Chest pain, unspecified: Secondary | ICD-10-CM

## 2014-06-04 DIAGNOSIS — M25661 Stiffness of right knee, not elsewhere classified: Secondary | ICD-10-CM | POA: Diagnosis not present

## 2014-06-04 DIAGNOSIS — R2689 Other abnormalities of gait and mobility: Secondary | ICD-10-CM | POA: Diagnosis not present

## 2014-06-04 DIAGNOSIS — Z96651 Presence of right artificial knee joint: Secondary | ICD-10-CM | POA: Diagnosis not present

## 2014-06-06 DIAGNOSIS — M25561 Pain in right knee: Secondary | ICD-10-CM | POA: Diagnosis not present

## 2014-06-06 DIAGNOSIS — Z96651 Presence of right artificial knee joint: Secondary | ICD-10-CM | POA: Diagnosis not present

## 2014-06-06 DIAGNOSIS — M25661 Stiffness of right knee, not elsewhere classified: Secondary | ICD-10-CM | POA: Diagnosis not present

## 2014-06-06 DIAGNOSIS — R2689 Other abnormalities of gait and mobility: Secondary | ICD-10-CM | POA: Diagnosis not present

## 2014-06-07 DIAGNOSIS — M25561 Pain in right knee: Secondary | ICD-10-CM | POA: Diagnosis not present

## 2014-06-07 DIAGNOSIS — Z96651 Presence of right artificial knee joint: Secondary | ICD-10-CM | POA: Diagnosis not present

## 2014-06-07 DIAGNOSIS — M25661 Stiffness of right knee, not elsewhere classified: Secondary | ICD-10-CM | POA: Diagnosis not present

## 2014-06-07 DIAGNOSIS — R2689 Other abnormalities of gait and mobility: Secondary | ICD-10-CM | POA: Diagnosis not present

## 2014-06-11 ENCOUNTER — Telehealth: Payer: Self-pay | Admitting: Oncology

## 2014-06-11 ENCOUNTER — Ambulatory Visit
Admission: RE | Admit: 2014-06-11 | Discharge: 2014-06-11 | Disposition: A | Discharge status: Home / Self Care | Payer: Medicare Other | Source: Ambulatory Visit | Attending: Orthopedic Surgery | Admitting: Orthopedic Surgery

## 2014-06-11 DIAGNOSIS — Z8546 Personal history of malignant neoplasm of prostate: Secondary | ICD-10-CM | POA: Diagnosis not present

## 2014-06-11 DIAGNOSIS — R918 Other nonspecific abnormal finding of lung field: Secondary | ICD-10-CM | POA: Diagnosis not present

## 2014-06-11 DIAGNOSIS — I2510 Atherosclerotic heart disease of native coronary artery without angina pectoris: Secondary | ICD-10-CM | POA: Diagnosis not present

## 2014-06-11 DIAGNOSIS — Z85118 Personal history of other malignant neoplasm of bronchus and lung: Secondary | ICD-10-CM | POA: Diagnosis not present

## 2014-06-11 DIAGNOSIS — R079 Chest pain, unspecified: Secondary | ICD-10-CM

## 2014-06-11 MED ORDER — IOHEXOL 300 MG/ML  SOLN
75.0000 mL | Freq: Once | INTRAMUSCULAR | Status: AC | PRN
  Administered 2014-06-11: 75 mL via INTRAVENOUS

## 2014-06-11 NOTE — Telephone Encounter (Signed)
Pt's wife called and confirmed r/s D/T for labs due to therapy from knee surgery..... KJ

## 2014-06-12 DIAGNOSIS — Z96651 Presence of right artificial knee joint: Secondary | ICD-10-CM | POA: Diagnosis not present

## 2014-06-12 DIAGNOSIS — R2689 Other abnormalities of gait and mobility: Secondary | ICD-10-CM | POA: Diagnosis not present

## 2014-06-12 DIAGNOSIS — M25661 Stiffness of right knee, not elsewhere classified: Secondary | ICD-10-CM | POA: Diagnosis not present

## 2014-06-12 DIAGNOSIS — M25561 Pain in right knee: Secondary | ICD-10-CM | POA: Diagnosis not present

## 2014-06-13 DIAGNOSIS — R2689 Other abnormalities of gait and mobility: Secondary | ICD-10-CM | POA: Diagnosis not present

## 2014-06-13 DIAGNOSIS — M25561 Pain in right knee: Secondary | ICD-10-CM | POA: Diagnosis not present

## 2014-06-13 DIAGNOSIS — Z96651 Presence of right artificial knee joint: Secondary | ICD-10-CM | POA: Diagnosis not present

## 2014-06-13 DIAGNOSIS — M25661 Stiffness of right knee, not elsewhere classified: Secondary | ICD-10-CM | POA: Diagnosis not present

## 2014-06-15 DIAGNOSIS — M25561 Pain in right knee: Secondary | ICD-10-CM | POA: Diagnosis not present

## 2014-06-15 DIAGNOSIS — M25661 Stiffness of right knee, not elsewhere classified: Secondary | ICD-10-CM | POA: Diagnosis not present

## 2014-06-15 DIAGNOSIS — R2689 Other abnormalities of gait and mobility: Secondary | ICD-10-CM | POA: Diagnosis not present

## 2014-06-15 DIAGNOSIS — Z96651 Presence of right artificial knee joint: Secondary | ICD-10-CM | POA: Diagnosis not present

## 2014-06-18 ENCOUNTER — Other Ambulatory Visit (HOSPITAL_BASED_OUTPATIENT_CLINIC_OR_DEPARTMENT_OTHER): Payer: Medicare Other

## 2014-06-18 DIAGNOSIS — Z96651 Presence of right artificial knee joint: Secondary | ICD-10-CM | POA: Diagnosis not present

## 2014-06-18 DIAGNOSIS — R2689 Other abnormalities of gait and mobility: Secondary | ICD-10-CM | POA: Diagnosis not present

## 2014-06-18 DIAGNOSIS — C61 Malignant neoplasm of prostate: Secondary | ICD-10-CM

## 2014-06-18 DIAGNOSIS — M25661 Stiffness of right knee, not elsewhere classified: Secondary | ICD-10-CM | POA: Diagnosis not present

## 2014-06-18 DIAGNOSIS — M25561 Pain in right knee: Secondary | ICD-10-CM | POA: Diagnosis not present

## 2014-06-18 LAB — CBC WITH DIFFERENTIAL/PLATELET
BASO%: 0.3 % (ref 0.0–2.0)
Basophils Absolute: 0 10*3/uL (ref 0.0–0.1)
EOS%: 3.2 % (ref 0.0–7.0)
Eosinophils Absolute: 0.2 10*3/uL (ref 0.0–0.5)
HEMATOCRIT: 35.1 % — AB (ref 38.4–49.9)
HGB: 11.3 g/dL — ABNORMAL LOW (ref 13.0–17.1)
LYMPH#: 1.1 10*3/uL (ref 0.9–3.3)
LYMPH%: 17.3 % (ref 14.0–49.0)
MCH: 30.5 pg (ref 27.2–33.4)
MCHC: 32.2 g/dL (ref 32.0–36.0)
MCV: 94.9 fL (ref 79.3–98.0)
MONO#: 0.6 10*3/uL (ref 0.1–0.9)
MONO%: 9.8 % (ref 0.0–14.0)
NEUT#: 4.3 10*3/uL (ref 1.5–6.5)
NEUT%: 69.4 % (ref 39.0–75.0)
Platelets: 251 10*3/uL (ref 140–400)
RBC: 3.7 10*6/uL — ABNORMAL LOW (ref 4.20–5.82)
RDW: 13.5 % (ref 11.0–14.6)
WBC: 6.2 10*3/uL (ref 4.0–10.3)

## 2014-06-18 LAB — COMPREHENSIVE METABOLIC PANEL (CC13)
ALK PHOS: 123 U/L (ref 40–150)
ALT: 10 U/L (ref 0–55)
AST: 18 U/L (ref 5–34)
Albumin: 3.6 g/dL (ref 3.5–5.0)
Anion Gap: 11 mEq/L (ref 3–11)
BILIRUBIN TOTAL: 0.46 mg/dL (ref 0.20–1.20)
BUN: 12 mg/dL (ref 7.0–26.0)
CO2: 31 mEq/L — ABNORMAL HIGH (ref 22–29)
CREATININE: 0.8 mg/dL (ref 0.7–1.3)
Calcium: 9.5 mg/dL (ref 8.4–10.4)
Chloride: 97 mEq/L — ABNORMAL LOW (ref 98–109)
Glucose: 93 mg/dl (ref 70–140)
Potassium: 4.2 mEq/L (ref 3.5–5.1)
Sodium: 139 mEq/L (ref 136–145)
Total Protein: 6.8 g/dL (ref 6.4–8.3)

## 2014-06-19 ENCOUNTER — Other Ambulatory Visit: Payer: Medicare Other

## 2014-06-19 LAB — PSA: PSA: 31.56 ng/mL — ABNORMAL HIGH (ref <=4.00)

## 2014-06-20 DIAGNOSIS — Z96651 Presence of right artificial knee joint: Secondary | ICD-10-CM | POA: Diagnosis not present

## 2014-06-20 DIAGNOSIS — R2689 Other abnormalities of gait and mobility: Secondary | ICD-10-CM | POA: Diagnosis not present

## 2014-06-20 DIAGNOSIS — M25561 Pain in right knee: Secondary | ICD-10-CM | POA: Diagnosis not present

## 2014-06-20 DIAGNOSIS — M25661 Stiffness of right knee, not elsewhere classified: Secondary | ICD-10-CM | POA: Diagnosis not present

## 2014-06-22 ENCOUNTER — Ambulatory Visit (HOSPITAL_BASED_OUTPATIENT_CLINIC_OR_DEPARTMENT_OTHER): Payer: Medicare Other | Admitting: Oncology

## 2014-06-22 ENCOUNTER — Telehealth: Payer: Self-pay | Admitting: Oncology

## 2014-06-22 VITALS — BP 148/74 | HR 78 | Temp 98.0°F | Resp 18 | Ht 72.0 in | Wt 184.7 lb

## 2014-06-22 DIAGNOSIS — E291 Testicular hypofunction: Secondary | ICD-10-CM

## 2014-06-22 DIAGNOSIS — Z96651 Presence of right artificial knee joint: Secondary | ICD-10-CM | POA: Diagnosis not present

## 2014-06-22 DIAGNOSIS — R918 Other nonspecific abnormal finding of lung field: Secondary | ICD-10-CM

## 2014-06-22 DIAGNOSIS — M25661 Stiffness of right knee, not elsewhere classified: Secondary | ICD-10-CM | POA: Diagnosis not present

## 2014-06-22 DIAGNOSIS — C61 Malignant neoplasm of prostate: Secondary | ICD-10-CM | POA: Diagnosis not present

## 2014-06-22 DIAGNOSIS — C7951 Secondary malignant neoplasm of bone: Secondary | ICD-10-CM

## 2014-06-22 DIAGNOSIS — R2689 Other abnormalities of gait and mobility: Secondary | ICD-10-CM | POA: Diagnosis not present

## 2014-06-22 DIAGNOSIS — M25561 Pain in right knee: Secondary | ICD-10-CM | POA: Diagnosis not present

## 2014-06-22 MED ORDER — ENZALUTAMIDE 40 MG PO CAPS: 160.0000 mg | ORAL_CAPSULE | Freq: Every day | ORAL | Status: DC

## 2014-06-22 NOTE — Progress Notes (Signed)
Patient and wife given written information on xtandi and script given to Justin Porter in managed care.

## 2014-06-22 NOTE — Addendum Note (Signed)
Addended by: Randolm Idol on: 06/22/2014 10:05 AM   Modules accepted: Medications

## 2014-06-22 NOTE — Telephone Encounter (Signed)
gv and printed appt sched and avs for pt for Jan 2016 °

## 2014-06-22 NOTE — Progress Notes (Signed)
Hematology and Oncology Follow Up Visit  Justin Porter [id number] 05/11/1929 80 y.o. 06/22/2014 9:29 AM Justin Porter, MDHusain, Justin Ar, MD   Principle Diagnosis: 80 year old gentleman with Castration resistant metastatic prostate cancer with disease to the bone. His initial diagnosis was in 1995.  Prior Therapy:  He is status post prostatectomy and subsequently developed biochemical relapse treated with salvage radiation therapy. He did develop presumed pulmonary metastasis status post surgical resection in 2003. The pathology confirmed the presence of adenocarcinoma from a prostate etiology. He has been on androgen depravation initially intermittently and subsequently continuously since 2013. Most recently he developed a rise in his PSA with a doubling time less than 6 months. He also has bony metastasis.    Current therapy:  Androgen deprivation only.   Interim History:  Justin Porter presents today for a followup visit with his wife.  Since the last visit, he underwent a right knee total replacement on 05/21/2014. He tolerated it well and currently undergoing physical therapy. His mobility is improved and he resumed most activities of daily living. He still has some slowness in movement related to a muscle pull in his groin. He is also reporting lower extremity edema. Otherwise he does not report any bony symptoms.He does not report any constitutional symptoms of fevers or chills or weight loss. He does not report any neurological symptoms of headaches or blurry vision or syncope. Is not reporting any seizures or dementia. He has not reported any chest pain or palpitation. He has not reported any cough or hemoptysis. He has not reported any nausea or vomiting or abdominal pain. He does not report any frequency urgency or hesitancy or hematuria. He does not report any lymphadenopathy or petechiae. He does not report any skin rashes or lesions. He does not report any depression or side he does  report hearing loss. Rest of his review of systems unremarkable.      Medications: I have reviewed the patient's current medications.  Current Outpatient Prescriptions  Medication Sig Dispense Refill  . calcium citrate-vitamin D (CITRACAL+D) 315-200 MG-UNIT per tablet Take 1 tablet by mouth every morning.     . docusate sodium 100 MG CAPS Take 100 mg by mouth 2 (two) times daily. 10 capsule 0  . enzalutamide (XTANDI) 40 MG capsule Take 4 capsules (160 mg total) by mouth daily. 120 capsule 0  . ferrous sulfate 325 (65 FE) MG tablet Take 1 tablet (325 mg total) by mouth 3 (three) times daily after meals.  3  . furosemide (LASIX) 20 MG tablet Take 20 mg by mouth 2 (two) times daily.    Marland Kitchen HYDROcodone-acetaminophen (NORCO) 7.5-325 MG per tablet Take 1-2 tablets by mouth every 4 (four) hours as needed for moderate pain. 100 tablet 0  . Multiple Vitamin (MULITIVITAMIN WITH MINERALS) TABS Take 1 tablet by mouth every morning.    . Omega-3 Fatty Acids (FISH OIL) 1200 MG CAPS Take 1 capsule by mouth every morning.     Marland Kitchen OVER THE COUNTER MEDICATION Take 1 tablet by mouth 2 (two) times daily. Energy Plus    . polyethylene glycol (MIRALAX / GLYCOLAX) packet Take 17 g by mouth 2 (two) times daily. 14 each 0  . Potassium 99 MG TABS Take 1 tablet by mouth every morning.    . simvastatin (ZOCOR) 20 MG tablet Take 20 mg by mouth every evening.     Marland Kitchen tiZANidine (ZANAFLEX) 4 MG tablet Take 1 tablet (4 mg total) by mouth every 6 (six) hours as needed for  muscle spasms. 30 tablet 0  . vitamin E 1000 UNIT capsule Take 1,000 Units by mouth every morning.     No current facility-administered medications for this visit.     Allergies:  Allergies  Allergen Reactions  . Codeine Nausea And Vomiting  . Shrimp [Shellfish Allergy] Nausea And Vomiting  . Betadine [Povidone Iodine] Rash    Past Medical History, Surgical history, Social history, and Family History were reviewed and updated.   Physical Exam: Blood  pressure 148/74, pulse 78, temperature 98 F (36.7 C), temperature source Oral, resp. rate 18, height 6' (1.829 m), weight 184 lb 11.2 oz (83.779 kg), SpO2 100 %. ECOG: 1 General appearance: alert and cooperative Head: Normocephalic, without obvious abnormality Neck: no adenopathy Lymph nodes: Cervical, supraclavicular, and axillary nodes normal. Heart:regular rate and rhythm, S1, S2 normal, no murmur, click, rub or gallop Lung:chest clear, no wheezing, rales, normal symmetric air entry Abdomin: soft, non-tender, without masses or organomegaly EXT: 2+ edema.   Lab Results: Lab Results  Component Value Date   WBC 6.2 06/18/2014   HGB 11.3* 06/18/2014   HCT 35.1* 06/18/2014   MCV 94.9 06/18/2014   PLT 251 06/18/2014     Chemistry      Component Value Date/Time   NA 139 06/18/2014 1050   NA 142 05/23/2014 0423   K 4.2 06/18/2014 1050   K 4.2 05/23/2014 0423   CL 102 05/23/2014 0423   CO2 31* 06/18/2014 1050   CO2 26 05/23/2014 0423   BUN 12.0 06/18/2014 1050   BUN 19 05/23/2014 0423   CREATININE 0.8 06/18/2014 1050   CREATININE 0.82 05/23/2014 0423   CREATININE 0.75 10/29/2011 1545      Component Value Date/Time   CALCIUM 9.5 06/18/2014 1050   CALCIUM 8.7 05/23/2014 0423   ALKPHOS 123 06/18/2014 1050   ALKPHOS 83 12/14/2011 1044   AST 18 06/18/2014 1050   AST 21 12/14/2011 1044   ALT 10 06/18/2014 1050   ALT 25 12/14/2011 1044   BILITOT 0.46 06/18/2014 1050   BILITOT 0.4 12/14/2011 1044      Results for Justin, Porter (MRN [id number]) as of 06/22/2014 09:06  Ref. Range 02/14/2014 13:51 04/02/2014 12:08 06/18/2014 10:50  PSA Latest Range: <=4.00 ng/mL 12.05 (H) 19.35 (H) 31.56 (H)     EXAM: CT CHEST WITH CONTRAST  TECHNIQUE: Multidetector CT imaging of the chest was performed during intravenous contrast administration.  CONTRAST: 25mL OMNIPAQUE IOHEXOL 300 MG/ML SOLN  COMPARISON: 04/02/2014 bone scan. 11/14/2013 chest x-ray. 09/29/2013 CT abdomen  and pelvis. 09/15/2006 chest CT.  FINDINGS: Post left lobectomy with postsurgical changes noted. New from the prior CT are multiple (greater than 20) nodules throughout all of the residual lung fields. Largest is located within the right middle lobe measuring up to 1.5 cm. With the patient's history of lung cancer, findings are suspicious for metastatic disease. Nonmalignant process such as mycobacterium avium intracellular (MAI) would need to be a diagnosis of exclusion.  Prominent degenerative changes right sternoclavicular junction responsible for radiotracer uptake on recent bone scan. No definitive sclerotic metastatic lesions seen in the region of the chest. Deformity left upper rib of former of prior lobectomy.  Mild prominence of right hilar lymph nodes.  Heart size within normal limits. Coronary artery calcifications.  Atherosclerotic type changes thoracic aorta and great vessels. Ectatic ascending thoracic aorta measures up to 3.5 cm. Recommend annual imaging followup by CTA or MRA. This recommendation follows 2010 ACCF/AHA/AATS/ACR/ASA/SCA/SCAI/SIR/STS/SVM Guidelines for the Diagnosis and Management of  Patients With Thoracic Aortic Disease. Circulation.  2010; 121: [id number]  Small hiatal hernia. Visualized upper abdominal structures unremarkable.  Mild thoracic kyphosis with slight loss of height mid thoracic vertebra without focal compression fracture. Scattered degenerative changes and various degrees of spinal stenosis. Postsurgical changes upper lumbar spine with spinal stenosis.  IMPRESSION: Post left lobectomy with postsurgical changes noted.  New from the prior CT are multiple (greater than 20) nodules throughout all of the residual lung fields. Largest is located within the right middle lobe measuring up to 1.5 cm. With the patient's history of lung cancer, findings are suspicious for metastatic disease. Nonmalignant process such as  mycobacterium avium intracellular (MAI) would need to be a diagnosis of exclusion.  Mild prominence of right hilar lymph nodes.  Coronary artery calcifications.  Atherosclerotic type changes thoracic aorta and great vessels. Ectatic ascending thoracic aorta measures up to 3.5 cm.  Degenerative changes thoracic and upper lumbar spine and right sternoclavicular junction as noted above.  These results will be called to the ordering clinician or representative by the Radiologist Assistant, and communication documented in the PACS or zVision Dashboard.   09/27/2001 Pathology.  ONCOLOGY TABLE-LUNG  1. Maximum tumor size (cm): 0.7 cm 2. Cytology: No 3. Tumor location: Left upper lobe 4. Histology: Adenocarcinoma 5. Grade: Well differentiated 6. Bronchial margin: Free of tumor 7. Pleural involvement: No 8. Vascular invasion: Not identified 9. Lymph nodes: # examined - 4; # N1 nodes positive - 0; # N2 nodes positive - 0 10. TNM code: pT1, pN0, pMX 11. Non-neoplastic lung: Emphysema (EAA:mw 09-28-01)  AMENDMENT: Additional information provided by Dr. Ralene Ok showed that this patient has a history of prostatic carcinoma, status-post prostatectomy and irradiation. Stains for Prostate Specific Antigen are performed at [place]' s request and are positive indicating that this lung lesion is metastatic prostatic adenocarcinoma. PSA control stained appropriately.  80 year old gentleman with the following issues:   1. Castration resistant metastatic prostate cancer with disease to the bone. His initial diagnosis was in 1995 status post prostatectomy and subsequently developed biochemical relapse treated with salvage radiation therapy.  He developed lung recurrence in 2003 and underwent surgical resection and the pathology reviewed above. Most recently his PSA was up to 31.5 with a doubling time of less then 6 months. CT scan of the chest on 06/11/2014 was  also reviewed.   Options of treatment were discussed including Xtandi, Zytiga, systemic chemotherapy as well as observation. Given these findings of a rapid PSA increase as well as potential recurrence of his lung metastasis we need to proceed with therapy. I think he is a marginal candidate for systemic chemotherapy and for that reason I think [name] will be a better option. Risks and benefits of this drug was discussed. Literature and information about this drug was given to the patient today. Complications include fatigue, tiredness, lower extremity edema and rarely seizures were discussed and he is ready to proceed. We will get him started in the near future and bring him back in 3 weeks to assess for any toxicities.  2. Androgen depravation: Have recommended he continues Lupron which he has been receiving every 6 months.   3. Bone directed therapy: I agree with Dr. Lynne Logan assessment that he would be a candidate for Xgevae once he obtains dental clearance.    4. Pulmonary nodules: Unclear etiology at this time but certainly suspicious for prostatic metastasis to the lung. The lung nodules are rather small will be difficult to biopsy. I think the most reasonable  approach is to treat his prostate cancer and repeat imaging studies in about 3 months. If these pulmonary nodules are decreasing then we might have the answer at that time. If these lung nodules are increasing despite a decline in his PSA, then we will consider a biopsy at that time.  5. Follow-up: Will be in 4 weeks to assess for toxicity of Xtandi.    [facility], MD 12/4/20159:29 AM

## 2014-06-25 DIAGNOSIS — M25661 Stiffness of right knee, not elsewhere classified: Secondary | ICD-10-CM | POA: Diagnosis not present

## 2014-06-25 DIAGNOSIS — M25561 Pain in right knee: Secondary | ICD-10-CM | POA: Diagnosis not present

## 2014-06-25 DIAGNOSIS — R2689 Other abnormalities of gait and mobility: Secondary | ICD-10-CM | POA: Diagnosis not present

## 2014-06-25 DIAGNOSIS — Z96651 Presence of right artificial knee joint: Secondary | ICD-10-CM | POA: Diagnosis not present

## 2014-06-26 ENCOUNTER — Encounter: Payer: Self-pay | Admitting: Oncology

## 2014-06-26 NOTE — Progress Notes (Signed)
Called and left a message for Justin Porter about xtandi assistance; patient does not have prescription coverage

## 2014-06-27 DIAGNOSIS — R2689 Other abnormalities of gait and mobility: Secondary | ICD-10-CM | POA: Diagnosis not present

## 2014-06-27 DIAGNOSIS — Z96651 Presence of right artificial knee joint: Secondary | ICD-10-CM | POA: Diagnosis not present

## 2014-06-27 DIAGNOSIS — M25561 Pain in right knee: Secondary | ICD-10-CM | POA: Diagnosis not present

## 2014-06-27 DIAGNOSIS — M25661 Stiffness of right knee, not elsewhere classified: Secondary | ICD-10-CM | POA: Diagnosis not present

## 2014-06-29 DIAGNOSIS — R2689 Other abnormalities of gait and mobility: Secondary | ICD-10-CM | POA: Diagnosis not present

## 2014-06-29 DIAGNOSIS — M25561 Pain in right knee: Secondary | ICD-10-CM | POA: Diagnosis not present

## 2014-06-29 DIAGNOSIS — M25661 Stiffness of right knee, not elsewhere classified: Secondary | ICD-10-CM | POA: Diagnosis not present

## 2014-06-29 DIAGNOSIS — Z96651 Presence of right artificial knee joint: Secondary | ICD-10-CM | POA: Diagnosis not present

## 2014-07-02 DIAGNOSIS — Z96651 Presence of right artificial knee joint: Secondary | ICD-10-CM | POA: Diagnosis not present

## 2014-07-02 DIAGNOSIS — R2689 Other abnormalities of gait and mobility: Secondary | ICD-10-CM | POA: Diagnosis not present

## 2014-07-02 DIAGNOSIS — M25561 Pain in right knee: Secondary | ICD-10-CM | POA: Diagnosis not present

## 2014-07-02 DIAGNOSIS — M25661 Stiffness of right knee, not elsewhere classified: Secondary | ICD-10-CM | POA: Diagnosis not present

## 2014-07-03 ENCOUNTER — Telehealth: Payer: Self-pay | Admitting: *Deleted

## 2014-07-03 DIAGNOSIS — Z96651 Presence of right artificial knee joint: Secondary | ICD-10-CM | POA: Diagnosis not present

## 2014-07-03 DIAGNOSIS — M25552 Pain in left hip: Secondary | ICD-10-CM | POA: Diagnosis not present

## 2014-07-03 DIAGNOSIS — Z471 Aftercare following joint replacement surgery: Secondary | ICD-10-CM | POA: Diagnosis not present

## 2014-07-03 NOTE — Telephone Encounter (Addendum)
PT.'S WIFE WILL SEE THE FINANCIAL COUNSELOR TODAY. ENCOURAGED PT.'S WIFE TO KEEP A NOTEBOOK FOR INFORMATION AND QUESTIONS CONCERNING HER HUSBAND'S TREATMENT. SHE VOICES UNDERSTANDING.

## 2014-07-04 ENCOUNTER — Encounter: Payer: Self-pay | Admitting: Oncology

## 2014-07-04 DIAGNOSIS — Z96651 Presence of right artificial knee joint: Secondary | ICD-10-CM | POA: Diagnosis not present

## 2014-07-04 DIAGNOSIS — M25661 Stiffness of right knee, not elsewhere classified: Secondary | ICD-10-CM | POA: Diagnosis not present

## 2014-07-04 DIAGNOSIS — R2689 Other abnormalities of gait and mobility: Secondary | ICD-10-CM | POA: Diagnosis not present

## 2014-07-04 DIAGNOSIS — M25561 Pain in right knee: Secondary | ICD-10-CM | POA: Diagnosis not present

## 2014-07-04 NOTE — Progress Notes (Signed)
Faxed xtandi patient assistance application to [employer]

## 2014-07-05 ENCOUNTER — Encounter: Payer: Self-pay | Admitting: Oncology

## 2014-07-05 NOTE — Progress Notes (Signed)
Patient is approved for free xtandi from 07/05/14-07/19/14; I will reapply on 07/19/14, they want patient to get Medicare Part D

## 2014-07-06 DIAGNOSIS — R2689 Other abnormalities of gait and mobility: Secondary | ICD-10-CM | POA: Diagnosis not present

## 2014-07-06 DIAGNOSIS — M25561 Pain in right knee: Secondary | ICD-10-CM | POA: Diagnosis not present

## 2014-07-06 DIAGNOSIS — M25661 Stiffness of right knee, not elsewhere classified: Secondary | ICD-10-CM | POA: Diagnosis not present

## 2014-07-06 DIAGNOSIS — Z96651 Presence of right artificial knee joint: Secondary | ICD-10-CM | POA: Diagnosis not present

## 2014-07-09 NOTE — Progress Notes (Signed)
Incoming fax from [facility] (Ph [phone]) that [name] was dispensed on 07/05/14.

## 2014-07-11 ENCOUNTER — Encounter: Payer: Self-pay | Admitting: *Deleted

## 2014-07-11 DIAGNOSIS — Z96651 Presence of right artificial knee joint: Secondary | ICD-10-CM | POA: Diagnosis not present

## 2014-07-11 DIAGNOSIS — M25661 Stiffness of right knee, not elsewhere classified: Secondary | ICD-10-CM | POA: Diagnosis not present

## 2014-07-11 DIAGNOSIS — M25561 Pain in right knee: Secondary | ICD-10-CM | POA: Diagnosis not present

## 2014-07-11 DIAGNOSIS — R2689 Other abnormalities of gait and mobility: Secondary | ICD-10-CM | POA: Diagnosis not present

## 2014-07-11 NOTE — Progress Notes (Signed)
RECEIVED A FAX FROM [facility] CONCERNING A CONFIRMATION OF PRESCRIPTION SHIPMENT FOR XTANDI ON 07/05/14.

## 2014-07-17 DIAGNOSIS — Z96651 Presence of right artificial knee joint: Secondary | ICD-10-CM | POA: Diagnosis not present

## 2014-07-17 DIAGNOSIS — M25661 Stiffness of right knee, not elsewhere classified: Secondary | ICD-10-CM | POA: Diagnosis not present

## 2014-07-17 DIAGNOSIS — M25561 Pain in right knee: Secondary | ICD-10-CM | POA: Diagnosis not present

## 2014-07-17 DIAGNOSIS — R2689 Other abnormalities of gait and mobility: Secondary | ICD-10-CM | POA: Diagnosis not present

## 2014-07-19 DIAGNOSIS — M25552 Pain in left hip: Secondary | ICD-10-CM | POA: Diagnosis not present

## 2014-07-19 DIAGNOSIS — R2689 Other abnormalities of gait and mobility: Secondary | ICD-10-CM | POA: Diagnosis not present

## 2014-07-19 DIAGNOSIS — Z96651 Presence of right artificial knee joint: Secondary | ICD-10-CM | POA: Diagnosis not present

## 2014-07-19 DIAGNOSIS — M25561 Pain in right knee: Secondary | ICD-10-CM | POA: Diagnosis not present

## 2014-07-19 DIAGNOSIS — M25661 Stiffness of right knee, not elsewhere classified: Secondary | ICD-10-CM | POA: Diagnosis not present

## 2014-07-23 DIAGNOSIS — M25661 Stiffness of right knee, not elsewhere classified: Secondary | ICD-10-CM | POA: Diagnosis not present

## 2014-07-23 DIAGNOSIS — Z96651 Presence of right artificial knee joint: Secondary | ICD-10-CM | POA: Diagnosis not present

## 2014-07-23 DIAGNOSIS — R2689 Other abnormalities of gait and mobility: Secondary | ICD-10-CM | POA: Diagnosis not present

## 2014-07-23 DIAGNOSIS — M25561 Pain in right knee: Secondary | ICD-10-CM | POA: Diagnosis not present

## 2014-07-25 ENCOUNTER — Ambulatory Visit (HOSPITAL_BASED_OUTPATIENT_CLINIC_OR_DEPARTMENT_OTHER): Payer: Medicare Other | Admitting: Oncology

## 2014-07-25 ENCOUNTER — Telehealth: Payer: Self-pay | Admitting: Oncology

## 2014-07-25 ENCOUNTER — Other Ambulatory Visit (HOSPITAL_BASED_OUTPATIENT_CLINIC_OR_DEPARTMENT_OTHER): Payer: Medicare Other

## 2014-07-25 VITALS — BP 153/61 | HR 90 | Temp 98.1°F | Resp 19 | Ht 72.0 in | Wt 174.8 lb

## 2014-07-25 DIAGNOSIS — R918 Other nonspecific abnormal finding of lung field: Secondary | ICD-10-CM

## 2014-07-25 DIAGNOSIS — C61 Malignant neoplasm of prostate: Secondary | ICD-10-CM

## 2014-07-25 DIAGNOSIS — C7951 Secondary malignant neoplasm of bone: Secondary | ICD-10-CM | POA: Diagnosis not present

## 2014-07-25 LAB — COMPREHENSIVE METABOLIC PANEL (CC13)
ALBUMIN: 3.2 g/dL — AB (ref 3.5–5.0)
ALT: 10 U/L (ref 0–55)
AST: 15 U/L (ref 5–34)
Alkaline Phosphatase: 112 U/L (ref 40–150)
Anion Gap: 10 mEq/L (ref 3–11)
BUN: 8.8 mg/dL (ref 7.0–26.0)
CHLORIDE: 96 meq/L — AB (ref 98–109)
CO2: 30 mEq/L — ABNORMAL HIGH (ref 22–29)
Calcium: 9.5 mg/dL (ref 8.4–10.4)
Creatinine: 0.8 mg/dL (ref 0.7–1.3)
EGFR: 82 mL/min/{1.73_m2} — AB (ref >90)
GLUCOSE: 97 mg/dL (ref 70–140)
Potassium: 4.4 mEq/L (ref 3.5–5.1)
Sodium: 136 mEq/L (ref 136–145)
Total Bilirubin: 0.39 mg/dL (ref 0.20–1.20)
Total Protein: 7.2 g/dL (ref 6.4–8.3)

## 2014-07-25 LAB — CBC WITH DIFFERENTIAL/PLATELET
BASO%: 0.9 % (ref 0.0–2.0)
BASOS ABS: 0.1 10*3/uL (ref 0.0–0.1)
EOS ABS: 0.2 10*3/uL (ref 0.0–0.5)
EOS%: 1.7 % (ref 0.0–7.0)
HEMATOCRIT: 37 % — AB (ref 38.4–49.9)
HGB: 12 g/dL — ABNORMAL LOW (ref 13.0–17.1)
LYMPH%: 14.8 % (ref 14.0–49.0)
MCH: 29.4 pg (ref 27.2–33.4)
MCHC: 32.5 g/dL (ref 32.0–36.0)
MCV: 90.7 fL (ref 79.3–98.0)
MONO#: 0.6 10*3/uL (ref 0.1–0.9)
MONO%: 7.1 % (ref 0.0–14.0)
NEUT%: 75.5 % — AB (ref 39.0–75.0)
NEUTROS ABS: 6.7 10*3/uL — AB (ref 1.5–6.5)
Platelets: 457 10*3/uL — ABNORMAL HIGH (ref 140–400)
RBC: 4.08 10*6/uL — ABNORMAL LOW (ref 4.20–5.82)
RDW: 13.2 % (ref 11.0–14.6)
WBC: 8.8 10*3/uL (ref 4.0–10.3)
lymph#: 1.3 10*3/uL (ref 0.9–3.3)

## 2014-07-25 NOTE — Telephone Encounter (Signed)
Gave avs & cal for Feb. °

## 2014-07-25 NOTE — Progress Notes (Signed)
Hematology and Oncology Follow Up Visit  Justin Porter [id number] [birth year] 81 y.o. 07/25/2014 12:51 PM [name], MDHusain, [name], MD   Principle Diagnosis: 81 year old gentleman with Castration resistant metastatic prostate cancer with disease to the bone. His initial diagnosis was in 1995.  Prior Therapy:  He is status post prostatectomy and subsequently developed biochemical relapse treated with salvage radiation therapy. He did develop presumed pulmonary metastasis status post surgical resection in 2003. The pathology confirmed the presence of adenocarcinoma from a prostate etiology. He has been on androgen depravation initially intermittently and subsequently continuously since 2013. Most recently he developed a rise in his PSA with a doubling time less than 6 months. He also has bony metastasis.    Current therapy:  Androgen deprivation.  Xtandi started and December 2015.   Interim History:  [name] presents today for a followup visit with his wife. Since the last visit, he started [place] without any major complications. He did not report any excessive fatigue or tiredness. He did not report any GI toxicities and had not reported any decline in his energy her performance status. He continues physical therapy after his knee replacement. His mobility is improved and he resumed most activities of daily living. He does not report any constitutional symptoms of fevers or chills or weight loss. He does not report any neurological symptoms of headaches or blurry vision or syncope. Is not reporting any seizures or dementia. He has not reported any chest pain or palpitation. He has not reported any cough or hemoptysis. He has not reported any nausea or vomiting or abdominal pain. He does not report any frequency urgency or hesitancy or hematuria. He does not report any lymphadenopathy or petechiae. He does not report any skin rashes or lesions. He does not report any depression or side he  does report hearing loss. Rest of his review of systems unremarkable.      Medications: I have reviewed the patient's current medications.  Current Outpatient Prescriptions  Medication Sig Dispense Refill  . calcium citrate-vitamin D (CITRACAL+D) 315-200 MG-UNIT per tablet Take 1 tablet by mouth every morning.     . docusate sodium 100 MG CAPS Take 100 mg by mouth 2 (two) times daily. 10 capsule 0  . enzalutamide (XTANDI) 40 MG capsule Take 4 capsules (160 mg total) by mouth daily. 120 capsule 0  . ferrous sulfate 325 (65 FE) MG tablet Take 1 tablet (325 mg total) by mouth 3 (three) times daily after meals.  3  . furosemide (LASIX) 20 MG tablet Take 20 mg by mouth 2 (two) times daily.    Marland Kitchen HYDROcodone-acetaminophen (NORCO) 7.5-325 MG per tablet Take 1-2 tablets by mouth every 4 (four) hours as needed for moderate pain. 100 tablet 0  . Multiple Vitamin (MULITIVITAMIN WITH MINERALS) TABS Take 1 tablet by mouth every morning.    . Omega-3 Fatty Acids (FISH OIL) 1200 MG CAPS Take 1 capsule by mouth every morning.     Marland Kitchen OVER THE COUNTER MEDICATION Take 1 tablet by mouth 2 (two) times daily. Energy Plus    . polyethylene glycol (MIRALAX / GLYCOLAX) packet Take 17 g by mouth 2 (two) times daily. 14 each 0  . Potassium 99 MG TABS Take 1 tablet by mouth every morning.    . simvastatin (ZOCOR) 20 MG tablet Take 20 mg by mouth every evening.     Marland Kitchen tiZANidine (ZANAFLEX) 4 MG tablet Take 1 tablet (4 mg total) by mouth every 6 (six) hours as needed for  muscle spasms. 30 tablet 0  . vitamin E 1000 UNIT capsule Take 1,000 Units by mouth every morning.     No current facility-administered medications for this visit.     Allergies:  Allergies  Allergen Reactions  . Codeine Nausea And Vomiting  . Shrimp [Shellfish Allergy] Nausea And Vomiting  . Betadine [Povidone Iodine] Rash    Past Medical History, Surgical history, Social history, and Family History were reviewed and updated.   Physical  Exam: Blood pressure 153/61, pulse 90, temperature 98.1 F (36.7 C), temperature source Oral, resp. rate 19, height 6' (1.829 m), weight 174 lb 12.8 oz (79.289 kg), SpO2 100 %. ECOG: 1 General appearance: alert and cooperative Head: Normocephalic, without obvious abnormality Neck: no adenopathy Lymph nodes: Cervical, supraclavicular, and axillary nodes normal. Heart:regular rate and rhythm, S1, S2 normal, no murmur, click, rub or gallop Lung:chest clear, no wheezing, rales, normal symmetric air entry Abdomin: soft, non-tender, without masses or organomegaly EXT: 1+ edema.   Lab Results: Lab Results  Component Value Date   WBC 8.8 07/25/2014   HGB 12.0* 07/25/2014   HCT 37.0* 07/25/2014   MCV 90.7 07/25/2014   PLT 457* 07/25/2014     Chemistry      Component Value Date/Time   NA 139 06/18/2014 1050   NA 142 05/23/2014 0423   K 4.2 06/18/2014 1050   K 4.2 05/23/2014 0423   CL 102 05/23/2014 0423   CO2 31* 06/18/2014 1050   CO2 26 05/23/2014 0423   BUN 12.0 06/18/2014 1050   BUN 19 05/23/2014 0423   CREATININE 0.8 06/18/2014 1050   CREATININE 0.82 05/23/2014 0423   CREATININE 0.75 10/29/2011 1545      Component Value Date/Time   CALCIUM 9.5 06/18/2014 1050   CALCIUM 8.7 05/23/2014 0423   ALKPHOS 123 06/18/2014 1050   ALKPHOS 83 12/14/2011 1044   AST 18 06/18/2014 1050   AST 21 12/14/2011 1044   ALT 10 06/18/2014 1050   ALT 25 12/14/2011 1044   BILITOT 0.46 06/18/2014 1050   BILITOT 0.4 12/14/2011 5121       81 year old gentleman with the following issues:   1. Castration resistant metastatic prostate cancer with disease to the bone. His initial diagnosis was in 1995 status post prostatectomy and subsequently developed biochemical relapse treated with salvage radiation therapy.  He developed lung recurrence in 2003 and underwent surgical resection and the pathology reviewed above. Most recently his PSA was up to 31.5 with a doubling time of less then 6 months. CT  scan of the chest on 06/11/2014 showed disease relapse with a PSA up to 31.  He was started on Xtandi in December 2015 and have tolerated it well. The plan is to continue with the current dose and schedule and follow-up PSA on a monthly basis.  2. Androgen depravation: Have recommended he continues Lupron which he has been receiving every 6 months. I will check his testosterone level was similar and assess whether he needs another Lupron injection.  3. Bone directed therapy: He will likely need bone directed therapy in the form of Xgeva or Zometa after dental clearance.  4. Pulmonary nodules: Unclear etiology at this time but certainly suspicious for prostatic metastasis to the lung. The lung nodules are rather small will be difficult to biopsy. I think the most reasonable approach is to treat his prostate cancer and repeat imaging studies in about 3 months.  5. Follow-up: Will be in 4 weeks to assess for toxicity of Xtandi.  Zola Button, MD 1/6/201612:51 PM

## 2014-07-26 ENCOUNTER — Telehealth: Payer: Self-pay | Admitting: *Deleted

## 2014-07-26 LAB — PSA: PSA: 6.96 ng/mL — ABNORMAL HIGH (ref <=4.00)

## 2014-07-26 NOTE — Telephone Encounter (Signed)
-----   Message from Wyatt Portela, MD sent at 07/26/2014 12:16 PM EST ----- Please call his PSA.

## 2014-07-26 NOTE — Telephone Encounter (Signed)
Spoke with wife, gave results of PSA done yesterday

## 2014-07-27 DIAGNOSIS — M25561 Pain in right knee: Secondary | ICD-10-CM | POA: Diagnosis not present

## 2014-07-27 DIAGNOSIS — R2689 Other abnormalities of gait and mobility: Secondary | ICD-10-CM | POA: Diagnosis not present

## 2014-07-27 DIAGNOSIS — M25661 Stiffness of right knee, not elsewhere classified: Secondary | ICD-10-CM | POA: Diagnosis not present

## 2014-07-27 DIAGNOSIS — Z96651 Presence of right artificial knee joint: Secondary | ICD-10-CM | POA: Diagnosis not present

## 2014-07-31 DIAGNOSIS — M25661 Stiffness of right knee, not elsewhere classified: Secondary | ICD-10-CM | POA: Diagnosis not present

## 2014-07-31 DIAGNOSIS — Z96651 Presence of right artificial knee joint: Secondary | ICD-10-CM | POA: Diagnosis not present

## 2014-07-31 DIAGNOSIS — M25561 Pain in right knee: Secondary | ICD-10-CM | POA: Diagnosis not present

## 2014-07-31 DIAGNOSIS — R2689 Other abnormalities of gait and mobility: Secondary | ICD-10-CM | POA: Diagnosis not present

## 2014-08-02 ENCOUNTER — Encounter: Payer: Self-pay | Admitting: Oncology

## 2014-08-02 NOTE — Progress Notes (Signed)
Patient is approved for xtandi copay assistance through [employer] from 08/02/14-08/02/15; informed patient's wife

## 2014-08-03 DIAGNOSIS — Z96651 Presence of right artificial knee joint: Secondary | ICD-10-CM | POA: Diagnosis not present

## 2014-08-03 DIAGNOSIS — R2689 Other abnormalities of gait and mobility: Secondary | ICD-10-CM | POA: Diagnosis not present

## 2014-08-03 DIAGNOSIS — M25661 Stiffness of right knee, not elsewhere classified: Secondary | ICD-10-CM | POA: Diagnosis not present

## 2014-08-03 DIAGNOSIS — M25561 Pain in right knee: Secondary | ICD-10-CM | POA: Diagnosis not present

## 2014-08-06 DIAGNOSIS — R2689 Other abnormalities of gait and mobility: Secondary | ICD-10-CM | POA: Diagnosis not present

## 2014-08-06 DIAGNOSIS — Z96651 Presence of right artificial knee joint: Secondary | ICD-10-CM | POA: Diagnosis not present

## 2014-08-06 DIAGNOSIS — M25661 Stiffness of right knee, not elsewhere classified: Secondary | ICD-10-CM | POA: Diagnosis not present

## 2014-08-06 DIAGNOSIS — M25561 Pain in right knee: Secondary | ICD-10-CM | POA: Diagnosis not present

## 2014-08-09 DIAGNOSIS — R2689 Other abnormalities of gait and mobility: Secondary | ICD-10-CM | POA: Diagnosis not present

## 2014-08-09 DIAGNOSIS — Z96651 Presence of right artificial knee joint: Secondary | ICD-10-CM | POA: Diagnosis not present

## 2014-08-09 DIAGNOSIS — M25561 Pain in right knee: Secondary | ICD-10-CM | POA: Diagnosis not present

## 2014-08-09 DIAGNOSIS — M25661 Stiffness of right knee, not elsewhere classified: Secondary | ICD-10-CM | POA: Diagnosis not present

## 2014-08-13 ENCOUNTER — Encounter: Payer: Self-pay | Admitting: Oncology

## 2014-08-13 DIAGNOSIS — Z96651 Presence of right artificial knee joint: Secondary | ICD-10-CM | POA: Diagnosis not present

## 2014-08-13 DIAGNOSIS — M25661 Stiffness of right knee, not elsewhere classified: Secondary | ICD-10-CM | POA: Diagnosis not present

## 2014-08-13 DIAGNOSIS — R2689 Other abnormalities of gait and mobility: Secondary | ICD-10-CM | POA: Diagnosis not present

## 2014-08-13 DIAGNOSIS — M25561 Pain in right knee: Secondary | ICD-10-CM | POA: Diagnosis not present

## 2014-08-13 NOTE — Progress Notes (Signed)
Patient has been approved for free xtandi from 08/08/14-07/20/15

## 2014-08-16 DIAGNOSIS — Z471 Aftercare following joint replacement surgery: Secondary | ICD-10-CM | POA: Diagnosis not present

## 2014-08-16 DIAGNOSIS — Z96651 Presence of right artificial knee joint: Secondary | ICD-10-CM | POA: Diagnosis not present

## 2014-08-17 DIAGNOSIS — Z96651 Presence of right artificial knee joint: Secondary | ICD-10-CM | POA: Diagnosis not present

## 2014-08-17 DIAGNOSIS — M25561 Pain in right knee: Secondary | ICD-10-CM | POA: Diagnosis not present

## 2014-08-17 DIAGNOSIS — M25661 Stiffness of right knee, not elsewhere classified: Secondary | ICD-10-CM | POA: Diagnosis not present

## 2014-08-17 DIAGNOSIS — R2689 Other abnormalities of gait and mobility: Secondary | ICD-10-CM | POA: Diagnosis not present

## 2014-08-22 ENCOUNTER — Telehealth: Payer: Self-pay | Admitting: Oncology

## 2014-08-22 ENCOUNTER — Ambulatory Visit (HOSPITAL_BASED_OUTPATIENT_CLINIC_OR_DEPARTMENT_OTHER): Payer: Medicare Other | Admitting: Physician Assistant

## 2014-08-22 ENCOUNTER — Encounter: Payer: Self-pay | Admitting: Physician Assistant

## 2014-08-22 ENCOUNTER — Other Ambulatory Visit (HOSPITAL_BASED_OUTPATIENT_CLINIC_OR_DEPARTMENT_OTHER): Payer: Medicare Other

## 2014-08-22 VITALS — BP 145/61 | HR 78 | Temp 98.6°F | Resp 18 | Ht 72.0 in | Wt 175.8 lb

## 2014-08-22 DIAGNOSIS — R911 Solitary pulmonary nodule: Secondary | ICD-10-CM

## 2014-08-22 DIAGNOSIS — C7951 Secondary malignant neoplasm of bone: Secondary | ICD-10-CM | POA: Diagnosis not present

## 2014-08-22 DIAGNOSIS — C61 Malignant neoplasm of prostate: Secondary | ICD-10-CM

## 2014-08-22 DIAGNOSIS — E291 Testicular hypofunction: Secondary | ICD-10-CM

## 2014-08-22 LAB — COMPREHENSIVE METABOLIC PANEL (CC13)
ALT: 9 U/L (ref 0–55)
AST: 13 U/L (ref 5–34)
Albumin: 3.5 g/dL (ref 3.5–5.0)
Alkaline Phosphatase: 119 U/L (ref 40–150)
Anion Gap: 9 mEq/L (ref 3–11)
BUN: 11.8 mg/dL (ref 7.0–26.0)
CHLORIDE: 99 meq/L (ref 98–109)
CO2: 31 meq/L — AB (ref 22–29)
Calcium: 8.9 mg/dL (ref 8.4–10.4)
Creatinine: 0.9 mg/dL (ref 0.7–1.3)
EGFR: 80 mL/min/{1.73_m2} — ABNORMAL LOW (ref >90)
Glucose: 94 mg/dl (ref 70–140)
POTASSIUM: 4.1 meq/L (ref 3.5–5.1)
Sodium: 139 mEq/L (ref 136–145)
TOTAL PROTEIN: 7.2 g/dL (ref 6.4–8.3)
Total Bilirubin: 0.32 mg/dL (ref 0.20–1.20)

## 2014-08-22 LAB — CBC WITH DIFFERENTIAL/PLATELET
BASO%: 1.2 % (ref 0.0–2.0)
BASOS ABS: 0.1 10*3/uL (ref 0.0–0.1)
EOS%: 2.6 % (ref 0.0–7.0)
Eosinophils Absolute: 0.1 10*3/uL (ref 0.0–0.5)
HEMATOCRIT: 39.3 % (ref 38.4–49.9)
HGB: 12.5 g/dL — ABNORMAL LOW (ref 13.0–17.1)
LYMPH%: 21.6 % (ref 14.0–49.0)
MCH: 28.4 pg (ref 27.2–33.4)
MCHC: 31.7 g/dL — AB (ref 32.0–36.0)
MCV: 89.4 fL (ref 79.3–98.0)
MONO#: 0.5 10*3/uL (ref 0.1–0.9)
MONO%: 8.6 % (ref 0.0–14.0)
NEUT%: 66 % (ref 39.0–75.0)
NEUTROS ABS: 3.7 10*3/uL (ref 1.5–6.5)
PLATELETS: 296 10*3/uL (ref 140–400)
RBC: 4.4 10*6/uL (ref 4.20–5.82)
RDW: 14.6 % (ref 11.0–14.6)
WBC: 5.5 10*3/uL (ref 4.0–10.3)
lymph#: 1.2 10*3/uL (ref 0.9–3.3)

## 2014-08-22 NOTE — Telephone Encounter (Signed)
gv pt wife avs report and appt for 3/9 - date per pt. pt needs wednesdays afternoons and there is no availability 3/2. per pt request appt scheduled for 3/9.

## 2014-08-22 NOTE — Progress Notes (Signed)
Hematology and Oncology Follow Up Visit  Justin Porter [id number] [birth year] 81 y.o. 08/22/2014 4:40 PM Justin Porter, MDHusain, Justin Ar, MD   Principle Diagnosis: 81 year old gentleman with Castration resistant metastatic prostate cancer with disease to the bone. His initial diagnosis was in 1995.  Prior Therapy:  He is status post prostatectomy and subsequently developed biochemical relapse treated with salvage radiation therapy. He did develop presumed pulmonary metastasis status post surgical resection in 2003. The pathology confirmed the presence of adenocarcinoma from a prostate etiology. He has been on androgen depravation initially intermittently and subsequently continuously since 2013. Most recently he developed a rise in his PSA with a doubling time less than 6 months. He also has bony metastasis.    Current therapy:  Androgen deprivation.  Xtandi started and December 2015.   Interim History:  Justin Porter presents today for a followup visit with his wife. Since the last visit, he started [place] without any major complications. He does not report any excessive fatigue or tiredness. He does not report any GI toxicities and has not reported any decline in his energy her performance status. He continues physical therapy after his knee replacement. His mobility is improved and he resumed most activities of daily living. He had MRI through [facility] of his left hip which revealed findings compatible with metastasis to the left superior pubic ramus, pubic body and medial inferior pubic ramus with a camping soft tissue mass and pathologic fracture of the medial left superior pubic gram of some pubic body. Corresponding sclerosis on radiograph suggested lesion is likely a prior prostate carcinoma metastasis. Findings compatible with metastasis to the right superior pubic ramus. Left total hip arthroplasty with accompanying small joint effusion. His high-grade partial tear left  hamstring tendons complex initial attachment. This study was performed on 07/19/2014. A copy of this report will be scanned into the patient's record. The study was ordered by Dr. Paralee Porter.  Patient's wife was confused as to how to get his [name] refilled and as a result he was without medication for approximately one week. He is back on treatment and continues to tolerate it without difficulty. He does not report any constitutional symptoms of fevers or chills or weight loss. He does not report any neurological symptoms of headaches or blurry vision or syncope. Is not reporting any seizures or dementia. He has not reported any chest pain or palpitation. He has not reported any cough or hemoptysis. He has not reported any nausea or vomiting or abdominal pain. He does not report any frequency urgency or hesitancy or hematuria. He does not report any lymphadenopathy or petechiae. He does not report any skin rashes or lesions. He does not report any depression or side he does report hearing loss. Remainder of his review of systems unremarkable.      Medications: I have reviewed the patient's current medications.  Current Outpatient Prescriptions  Medication Sig Dispense Refill  . calcium citrate-vitamin D (CITRACAL+D) 315-200 MG-UNIT per tablet Take 1 tablet by mouth every morning.     . docusate sodium 100 MG CAPS Take 100 mg by mouth 2 (two) times daily. 10 capsule 0  . enzalutamide (XTANDI) 40 MG capsule Take 4 capsules (160 mg total) by mouth daily. 120 capsule 0  . furosemide (LASIX) 20 MG tablet Take 20 mg by mouth 2 (two) times daily.    . Multiple Vitamin (MULITIVITAMIN WITH MINERALS) TABS Take 1 tablet by mouth every morning.    . Omega-3 Fatty Acids (FISH OIL) 1200 MG  CAPS Take 1 capsule by mouth every morning.     Marland Kitchen OVER THE COUNTER MEDICATION Take 1 tablet by mouth 2 (two) times daily. Energy Plus    . polyethylene glycol (MIRALAX / GLYCOLAX) packet Take 17 g by mouth 2 (two) times  daily. 14 each 0  . Potassium 99 MG TABS Take 1 tablet by mouth every morning.    . simvastatin (ZOCOR) 20 MG tablet Take 20 mg by mouth every evening.     . vitamin E 1000 UNIT capsule Take 1,000 Units by mouth every morning.    . ferrous sulfate 325 (65 FE) MG tablet Take 1 tablet (325 mg total) by mouth 3 (three) times daily after meals. (Patient not taking: Reported on 08/22/2014)  3  . HYDROcodone-acetaminophen (NORCO) 7.5-325 MG per tablet Take 1-2 tablets by mouth every 4 (four) hours as needed for moderate pain. (Patient not taking: Reported on 08/22/2014) 100 tablet 0  . tiZANidine (ZANAFLEX) 4 MG tablet Take 1 tablet (4 mg total) by mouth every 6 (six) hours as needed for muscle spasms. (Patient not taking: Reported on 08/22/2014) 30 tablet 0   No current facility-administered medications for this visit.     Allergies:  Allergies  Allergen Reactions  . Codeine Nausea And Vomiting  . Shrimp [Shellfish Allergy] Nausea And Vomiting  . Betadine [Povidone Iodine] Rash    Past Medical History, Surgical history, Social history, and Family History were reviewed and updated.   Physical Exam: Blood pressure 145/61, pulse 78, temperature 98.6 F (37 C), temperature source Oral, resp. rate 18, height 6' (1.829 m), weight 175 lb 12.8 oz (79.742 kg), SpO2 99 %. ECOG: 1 General appearance: alert and cooperative Head: Normocephalic, without obvious abnormality Neck: no adenopathy Lymph nodes: Cervical, supraclavicular, and axillary nodes normal. Heart:regular rate and rhythm, S1, S2 normal, no murmur, click, rub or gallop Lung:chest clear, no wheezing, rales, normal symmetric air entry Abdomin: soft, non-tender, without masses or organomegaly EXT: 1+ edema bilateral lower extremities.   Lab Results: Lab Results  Component Value Date   WBC 5.5 08/22/2014   HGB 12.5* 08/22/2014   HCT 39.3 08/22/2014   MCV 89.4 08/22/2014   PLT 296 08/22/2014     Chemistry      Component Value  Date/Time   NA 139 08/22/2014 1320   NA 142 05/23/2014 0423   K 4.1 08/22/2014 1320   K 4.2 05/23/2014 0423   CL 102 05/23/2014 0423   CO2 31* 08/22/2014 1320   CO2 26 05/23/2014 0423   BUN 11.8 08/22/2014 1320   BUN 19 05/23/2014 0423   CREATININE 0.9 08/22/2014 1320   CREATININE 0.82 05/23/2014 0423   CREATININE 0.75 10/29/2011 1545      Component Value Date/Time   CALCIUM 8.9 08/22/2014 1320   CALCIUM 8.7 05/23/2014 0423   ALKPHOS 119 08/22/2014 1320   ALKPHOS 83 12/14/2011 1044   AST 13 08/22/2014 1320   AST 21 12/14/2011 1044   ALT 9 08/22/2014 1320   ALT 25 12/14/2011 1044   BILITOT 0.32 08/22/2014 1320   BILITOT 0.4 12/14/2011 3471       81 year old gentleman with the following issues:   1. Castration resistant metastatic prostate cancer with disease to the bone. His initial diagnosis was in 1995 status post prostatectomy and subsequently developed biochemical relapse treated with salvage radiation therapy.  He developed lung recurrence in 2003 and underwent surgical resection and the pathology reviewed above. Most recently his PSA was up to 31.5 with  a doubling time of less then 6 months. CT scan of the chest on 06/11/2014 showed disease relapse with a PSA up to 31. PSA from 07/25/2014 was 6.96 which represents a good response and nice reduction in the PSA level. PSA from today's visit is pending.  He was started on Xtandi in December 2015 and have tolerated it well. The plan is to continue with the current dose and schedule and follow-up PSA on a monthly basis.  2. Androgen depravation: Have recommended he continues Lupron which he has been receiving every 6 months. I will check his testosterone level was similar and assess whether he needs another Lupron injection.  3. Bone directed therapy: He will likely need bone directed therapy in the form of Xgeva or Zometa after dental clearance. Given the results of the MRI of the form by Dr. Ihor Gully that revealed some suspected  prostate bone metastasis we may need to start bone directed therapy in the near future. This be discussed at a later date.  4. Pulmonary nodules: Unclear etiology at this time but certainly suspicious for prostatic metastasis to the lung. The lung nodules are rather small will be difficult to biopsy. Dr. Hazeline Junker approach is to treat his prostate cancer and repeat imaging studies in about 2 months.  5. Follow-up: Will be in 4 weeks to assess for toxicity of Xtandi.    Carlton Adam, PA-C  2/3/20164:40 PM

## 2014-08-22 NOTE — Patient Instructions (Signed)
Continue Xtandi at the current dose Follow up in one month 

## 2014-08-23 LAB — PSA: PSA: 3.03 ng/mL (ref <=4.00)

## 2014-08-29 DIAGNOSIS — I10 Essential (primary) hypertension: Secondary | ICD-10-CM | POA: Diagnosis not present

## 2014-08-29 DIAGNOSIS — L03119 Cellulitis of unspecified part of limb: Secondary | ICD-10-CM | POA: Diagnosis not present

## 2014-08-29 DIAGNOSIS — R600 Localized edema: Secondary | ICD-10-CM | POA: Diagnosis not present

## 2014-08-29 DIAGNOSIS — E780 Pure hypercholesterolemia: Secondary | ICD-10-CM | POA: Diagnosis not present

## 2014-08-30 DIAGNOSIS — I8311 Varicose veins of right lower extremity with inflammation: Secondary | ICD-10-CM | POA: Diagnosis not present

## 2014-08-30 DIAGNOSIS — I8312 Varicose veins of left lower extremity with inflammation: Secondary | ICD-10-CM | POA: Diagnosis not present

## 2014-08-30 DIAGNOSIS — Z85828 Personal history of other malignant neoplasm of skin: Secondary | ICD-10-CM | POA: Diagnosis not present

## 2014-09-05 DIAGNOSIS — I8310 Varicose veins of unspecified lower extremity with inflammation: Secondary | ICD-10-CM | POA: Diagnosis not present

## 2014-09-05 DIAGNOSIS — I872 Venous insufficiency (chronic) (peripheral): Secondary | ICD-10-CM | POA: Diagnosis not present

## 2014-09-13 DIAGNOSIS — I8311 Varicose veins of right lower extremity with inflammation: Secondary | ICD-10-CM | POA: Diagnosis not present

## 2014-09-13 DIAGNOSIS — D485 Neoplasm of uncertain behavior of skin: Secondary | ICD-10-CM | POA: Diagnosis not present

## 2014-09-13 DIAGNOSIS — D0372 Melanoma in situ of left lower limb, including hip: Secondary | ICD-10-CM | POA: Diagnosis not present

## 2014-09-13 DIAGNOSIS — Z85828 Personal history of other malignant neoplasm of skin: Secondary | ICD-10-CM | POA: Diagnosis not present

## 2014-09-13 DIAGNOSIS — D039 Melanoma in situ, unspecified: Secondary | ICD-10-CM | POA: Diagnosis not present

## 2014-09-13 DIAGNOSIS — I8312 Varicose veins of left lower extremity with inflammation: Secondary | ICD-10-CM | POA: Diagnosis not present

## 2014-09-24 DIAGNOSIS — D0372 Melanoma in situ of left lower limb, including hip: Secondary | ICD-10-CM | POA: Diagnosis not present

## 2014-09-24 DIAGNOSIS — L988 Other specified disorders of the skin and subcutaneous tissue: Secondary | ICD-10-CM | POA: Diagnosis not present

## 2014-09-24 DIAGNOSIS — Z85828 Personal history of other malignant neoplasm of skin: Secondary | ICD-10-CM | POA: Diagnosis not present

## 2014-09-26 ENCOUNTER — Telehealth: Payer: Self-pay | Admitting: Oncology

## 2014-09-26 ENCOUNTER — Ambulatory Visit (HOSPITAL_BASED_OUTPATIENT_CLINIC_OR_DEPARTMENT_OTHER): Payer: Medicare Other | Admitting: Oncology

## 2014-09-26 ENCOUNTER — Other Ambulatory Visit (HOSPITAL_BASED_OUTPATIENT_CLINIC_OR_DEPARTMENT_OTHER): Payer: Medicare Other

## 2014-09-26 VITALS — BP 155/62 | HR 79 | Temp 98.3°F | Resp 18 | Ht 72.0 in | Wt 177.3 lb

## 2014-09-26 DIAGNOSIS — C61 Malignant neoplasm of prostate: Secondary | ICD-10-CM

## 2014-09-26 DIAGNOSIS — C7951 Secondary malignant neoplasm of bone: Secondary | ICD-10-CM | POA: Diagnosis not present

## 2014-09-26 DIAGNOSIS — E291 Testicular hypofunction: Secondary | ICD-10-CM | POA: Diagnosis not present

## 2014-09-26 DIAGNOSIS — R918 Other nonspecific abnormal finding of lung field: Secondary | ICD-10-CM

## 2014-09-26 LAB — COMPREHENSIVE METABOLIC PANEL (CC13)
ALK PHOS: 107 U/L (ref 40–150)
ALT: 9 U/L (ref 0–55)
AST: 13 U/L (ref 5–34)
Albumin: 3.5 g/dL (ref 3.5–5.0)
Anion Gap: 10 mEq/L (ref 3–11)
BUN: 13.3 mg/dL (ref 7.0–26.0)
CO2: 29 mEq/L (ref 22–29)
Calcium: 9.2 mg/dL (ref 8.4–10.4)
Chloride: 101 mEq/L (ref 98–109)
Creatinine: 0.9 mg/dL (ref 0.7–1.3)
EGFR: 80 mL/min/{1.73_m2} — ABNORMAL LOW (ref >90)
Glucose: 113 mg/dl (ref 70–140)
POTASSIUM: 4.2 meq/L (ref 3.5–5.1)
Sodium: 140 mEq/L (ref 136–145)
TOTAL PROTEIN: 6.9 g/dL (ref 6.4–8.3)
Total Bilirubin: 0.3 mg/dL (ref 0.20–1.20)

## 2014-09-26 LAB — CBC WITH DIFFERENTIAL/PLATELET
BASO%: 0.6 % (ref 0.0–2.0)
Basophils Absolute: 0 10*3/uL (ref 0.0–0.1)
EOS ABS: 0.1 10*3/uL (ref 0.0–0.5)
EOS%: 2.1 % (ref 0.0–7.0)
HCT: 39.3 % (ref 38.4–49.9)
HEMOGLOBIN: 12.8 g/dL — AB (ref 13.0–17.1)
LYMPH#: 1.6 10*3/uL (ref 0.9–3.3)
LYMPH%: 30.3 % (ref 14.0–49.0)
MCH: 29 pg (ref 27.2–33.4)
MCHC: 32.6 g/dL (ref 32.0–36.0)
MCV: 88.9 fL (ref 79.3–98.0)
MONO#: 0.5 10*3/uL (ref 0.1–0.9)
MONO%: 9 % (ref 0.0–14.0)
NEUT#: 3.1 10*3/uL (ref 1.5–6.5)
NEUT%: 58 % (ref 39.0–75.0)
PLATELETS: 252 10*3/uL (ref 140–400)
RBC: 4.42 10*6/uL (ref 4.20–5.82)
RDW: 15 % — AB (ref 11.0–14.6)
WBC: 5.4 10*3/uL (ref 4.0–10.3)

## 2014-09-26 NOTE — Progress Notes (Signed)
Hematology and Oncology Follow Up Visit  Justin Porter [id number] Apr 10, 1929 81 y.o. 09/26/2014 3:36 PM [name], MDHusain, [name], MD   Principle Diagnosis: 81 year old gentleman with Castration resistant metastatic prostate cancer with disease to the bone. His initial diagnosis was in 1995.  Prior Therapy:  He is status post prostatectomy and subsequently developed biochemical relapse treated with salvage radiation therapy. He did develop presumed pulmonary metastasis status post surgical resection in 2003. The pathology confirmed the presence of adenocarcinoma from a prostate etiology. He has been on androgen depravation initially intermittently and subsequently continuously since 2013. Most recently he developed a rise in his PSA with a doubling time less than 6 months. He also has bony metastasis.    Current therapy:  Androgen deprivation.  Xtandi started and December 2015.   Interim History:  Justin Porter presents today for a followup visit with his wife. Since the last visit, he continues to be on Xtandi without any major complications. He does not report any excessive fatigue. He does not report any GI toxicities. He does not report any problems with mobility or bone pain. He continues to ambulate without any major difficulty. He does not report any hemoptysis or breathing complaints. He does not report any constitutional symptoms of fevers or chills or weight loss. He does not report any neurological symptoms of headaches or blurry vision or syncope. Is not reporting any seizures or dementia. He has not reported any chest pain or palpitation. He has not reported any cough or hemoptysis. He has not reported any nausea or vomiting or abdominal pain. He does not report any frequency urgency or hesitancy or hematuria. He does not report any lymphadenopathy or petechiae. He does not report any skin rashes or lesions. He does not report any depression or side he does report hearing loss.  Remainder of his review of systems unremarkable.      Medications: I have reviewed the patient's current medications.  Current Outpatient Prescriptions  Medication Sig Dispense Refill  . calcium citrate-vitamin D (CITRACAL+D) 315-200 MG-UNIT per tablet Take 1 tablet by mouth every morning.     . cephALEXin (KEFLEX) 500 MG capsule Take 1 capsule by mouth 3 (three) times daily.  0  . docusate sodium 100 MG CAPS Take 100 mg by mouth 2 (two) times daily. 10 capsule 0  . enzalutamide (XTANDI) 40 MG capsule Take 4 capsules (160 mg total) by mouth daily. 120 capsule 0  . ferrous sulfate 325 (65 FE) MG tablet Take 1 tablet (325 mg total) by mouth 3 (three) times daily after meals.  3  . furosemide (LASIX) 20 MG tablet Take 20 mg by mouth 2 (two) times daily.    Marland Kitchen HYDROcodone-acetaminophen (NORCO) 7.5-325 MG per tablet Take 1-2 tablets by mouth every 4 (four) hours as needed for moderate pain. 100 tablet 0  . Multiple Vitamin (MULITIVITAMIN WITH MINERALS) TABS Take 1 tablet by mouth every morning.    . mupirocin ointment (BACTROBAN) 2 % Apply topically 2 (two) times daily.  0  . Omega-3 Fatty Acids (FISH OIL) 1200 MG CAPS Take 1 capsule by mouth every morning.     Marland Kitchen OVER THE COUNTER MEDICATION Take 1 tablet by mouth 2 (two) times daily. Energy Plus    . polyethylene glycol (MIRALAX / GLYCOLAX) packet Take 17 g by mouth 2 (two) times daily. 14 each 0  . Potassium 99 MG TABS Take 1 tablet by mouth every morning.    . simvastatin (ZOCOR) 20 MG tablet Take 20  mg by mouth every evening.     . vitamin E 1000 UNIT capsule Take 1,000 Units by mouth every morning.    Marland Kitchen tiZANidine (ZANAFLEX) 4 MG tablet Take 1 tablet (4 mg total) by mouth every 6 (six) hours as needed for muscle spasms. (Patient not taking: Reported on 08/22/2014) 30 tablet 0   No current facility-administered medications for this visit.     Allergies:  Allergies  Allergen Reactions  . Codeine Nausea And Vomiting  . Shrimp [Shellfish  Allergy] Nausea And Vomiting  . Betadine [Povidone Iodine] Rash    Past Medical History, Surgical history, Social history, and Family History were reviewed and updated.   Physical Exam: Blood pressure 155/62, pulse 79, temperature 98.3 F (36.8 C), temperature source Oral, resp. rate 18, height 6' (1.829 m), weight 177 lb 4.8 oz (80.423 kg). ECOG: 1 General appearance: alert and cooperative Head: Normocephalic, without obvious abnormality Neck: no adenopathy Lymph nodes: Cervical, supraclavicular, and axillary nodes normal. Heart:regular rate and rhythm, S1, S2 normal, no murmur, click, rub or gallop Lung:chest clear, no wheezing, rales, normal symmetric air entry Abdomin: soft, non-tender, without masses or organomegaly EXT: 1+ edema bilateral lower extremities.   Lab Results: Lab Results  Component Value Date   WBC 5.4 09/26/2014   HGB 12.8* 09/26/2014   HCT 39.3 09/26/2014   MCV 88.9 09/26/2014   PLT 252 09/26/2014     Chemistry      Component Value Date/Time   NA 139 08/22/2014 1320   NA 142 05/23/2014 0423   K 4.1 08/22/2014 1320   K 4.2 05/23/2014 0423   CL 102 05/23/2014 0423   CO2 31* 08/22/2014 1320   CO2 26 05/23/2014 0423   BUN 11.8 08/22/2014 1320   BUN 19 05/23/2014 0423   CREATININE 0.9 08/22/2014 1320   CREATININE 0.82 05/23/2014 0423   CREATININE 0.75 10/29/2011 1545      Component Value Date/Time   CALCIUM 8.9 08/22/2014 1320   CALCIUM 8.7 05/23/2014 0423   ALKPHOS 119 08/22/2014 1320   ALKPHOS 83 12/14/2011 1044   AST 13 08/22/2014 1320   AST 21 12/14/2011 1044   ALT 9 08/22/2014 1320   ALT 25 12/14/2011 1044   BILITOT 0.32 08/22/2014 1320   BILITOT 0.4 12/14/2011 1044     Results for Justin Porter, Justin Porter (MRN [id number]) as of 09/26/2014 15:23  Ref. Range 06/18/2014 10:50 07/25/2014 12:26 08/22/2014 13:20  PSA Latest Range: <=4.00 ng/mL 31.56 (H) 6.96 (H) 50.23    81 year old gentleman with the following issues:   1. Castration resistant  metastatic prostate cancer with disease to the bone. His initial diagnosis was in 1995 status post prostatectomy and subsequently developed biochemical relapse treated with salvage radiation therapy.  He developed lung recurrence in 2003 and underwent surgical resection and the pathology reviewed above. Most recently his PSA was up to 31.5 with a doubling time of less then 6 months. CT scan of the chest on 06/11/2014 showed disease relapse with a PSA up to 31. PSA from 07/25/2014 was 6.96 which represents a good response and nice reduction in the PSA level. PSA from today's visit is pending.  He was started on Xtandi in December 2015 and have tolerated it well. His PSA have dropped dramatically from 31.56 down to 3.03. He has tolerated this medication well and the plan is to continue with the current dose and schedule.  2. Androgen depravation: Have recommended he continues Lupron which he has been receiving every 6 months. I  will check his testosterone level and restart androgen deprivation if needed to.  3. Bone directed therapy: He will likely need bone directed therapy in the form of Xgeva or Zometa after dental clearance.   4. Pulmonary nodules: This could be suspicious for pulmonary metastasis. I will obtain CT scan for staging purposes before the next visit and a follow-up the lung lesion.  5. Follow-up: Will be in 6 weeks after a CT scan and laboratory testing.    Zola Button, MD 3/9/20163:36 PM

## 2014-09-26 NOTE — Telephone Encounter (Signed)
gv adn pritned appt sched and avs for pt for April gv pt barium

## 2014-09-27 LAB — PSA: PSA: 1.8 ng/mL (ref <=4.00)

## 2014-10-11 DIAGNOSIS — D692 Other nonthrombocytopenic purpura: Secondary | ICD-10-CM | POA: Diagnosis not present

## 2014-10-11 DIAGNOSIS — I872 Venous insufficiency (chronic) (peripheral): Secondary | ICD-10-CM | POA: Diagnosis not present

## 2014-10-11 DIAGNOSIS — C44212 Basal cell carcinoma of skin of right ear and external auricular canal: Secondary | ICD-10-CM | POA: Diagnosis not present

## 2014-10-11 DIAGNOSIS — Z85820 Personal history of malignant melanoma of skin: Secondary | ICD-10-CM | POA: Diagnosis not present

## 2014-10-11 DIAGNOSIS — D225 Melanocytic nevi of trunk: Secondary | ICD-10-CM | POA: Diagnosis not present

## 2014-10-11 DIAGNOSIS — L570 Actinic keratosis: Secondary | ICD-10-CM | POA: Diagnosis not present

## 2014-10-11 DIAGNOSIS — D485 Neoplasm of uncertain behavior of skin: Secondary | ICD-10-CM | POA: Diagnosis not present

## 2014-10-11 DIAGNOSIS — I8312 Varicose veins of left lower extremity with inflammation: Secondary | ICD-10-CM | POA: Diagnosis not present

## 2014-10-11 DIAGNOSIS — I8311 Varicose veins of right lower extremity with inflammation: Secondary | ICD-10-CM | POA: Diagnosis not present

## 2014-10-11 DIAGNOSIS — Z85828 Personal history of other malignant neoplasm of skin: Secondary | ICD-10-CM | POA: Diagnosis not present

## 2014-10-11 DIAGNOSIS — B351 Tinea unguium: Secondary | ICD-10-CM | POA: Diagnosis not present

## 2014-10-11 DIAGNOSIS — D1801 Hemangioma of skin and subcutaneous tissue: Secondary | ICD-10-CM | POA: Diagnosis not present

## 2014-10-15 DIAGNOSIS — H903 Sensorineural hearing loss, bilateral: Secondary | ICD-10-CM | POA: Diagnosis not present

## 2014-11-07 ENCOUNTER — Other Ambulatory Visit (HOSPITAL_BASED_OUTPATIENT_CLINIC_OR_DEPARTMENT_OTHER): Payer: Medicare Other

## 2014-11-07 ENCOUNTER — Encounter (HOSPITAL_COMMUNITY): Payer: Self-pay

## 2014-11-07 ENCOUNTER — Ambulatory Visit (HOSPITAL_COMMUNITY)
Admission: RE | Admit: 2014-11-07 | Discharge: 2014-11-07 | Disposition: A | Discharge status: Home / Self Care | Payer: Medicare Other | Source: Ambulatory Visit | Attending: Oncology | Admitting: Oncology

## 2014-11-07 DIAGNOSIS — Z85110 Personal history of malignant carcinoid tumor of bronchus and lung: Secondary | ICD-10-CM | POA: Diagnosis not present

## 2014-11-07 DIAGNOSIS — Z8546 Personal history of malignant neoplasm of prostate: Secondary | ICD-10-CM | POA: Diagnosis not present

## 2014-11-07 DIAGNOSIS — Z902 Acquired absence of lung [part of]: Secondary | ICD-10-CM | POA: Diagnosis not present

## 2014-11-07 DIAGNOSIS — C7951 Secondary malignant neoplasm of bone: Secondary | ICD-10-CM | POA: Insufficient documentation

## 2014-11-07 DIAGNOSIS — C61 Malignant neoplasm of prostate: Secondary | ICD-10-CM | POA: Diagnosis not present

## 2014-11-07 DIAGNOSIS — C7801 Secondary malignant neoplasm of right lung: Secondary | ICD-10-CM | POA: Diagnosis not present

## 2014-11-07 DIAGNOSIS — C3492 Malignant neoplasm of unspecified part of left bronchus or lung: Secondary | ICD-10-CM | POA: Diagnosis not present

## 2014-11-07 LAB — CBC WITH DIFFERENTIAL/PLATELET
BASO%: 0.6 % (ref 0.0–2.0)
Basophils Absolute: 0 10*3/uL (ref 0.0–0.1)
EOS%: 2 % (ref 0.0–7.0)
Eosinophils Absolute: 0.1 10*3/uL (ref 0.0–0.5)
HCT: 38.9 % (ref 38.4–49.9)
HGB: 12.6 g/dL — ABNORMAL LOW (ref 13.0–17.1)
LYMPH%: 24.2 % (ref 14.0–49.0)
MCH: 29.4 pg (ref 27.2–33.4)
MCHC: 32.4 g/dL (ref 32.0–36.0)
MCV: 90.9 fL (ref 79.3–98.0)
MONO#: 0.6 10*3/uL (ref 0.1–0.9)
MONO%: 10.3 % (ref 0.0–14.0)
NEUT%: 62.9 % (ref 39.0–75.0)
NEUTROS ABS: 3.4 10*3/uL (ref 1.5–6.5)
Platelets: 267 10*3/uL (ref 140–400)
RBC: 4.28 10*6/uL (ref 4.20–5.82)
RDW: 16.2 % — ABNORMAL HIGH (ref 11.0–14.6)
WBC: 5.4 10*3/uL (ref 4.0–10.3)
lymph#: 1.3 10*3/uL (ref 0.9–3.3)

## 2014-11-07 LAB — COMPREHENSIVE METABOLIC PANEL (CC13)
ALK PHOS: 113 U/L (ref 40–150)
ALT: 10 U/L (ref 0–55)
ANION GAP: 13 meq/L — AB (ref 3–11)
AST: 20 U/L (ref 5–34)
Albumin: 3.7 g/dL (ref 3.5–5.0)
BILIRUBIN TOTAL: 0.28 mg/dL (ref 0.20–1.20)
BUN: 11.4 mg/dL (ref 7.0–26.0)
CHLORIDE: 99 meq/L (ref 98–109)
CO2: 27 meq/L (ref 22–29)
CREATININE: 0.8 mg/dL (ref 0.7–1.3)
Calcium: 9.1 mg/dL (ref 8.4–10.4)
EGFR: 82 mL/min/{1.73_m2} — AB (ref >90)
Glucose: 96 mg/dl (ref 70–140)
Potassium: 4.4 mEq/L (ref 3.5–5.1)
Sodium: 138 mEq/L (ref 136–145)
Total Protein: 7.1 g/dL (ref 6.4–8.3)

## 2014-11-07 MED ORDER — IOHEXOL 300 MG/ML  SOLN
50.0000 mL | Freq: Once | INTRAMUSCULAR | Status: AC | PRN
  Administered 2014-11-07: 50 mL via ORAL

## 2014-11-07 MED ORDER — IOHEXOL 300 MG/ML  SOLN
100.0000 mL | Freq: Once | INTRAMUSCULAR | Status: AC | PRN
  Administered 2014-11-07: 100 mL via INTRAVENOUS

## 2014-11-08 ENCOUNTER — Other Ambulatory Visit: Payer: Self-pay | Admitting: *Deleted

## 2014-11-08 ENCOUNTER — Other Ambulatory Visit: Payer: Self-pay | Admitting: Oncology

## 2014-11-08 ENCOUNTER — Telehealth: Payer: Self-pay | Admitting: *Deleted

## 2014-11-08 ENCOUNTER — Telehealth: Payer: Self-pay | Admitting: Oncology

## 2014-11-08 LAB — PSA: PSA: 1.34 ng/mL (ref <=4.00)

## 2014-11-08 NOTE — Telephone Encounter (Signed)
Spoke with patients wife and the the patient has gone out of town to play golf and will keep 4/22

## 2014-11-08 NOTE — Telephone Encounter (Signed)
Received call from wife Webb Silversmith.  Instructed Webb Silversmith that pt needs to keep appt with Dr. Alen Blew on 11/09/14 at 130 pm.  Webb Silversmith stated they would be coming in for appt. [facility]  Phone    [phone].

## 2014-11-09 ENCOUNTER — Ambulatory Visit (HOSPITAL_BASED_OUTPATIENT_CLINIC_OR_DEPARTMENT_OTHER): Payer: Medicare Other | Admitting: Oncology

## 2014-11-09 ENCOUNTER — Telehealth: Payer: Self-pay | Admitting: Oncology

## 2014-11-09 VITALS — BP 160/67 | HR 88 | Temp 97.9°F | Resp 18 | Ht 72.0 in | Wt 182.6 lb

## 2014-11-09 DIAGNOSIS — C61 Malignant neoplasm of prostate: Secondary | ICD-10-CM

## 2014-11-09 DIAGNOSIS — C7951 Secondary malignant neoplasm of bone: Secondary | ICD-10-CM | POA: Diagnosis not present

## 2014-11-09 DIAGNOSIS — R918 Other nonspecific abnormal finding of lung field: Secondary | ICD-10-CM | POA: Diagnosis not present

## 2014-11-09 NOTE — Telephone Encounter (Signed)
gave and printed appt sched and avs for pt for [facility]

## 2014-11-09 NOTE — Progress Notes (Signed)
Hematology and Oncology Follow Up Visit  Justin Porter [id number] 17-Apr-1929 81 y.o. 11/09/2014 1:44 PM [name], MDHusain, [name], MD   Principle Diagnosis: 81 year old gentleman with Castration resistant metastatic prostate cancer with disease to the bone. His initial diagnosis was in 1995.  Prior Therapy:  He is status post prostatectomy and subsequently developed biochemical relapse treated with salvage radiation therapy. He did develop presumed pulmonary metastasis status post surgical resection in 2003. The pathology confirmed the presence of adenocarcinoma from a prostate etiology. He has been on androgen depravation initially intermittently and subsequently continuously since 2013. Most recently he developed a rise in his PSA with a doubling time less than 6 months. He also has bony metastasis.    Current therapy:  Androgen deprivation.  Xtandi started and December 2015.   Interim History:  Mr. Christen presents today for a followup visit with his wife. Since the last visit, he reports no new complaints. He continues to be on Xtandi without any major complications. He does not report any excessive fatigue. He is able to travel to the beach and play golf the first time in a while. He did have a melanoma removed from the inside of his left leg. He does not report any GI toxicities. He does not report any problems with mobility or bone pain. He continues to ambulate without any major difficulty. He does not report any hemoptysis or breathing complaints. He does not report any constitutional symptoms of fevers or chills or weight loss. He does not report any neurological symptoms of headaches or blurry vision or syncope. Is not reporting any seizures or dementia. He has not reported any chest pain or palpitation. He has not reported any cough or hemoptysis. He has not reported any nausea or vomiting or abdominal pain. He does not report any frequency urgency or hesitancy or hematuria. He  does not report any lymphadenopathy or petechiae. He does not report any skin rashes or lesions. He does not report any depression or side he does report hearing loss. Remainder of his review of systems unremarkable.      Medications: I have reviewed the patient's current medications.  Current Outpatient Prescriptions  Medication Sig Dispense Refill  . calcium citrate-vitamin D (CITRACAL+D) 315-200 MG-UNIT per tablet Take 1 tablet by mouth every morning.     . docusate sodium 100 MG CAPS Take 100 mg by mouth 2 (two) times daily. 10 capsule 0  . enzalutamide (XTANDI) 40 MG capsule Take 4 capsules (160 mg total) by mouth daily. 120 capsule 0  . ferrous sulfate 325 (65 FE) MG tablet Take 1 tablet (325 mg total) by mouth 3 (three) times daily after meals.  3  . furosemide (LASIX) 20 MG tablet Take 20 mg by mouth 2 (two) times daily.    Marland Kitchen HYDROcodone-acetaminophen (NORCO) 7.5-325 MG per tablet Take 1-2 tablets by mouth every 4 (four) hours as needed for moderate pain. 100 tablet 0  . Multiple Vitamin (MULITIVITAMIN WITH MINERALS) TABS Take 1 tablet by mouth every morning.    . Omega-3 Fatty Acids (FISH OIL) 1200 MG CAPS Take 1 capsule by mouth every morning.     Marland Kitchen OVER THE COUNTER MEDICATION Take 1 tablet by mouth 2 (two) times daily. Energy Plus    . polyethylene glycol (MIRALAX / GLYCOLAX) packet Take 17 g by mouth 2 (two) times daily. 14 each 0  . Potassium 99 MG TABS Take 1 tablet by mouth every morning.    . simvastatin (ZOCOR) 20 MG tablet  Take 20 mg by mouth every evening.     Marland Kitchen tiZANidine (ZANAFLEX) 4 MG tablet Take 1 tablet (4 mg total) by mouth every 6 (six) hours as needed for muscle spasms. 30 tablet 0  . vitamin E 1000 UNIT capsule Take 1,000 Units by mouth every morning.     No current facility-administered medications for this visit.     Allergies:  Allergies  Allergen Reactions  . Codeine Nausea And Vomiting  . Shrimp [Shellfish Allergy] Nausea And Vomiting  . Betadine  [Povidone Iodine] Rash    Past Medical History, Surgical history, Social history, and Family History were reviewed and updated.   Physical Exam: Blood pressure 160/67, pulse 88, temperature 97.9 F (36.6 C), temperature source Oral, resp. rate 18, height 6' (1.829 m), weight 182 lb 9.6 oz (82.827 kg), SpO2 99 %. ECOG: 1 General appearance: alert and cooperative Head: Normocephalic, without obvious abnormality Neck: no adenopathy Lymph nodes: Cervical, supraclavicular, and axillary nodes normal. Heart:regular rate and rhythm, S1, S2 normal, no murmur, click, rub or gallop Lung:chest clear, no wheezing, rales, normal symmetric air entry Abdomin: soft, non-tender, without masses or organomegaly EXT: 1+ edema bilateral lower extremities.   Lab Results: Lab Results  Component Value Date   WBC 5.4 11/07/2014   HGB 12.6* 11/07/2014   HCT 38.9 11/07/2014   MCV 90.9 11/07/2014   PLT 267 11/07/2014     Chemistry      Component Value Date/Time   NA 138 11/07/2014 1417   NA 142 05/23/2014 0423   K 4.4 11/07/2014 1417   K 4.2 05/23/2014 0423   CL 102 05/23/2014 0423   CO2 27 11/07/2014 1417   CO2 26 05/23/2014 0423   BUN 11.4 11/07/2014 1417   BUN 19 05/23/2014 0423   CREATININE 0.8 11/07/2014 1417   CREATININE 0.82 05/23/2014 0423   CREATININE 0.75 10/29/2011 1545      Component Value Date/Time   CALCIUM 9.1 11/07/2014 1417   CALCIUM 8.7 05/23/2014 0423   ALKPHOS 113 11/07/2014 1417   ALKPHOS 83 12/14/2011 1044   AST 20 11/07/2014 1417   AST 21 12/14/2011 1044   ALT 10 11/07/2014 1417   ALT 25 12/14/2011 1044   BILITOT 0.28 11/07/2014 1417   BILITOT 0.4 12/14/2011 1044       Results for KHAMAURI, BAUERNFEIND (MRN [id number]) as of 11/09/2014 13:45  Ref. Range 07/25/2014 12:26 08/22/2014 13:20 09/26/2014 15:08 11/07/2014 14:17  PSA Latest Ref Range: <=4.00 ng/mL 6.96 (H) 3.03 1.80 1.34     EXAM: CT CHEST, ABDOMEN, AND PELVIS WITH CONTRAST  TECHNIQUE: Multidetector CT  imaging of the chest, abdomen and pelvis was performed following the standard protocol during bolus administration of intravenous contrast.  CONTRAST: 147m OMNIPAQUE IOHEXOL 300 MG/ML SOLN  COMPARISON: CT chest dated 06/11/2014. CT abdomen pelvis dated 09/29/2013. Whole-body bone scan dated 04/02/2014.  FINDINGS: CT CHEST FINDINGS  Mediastinum/Nodes: Heart is normal in size. No pericardial effusion.  Coronary atherosclerosis.  Atherosclerotic calcifications of the aortic arch.  Small mediastinal lymph nodes, including a 7 mm short axis subcarinal node (series 2/ image 26), within normal limits and unchanged. No suspicious hilar or axillary lymphadenopathy.  Visualized thyroid is unremarkable.  Lungs/Pleura: Status post left upper lobectomy.  Numerous bilateral pulmonary nodules/metastasis, approximately 30 in number, improved. Representative/index metastases include:  --10 mm nodule in the right middle lobe (series 4/image 30), previously 16 mm  --8 mm nodule in the left lower lobe (series 4/image 38), previously 9 mm  --7  mm nodule in the superior right lower lobe (series 4/image 21), previously 8 mm  --8 mm nodule in the central right lower lobe (series 4/ image 45), previously 13 mm  Calcified pleural parenchymal scarring in the right lung apex.  Mild centrilobular and paraseptal emphysematous changes.  Scarring in the medial left lung.  No pleural effusion or pneumothorax.  Musculoskeletal: Degenerative changes of the thoracic spine.  CT ABDOMEN PELVIS FINDINGS  Hepatobiliary: Liver is within normal limits. No suspicious/enhancing hepatic lesions.  Gallbladder is unremarkable. No intrahepatic or extrahepatic ductal dilatation.  Pancreas: Within normal limits.  Spleen: Within normal limits.  Adrenals/Urinary Tract: Adrenal glands are within normal limits.  Kidneys are within normal limits. No  hydronephrosis.  Bladder is within normal limits.  Stomach/Bowel: Stomach is notable for a small hiatal hernia.  No evidence of bowel obstruction.  Vascular/Lymphatic: Atherosclerotic calcifications of the abdominal aorta and branch vessels.  No suspicious abdominopelvic lymphadenopathy.  Status post pelvic lymphadenectomy.  Reproductive: Status post prostatectomy.  Other: No abdominopelvic ascites.  Musculoskeletal: Chronic sclerosis involving the anterior pelvis/parasymphyseal region/superior pubic rami (series 2/ image 110), left greater than right, with associated pathologic fracture on the left. This has progressed from 2015. Associated abnormal soft tissue anteriorly on the left (series 2/ image 108).  Given the clinical history of radiation, most of these findings could be explained by radiation changes with pathologic fracture and possible superimposed chronic osteomyelitis. However, tumor is difficult to exclude.  Interval left hip arthroplasty.  Status post PLIF at L3-4.  Degenerative changes of the lumbar spine.  IMPRESSION: Status post prostatectomy with pelvic lymph node dissection.  Widespread pulmonary metastases, measuring up to 10 mm in the right middle lobe, improved.  Chronic changes involving the anterior pelvis, left greater than right, as described above. This appearance is favored to reflect radiation changes with pathologic fracture and possible superimposed chronic osteomyelitis. However, tumor is difficult to exclude.  Status post left upper lobectomy.   81 year old gentleman with the following issues:   1. Castration resistant metastatic prostate cancer with disease to the bone. His initial diagnosis was in 1995 status post prostatectomy and subsequently developed biochemical relapse treated with salvage radiation therapy.  He developed lung recurrence in 2003 and underwent surgical resection and the pathology reviewed  above. Most recently his PSA was up to 31.5 with a doubling time of less then 6 months. CT scan of the chest on 06/11/2014 showed disease relapse with a PSA up to 31. PSA from 07/25/2014 was 6.96 which represents a good response and nice reduction in the PSA level. PSA from today's visit is pending.  He was started on Xtandi in December 2015 and have tolerated it well. His PSA have dropped dramatically from 31.56 down to 1.34. He has tolerated this medication well and the plan is to continue with the current dose and schedule.  2. Androgen depravation: Have recommended he continues Lupron which he has been receiving every 6 months. I will check his testosterone level and restart androgen deprivation if needed to.  3. Bone directed therapy: He will likely need bone directed therapy in the form of Xgeva or Zometa after dental clearance.   4. Pulmonary nodules: This could be suspicious for pulmonary metastasis. CT scan on 11/07/2014 were reviewed and showed his pulmonary lesions are actually improving which might suggest pulmonary metastasis that are responding to therapy.  5. Follow-up: Will be in 6 weeks to check for any new complications related to this medication.    Dyllan Kats,  MD 4/22/20161:44 PM

## 2014-11-21 DIAGNOSIS — L570 Actinic keratosis: Secondary | ICD-10-CM | POA: Diagnosis not present

## 2014-11-21 DIAGNOSIS — Z85828 Personal history of other malignant neoplasm of skin: Secondary | ICD-10-CM | POA: Diagnosis not present

## 2014-11-21 DIAGNOSIS — D692 Other nonthrombocytopenic purpura: Secondary | ICD-10-CM | POA: Diagnosis not present

## 2014-11-29 DIAGNOSIS — M47816 Spondylosis without myelopathy or radiculopathy, lumbar region: Secondary | ICD-10-CM | POA: Diagnosis not present

## 2014-11-29 DIAGNOSIS — S335XXA Sprain of ligaments of lumbar spine, initial encounter: Secondary | ICD-10-CM | POA: Diagnosis not present

## 2014-11-29 DIAGNOSIS — M419 Scoliosis, unspecified: Secondary | ICD-10-CM | POA: Diagnosis not present

## 2014-11-29 DIAGNOSIS — M549 Dorsalgia, unspecified: Secondary | ICD-10-CM | POA: Diagnosis not present

## 2014-12-03 DIAGNOSIS — S335XXA Sprain of ligaments of lumbar spine, initial encounter: Secondary | ICD-10-CM | POA: Diagnosis not present

## 2014-12-03 DIAGNOSIS — M545 Low back pain: Secondary | ICD-10-CM | POA: Diagnosis not present

## 2014-12-05 DIAGNOSIS — M545 Low back pain: Secondary | ICD-10-CM | POA: Diagnosis not present

## 2014-12-05 DIAGNOSIS — S335XXA Sprain of ligaments of lumbar spine, initial encounter: Secondary | ICD-10-CM | POA: Diagnosis not present

## 2014-12-10 DIAGNOSIS — S335XXA Sprain of ligaments of lumbar spine, initial encounter: Secondary | ICD-10-CM | POA: Diagnosis not present

## 2014-12-10 DIAGNOSIS — M545 Low back pain: Secondary | ICD-10-CM | POA: Diagnosis not present

## 2014-12-12 DIAGNOSIS — M545 Low back pain: Secondary | ICD-10-CM | POA: Diagnosis not present

## 2014-12-12 DIAGNOSIS — S335XXA Sprain of ligaments of lumbar spine, initial encounter: Secondary | ICD-10-CM | POA: Diagnosis not present

## 2014-12-18 DIAGNOSIS — S335XXA Sprain of ligaments of lumbar spine, initial encounter: Secondary | ICD-10-CM | POA: Diagnosis not present

## 2014-12-18 DIAGNOSIS — M545 Low back pain: Secondary | ICD-10-CM | POA: Diagnosis not present

## 2014-12-20 DIAGNOSIS — S335XXA Sprain of ligaments of lumbar spine, initial encounter: Secondary | ICD-10-CM | POA: Diagnosis not present

## 2014-12-20 DIAGNOSIS — M545 Low back pain: Secondary | ICD-10-CM | POA: Diagnosis not present

## 2014-12-24 DIAGNOSIS — M545 Low back pain: Secondary | ICD-10-CM | POA: Diagnosis not present

## 2014-12-24 DIAGNOSIS — S335XXA Sprain of ligaments of lumbar spine, initial encounter: Secondary | ICD-10-CM | POA: Diagnosis not present

## 2014-12-26 DIAGNOSIS — S335XXA Sprain of ligaments of lumbar spine, initial encounter: Secondary | ICD-10-CM | POA: Diagnosis not present

## 2014-12-26 DIAGNOSIS — M545 Low back pain: Secondary | ICD-10-CM | POA: Diagnosis not present

## 2014-12-27 DIAGNOSIS — H6123 Impacted cerumen, bilateral: Secondary | ICD-10-CM | POA: Diagnosis not present

## 2014-12-31 DIAGNOSIS — S335XXA Sprain of ligaments of lumbar spine, initial encounter: Secondary | ICD-10-CM | POA: Diagnosis not present

## 2014-12-31 DIAGNOSIS — M545 Low back pain: Secondary | ICD-10-CM | POA: Diagnosis not present

## 2015-01-02 DIAGNOSIS — S335XXA Sprain of ligaments of lumbar spine, initial encounter: Secondary | ICD-10-CM | POA: Diagnosis not present

## 2015-01-02 DIAGNOSIS — M545 Low back pain: Secondary | ICD-10-CM | POA: Diagnosis not present

## 2015-01-04 ENCOUNTER — Ambulatory Visit (HOSPITAL_BASED_OUTPATIENT_CLINIC_OR_DEPARTMENT_OTHER): Payer: Medicare Other | Admitting: Physician Assistant

## 2015-01-04 ENCOUNTER — Other Ambulatory Visit (HOSPITAL_BASED_OUTPATIENT_CLINIC_OR_DEPARTMENT_OTHER): Payer: Medicare Other

## 2015-01-04 ENCOUNTER — Telehealth: Payer: Self-pay | Admitting: Oncology

## 2015-01-04 VITALS — BP 146/60 | HR 76 | Temp 98.6°F | Resp 18 | Ht 72.0 in | Wt 181.9 lb

## 2015-01-04 DIAGNOSIS — C61 Malignant neoplasm of prostate: Secondary | ICD-10-CM

## 2015-01-04 DIAGNOSIS — R918 Other nonspecific abnormal finding of lung field: Secondary | ICD-10-CM

## 2015-01-04 DIAGNOSIS — C7951 Secondary malignant neoplasm of bone: Secondary | ICD-10-CM | POA: Diagnosis not present

## 2015-01-04 LAB — CBC WITH DIFFERENTIAL/PLATELET
BASO%: 1.1 % (ref 0.0–2.0)
Basophils Absolute: 0.1 10*3/uL (ref 0.0–0.1)
EOS%: 2.5 % (ref 0.0–7.0)
Eosinophils Absolute: 0.1 10*3/uL (ref 0.0–0.5)
HCT: 40 % (ref 38.4–49.9)
HEMOGLOBIN: 13.4 g/dL (ref 13.0–17.1)
LYMPH%: 22.3 % (ref 14.0–49.0)
MCH: 31.4 pg (ref 27.2–33.4)
MCHC: 33.6 g/dL (ref 32.0–36.0)
MCV: 93.6 fL (ref 79.3–98.0)
MONO#: 0.4 10*3/uL (ref 0.1–0.9)
MONO%: 7.9 % (ref 0.0–14.0)
NEUT%: 66.2 % (ref 39.0–75.0)
NEUTROS ABS: 3.4 10*3/uL (ref 1.5–6.5)
Platelets: 270 10*3/uL (ref 140–400)
RBC: 4.28 10*6/uL (ref 4.20–5.82)
RDW: 13.7 % (ref 11.0–14.6)
WBC: 5.1 10*3/uL (ref 4.0–10.3)
lymph#: 1.1 10*3/uL (ref 0.9–3.3)

## 2015-01-04 LAB — COMPREHENSIVE METABOLIC PANEL (CC13)
ALBUMIN: 3.7 g/dL (ref 3.5–5.0)
ALT: 10 U/L (ref 0–55)
AST: 17 U/L (ref 5–34)
Alkaline Phosphatase: 123 U/L (ref 40–150)
Anion Gap: 8 mEq/L (ref 3–11)
BUN: 15.1 mg/dL (ref 7.0–26.0)
CALCIUM: 9.5 mg/dL (ref 8.4–10.4)
CO2: 33 mEq/L — ABNORMAL HIGH (ref 22–29)
Chloride: 99 mEq/L (ref 98–109)
Creatinine: 1 mg/dL (ref 0.7–1.3)
EGFR: 72 mL/min/{1.73_m2} — ABNORMAL LOW (ref >90)
Glucose: 114 mg/dl (ref 70–140)
POTASSIUM: 4.2 meq/L (ref 3.5–5.1)
Sodium: 139 mEq/L (ref 136–145)
Total Bilirubin: 0.47 mg/dL (ref 0.20–1.20)
Total Protein: 7 g/dL (ref 6.4–8.3)

## 2015-01-04 NOTE — Telephone Encounter (Signed)
Pt confirmed MD visit per 06/17 POF, gave pt AVS and Calendar... KJ

## 2015-01-05 LAB — PSA: PSA: 1.44 ng/mL (ref <=4.00)

## 2015-01-07 DIAGNOSIS — S335XXA Sprain of ligaments of lumbar spine, initial encounter: Secondary | ICD-10-CM | POA: Diagnosis not present

## 2015-01-07 DIAGNOSIS — M545 Low back pain: Secondary | ICD-10-CM | POA: Diagnosis not present

## 2015-01-09 DIAGNOSIS — M545 Low back pain: Secondary | ICD-10-CM | POA: Diagnosis not present

## 2015-01-09 DIAGNOSIS — S335XXA Sprain of ligaments of lumbar spine, initial encounter: Secondary | ICD-10-CM | POA: Diagnosis not present

## 2015-01-09 NOTE — Progress Notes (Signed)
Hematology and Oncology Follow Up Visit  Justin Porter [id number] 02/17/1929 81 y.o. 01/09/2015 1:48 PM Justin Porter, MDHusain, [name], MD   Principle Diagnosis: 81 year old gentleman with Castration resistant metastatic prostate cancer with disease to the bone. His initial diagnosis was in 1995.  Prior Therapy:  He is status post prostatectomy and subsequently developed biochemical relapse treated with salvage radiation therapy. He did develop presumed pulmonary metastasis status post surgical resection in 2003. The pathology confirmed the presence of adenocarcinoma from a prostate etiology. He has been on androgen depravation initially intermittently and subsequently continuously since 2013. Most recently he developed a rise in his PSA with a doubling time less than 6 months. He also has bony metastasis.    Current therapy:  Androgen deprivation.  Xtandi started and December 2015.   Interim History:  Justin Porter presents today for a followup visit with his wife. Since the last visit, he reports no new complaints. He continues to be on Xtandi without any major complications. He does not report any excessive fatigue. He does not report any GI toxicities. He does not report any problems with mobility or bone pain. He continues to ambulate without any major difficulty. He does not report any hemoptysis or breathing complaints. He does not report any constitutional symptoms of fevers or chills or weight loss. He does not report any neurological symptoms of headaches or blurry vision or syncope. Is not reporting any seizures or dementia. He has not reported any chest pain or palpitation. He has not reported any cough or hemoptysis. He has not reported any nausea or vomiting or abdominal pain. He does not report any frequency urgency or hesitancy or hematuria. He does not report any lymphadenopathy or petechiae. He does not report any skin rashes or lesions. He does not report any depression or side he  does report hearing loss. Remainder of his review of systems unremarkable.      Medications: I have reviewed the patient's current medications.  Current Outpatient Prescriptions  Medication Sig Dispense Refill  . calcium citrate-vitamin D (CITRACAL+D) 315-200 MG-UNIT per tablet Take 1 tablet by mouth every morning.     . furosemide (LASIX) 20 MG tablet Take 20 mg by mouth 2 (two) times daily.    . Multiple Vitamin (MULITIVITAMIN WITH MINERALS) TABS Take 1 tablet by mouth every morning.    . Potassium 99 MG TABS Take 1 tablet by mouth every morning.    . simvastatin (ZOCOR) 20 MG tablet Take 20 mg by mouth every evening.     . docusate sodium 100 MG CAPS Take 100 mg by mouth 2 (two) times daily. (Patient not taking: Reported on 01/04/2015) 10 capsule 0  . enzalutamide (XTANDI) 40 MG capsule Take 4 capsules (160 mg total) by mouth daily. (Patient not taking: Reported on 01/04/2015) 120 capsule 0  . ferrous sulfate 325 (65 FE) MG tablet Take 1 tablet (325 mg total) by mouth 3 (three) times daily after meals. (Patient not taking: Reported on 01/04/2015)  3  . HYDROcodone-acetaminophen (NORCO) 7.5-325 MG per tablet Take 1-2 tablets by mouth every 4 (four) hours as needed for moderate pain. (Patient not taking: Reported on 01/04/2015) 100 tablet 0  . Omega-3 Fatty Acids (FISH OIL) 1200 MG CAPS Take 1 capsule by mouth every morning.     Marland Kitchen OVER THE COUNTER MEDICATION Take 1 tablet by mouth 2 (two) times daily. Energy Plus    . polyethylene glycol (MIRALAX / GLYCOLAX) packet Take 17 g by mouth 2 (two) times  daily. (Patient not taking: Reported on 01/04/2015) 14 each 0  . tiZANidine (ZANAFLEX) 4 MG tablet Take 1 tablet (4 mg total) by mouth every 6 (six) hours as needed for muscle spasms. (Patient not taking: Reported on 01/04/2015) 30 tablet 0  . vitamin E 1000 UNIT capsule Take 1,000 Units by mouth every morning.     No current facility-administered medications for this visit.     Allergies:  Allergies   Allergen Reactions  . Codeine Nausea And Vomiting  . Shrimp [Shellfish Allergy] Nausea And Vomiting  . Betadine [Povidone Iodine] Rash    Past Medical History, Surgical history, Social history, and Family History were reviewed and updated.   Physical Exam: Blood pressure 146/60, pulse 76, temperature 98.6 F (37 C), temperature source Oral, resp. rate 18, height 6' (1.829 m), weight 181 lb 14.4 oz (82.509 kg), SpO2 100 %. ECOG: 1 General appearance: alert and cooperative Head: Normocephalic, without obvious abnormality Neck: no adenopathy Lymph nodes: Cervical, supraclavicular, and axillary nodes normal. Heart:regular rate and rhythm, S1, S2 normal, no murmur, click, rub or gallop Lung:chest clear, no wheezing, rales, normal symmetric air entry Abdomin: soft, non-tender, without masses or organomegaly EXT: trace -1+ edema bilateral lower extremities.   Lab Results: Lab Results  Component Value Date   WBC 5.1 01/04/2015   HGB 13.4 01/04/2015   HCT 40.0 01/04/2015   MCV 93.6 01/04/2015   PLT 270 01/04/2015     Chemistry      Component Value Date/Time   NA 139 01/04/2015 1315   NA 142 05/23/2014 0423   K 4.2 01/04/2015 1315   K 4.2 05/23/2014 0423   CL 102 05/23/2014 0423   CO2 33* 01/04/2015 1315   CO2 26 05/23/2014 0423   BUN 15.1 01/04/2015 1315   BUN 19 05/23/2014 0423   CREATININE 1.0 01/04/2015 1315   CREATININE 0.82 05/23/2014 0423   CREATININE 0.75 10/29/2011 1545      Component Value Date/Time   CALCIUM 9.5 01/04/2015 1315   CALCIUM 8.7 05/23/2014 0423   ALKPHOS 123 01/04/2015 1315   ALKPHOS 83 12/14/2011 1044   AST 17 01/04/2015 1315   AST 21 12/14/2011 1044   ALT 10 01/04/2015 1315   ALT 25 12/14/2011 1044   BILITOT 0.47 01/04/2015 1315   BILITOT 0.4 12/14/2011 1044       Results for Justin Porter (MRN [id number]) as of 11/09/2014 13:45  Ref. Range 07/25/2014 12:26 08/22/2014 13:20 09/26/2014 15:08 11/07/2014 14:17  PSA Latest Ref Range: <=4.00  ng/mL 6.96 (H) 3.03 1.80 1.34     EXAM: CT CHEST, ABDOMEN, AND PELVIS WITH CONTRAST  TECHNIQUE: Multidetector CT imaging of the chest, abdomen and pelvis was performed following the standard protocol during bolus administration of intravenous contrast.  CONTRAST: 133m OMNIPAQUE IOHEXOL 300 MG/ML SOLN  COMPARISON: CT chest dated 06/11/2014. CT abdomen pelvis dated 09/29/2013. Whole-body bone scan dated 04/02/2014.  FINDINGS: CT CHEST FINDINGS  Mediastinum/Nodes: Heart is normal in size. No pericardial effusion.  Coronary atherosclerosis.  Atherosclerotic calcifications of the aortic arch.  Small mediastinal lymph nodes, including a 7 mm short axis subcarinal node (series 2/ image 26), within normal limits and unchanged. No suspicious hilar or axillary lymphadenopathy.  Visualized thyroid is unremarkable.  Lungs/Pleura: Status post left upper lobectomy.  Numerous bilateral pulmonary nodules/metastasis, approximately 30 in number, improved. Representative/index metastases include:  --10 mm nodule in the right middle lobe (series 4/image 30), previously 16 mm  --8 mm nodule in the left lower lobe (series  4/image 38), previously 9 mm  --7 mm nodule in the superior right lower lobe (series 4/image 21), previously 8 mm  --8 mm nodule in the central right lower lobe (series 4/ image 45), previously 13 mm  Calcified pleural parenchymal scarring in the right lung apex.  Mild centrilobular and paraseptal emphysematous changes.  Scarring in the medial left lung.  No pleural effusion or pneumothorax.  Musculoskeletal: Degenerative changes of the thoracic spine.  CT ABDOMEN PELVIS FINDINGS  Hepatobiliary: Liver is within normal limits. No suspicious/enhancing hepatic lesions.  Gallbladder is unremarkable. No intrahepatic or extrahepatic ductal dilatation.  Pancreas: Within normal limits.  Spleen: Within normal  limits.  Adrenals/Urinary Tract: Adrenal glands are within normal limits.  Kidneys are within normal limits. No hydronephrosis.  Bladder is within normal limits.  Stomach/Bowel: Stomach is notable for a small hiatal hernia.  No evidence of bowel obstruction.  Vascular/Lymphatic: Atherosclerotic calcifications of the abdominal aorta and branch vessels.  No suspicious abdominopelvic lymphadenopathy.  Status post pelvic lymphadenectomy.  Reproductive: Status post prostatectomy.  Other: No abdominopelvic ascites.  Musculoskeletal: Chronic sclerosis involving the anterior pelvis/parasymphyseal region/superior pubic rami (series 2/ image 110), left greater than right, with associated pathologic fracture on the left. This has progressed from 2015. Associated abnormal soft tissue anteriorly on the left (series 2/ image 108).  Given the clinical history of radiation, most of these findings could be explained by radiation changes with pathologic fracture and possible superimposed chronic osteomyelitis. However, tumor is difficult to exclude.  Interval left hip arthroplasty.  Status post PLIF at L3-4.  Degenerative changes of the lumbar spine.  IMPRESSION: Status post prostatectomy with pelvic lymph node dissection.  Widespread pulmonary metastases, measuring up to 10 mm in the right middle lobe, improved.  Chronic changes involving the anterior pelvis, left greater than right, as described above. This appearance is favored to reflect radiation changes with pathologic fracture and possible superimposed chronic osteomyelitis. However, tumor is difficult to exclude.  Status post left upper lobectomy.   81 year old gentleman with the following issues:   1. Castration resistant metastatic prostate cancer with disease to the bone. His initial diagnosis was in 1995 status post prostatectomy and subsequently developed biochemical relapse treated with salvage  radiation therapy.  He developed lung recurrence in 2003 and underwent surgical resection and the pathology reviewed above. Most recently his PSA was up to 31.5 with a doubling time of less then 6 months. CT scan of the chest on 06/11/2014 showed disease relapse with a PSA up to 31. PSA from 07/25/2014 was 6.96 which represents a good response and nice reduction in the PSA level. PSA from today's visit is pending.  He was started on Xtandi in December 2015 and have tolerated it well. His PSA have dropped dramatically from 31.56 down to 1.34. He has tolerated this medication well and the plan is to continue with the current dose and schedule.  2. Androgen depravation: Have recommended he continues Lupron which he has been receiving every 6 months. We will check his testosterone level and restart androgen deprivation if needed to.  3. Bone directed therapy: He will likely need bone directed therapy in the form of Xgeva or Zometa after dental clearance.   4. Pulmonary nodules: This could be suspicious for pulmonary metastasis. CT scan on 11/07/2014 were reviewed and showed his pulmonary lesions are actually improving which might suggest pulmonary metastasis that are responding to therapy.  5. Follow-up: Will be in 6 weeks to check for any new complications related  to this medication.    Carlton Adam, PA-C  6/22/20161:48 PM

## 2015-01-09 NOTE — Patient Instructions (Signed)
Continue Xtandi at the current dose Follow up in 6 weeks

## 2015-01-14 DIAGNOSIS — M545 Low back pain: Secondary | ICD-10-CM | POA: Diagnosis not present

## 2015-01-14 DIAGNOSIS — S335XXA Sprain of ligaments of lumbar spine, initial encounter: Secondary | ICD-10-CM | POA: Diagnosis not present

## 2015-01-16 DIAGNOSIS — M545 Low back pain: Secondary | ICD-10-CM | POA: Diagnosis not present

## 2015-01-16 DIAGNOSIS — S335XXA Sprain of ligaments of lumbar spine, initial encounter: Secondary | ICD-10-CM | POA: Diagnosis not present

## 2015-01-23 DIAGNOSIS — M545 Low back pain: Secondary | ICD-10-CM | POA: Diagnosis not present

## 2015-01-23 DIAGNOSIS — S335XXA Sprain of ligaments of lumbar spine, initial encounter: Secondary | ICD-10-CM | POA: Diagnosis not present

## 2015-01-25 DIAGNOSIS — S335XXA Sprain of ligaments of lumbar spine, initial encounter: Secondary | ICD-10-CM | POA: Diagnosis not present

## 2015-01-25 DIAGNOSIS — M545 Low back pain: Secondary | ICD-10-CM | POA: Diagnosis not present

## 2015-02-20 ENCOUNTER — Ambulatory Visit (HOSPITAL_BASED_OUTPATIENT_CLINIC_OR_DEPARTMENT_OTHER): Payer: Medicare Other | Admitting: Oncology

## 2015-02-20 ENCOUNTER — Other Ambulatory Visit (HOSPITAL_BASED_OUTPATIENT_CLINIC_OR_DEPARTMENT_OTHER): Payer: Medicare Other

## 2015-02-20 ENCOUNTER — Telehealth: Payer: Self-pay | Admitting: Oncology

## 2015-02-20 VITALS — BP 149/89 | HR 78 | Temp 97.9°F | Resp 18 | Ht 72.0 in | Wt 184.7 lb

## 2015-02-20 DIAGNOSIS — C7951 Secondary malignant neoplasm of bone: Secondary | ICD-10-CM

## 2015-02-20 DIAGNOSIS — R918 Other nonspecific abnormal finding of lung field: Secondary | ICD-10-CM

## 2015-02-20 DIAGNOSIS — C61 Malignant neoplasm of prostate: Secondary | ICD-10-CM | POA: Diagnosis not present

## 2015-02-20 LAB — CBC WITH DIFFERENTIAL/PLATELET
BASO%: 1.1 % (ref 0.0–2.0)
Basophils Absolute: 0.1 10*3/uL (ref 0.0–0.1)
EOS ABS: 0.1 10*3/uL (ref 0.0–0.5)
EOS%: 2.2 % (ref 0.0–7.0)
HCT: 39.7 % (ref 38.4–49.9)
HGB: 13.4 g/dL (ref 13.0–17.1)
LYMPH%: 23.5 % (ref 14.0–49.0)
MCH: 32 pg (ref 27.2–33.4)
MCHC: 33.8 g/dL (ref 32.0–36.0)
MCV: 94.6 fL (ref 79.3–98.0)
MONO#: 0.4 10*3/uL (ref 0.1–0.9)
MONO%: 8.4 % (ref 0.0–14.0)
NEUT#: 3.3 10*3/uL (ref 1.5–6.5)
NEUT%: 64.8 % (ref 39.0–75.0)
PLATELETS: 244 10*3/uL (ref 140–400)
RBC: 4.19 10*6/uL — AB (ref 4.20–5.82)
RDW: 13.3 % (ref 11.0–14.6)
WBC: 5 10*3/uL (ref 4.0–10.3)
lymph#: 1.2 10*3/uL (ref 0.9–3.3)

## 2015-02-20 LAB — COMPREHENSIVE METABOLIC PANEL (CC13)
ALT: 12 U/L (ref 0–55)
ANION GAP: 7 meq/L (ref 3–11)
AST: 16 U/L (ref 5–34)
Albumin: 3.7 g/dL (ref 3.5–5.0)
Alkaline Phosphatase: 107 U/L (ref 40–150)
BUN: 12.1 mg/dL (ref 7.0–26.0)
CALCIUM: 9.1 mg/dL (ref 8.4–10.4)
CO2: 30 mEq/L — ABNORMAL HIGH (ref 22–29)
CREATININE: 0.9 mg/dL (ref 0.7–1.3)
Chloride: 103 mEq/L (ref 98–109)
EGFR: 79 mL/min/{1.73_m2} — ABNORMAL LOW (ref >90)
GLUCOSE: 123 mg/dL (ref 70–140)
Potassium: 4.4 mEq/L (ref 3.5–5.1)
Sodium: 139 mEq/L (ref 136–145)
Total Bilirubin: 0.38 mg/dL (ref 0.20–1.20)
Total Protein: 6.8 g/dL (ref 6.4–8.3)

## 2015-02-20 NOTE — Progress Notes (Signed)
Hematology and Oncology Follow Up Visit  Justin Porter [id number] Jun 09, 1929 81 y.o. 02/20/2015 1:45 PM Justin Porter, MDHusain, [name], MD   Principle Diagnosis: 81 year old gentleman with Castration resistant metastatic prostate cancer with disease to the bone. His initial diagnosis was in 1995.  Prior Therapy:  He is status post prostatectomy and subsequently developed biochemical relapse treated with salvage radiation therapy. He did develop presumed pulmonary metastasis status post surgical resection in 2003. The pathology confirmed the presence of adenocarcinoma from a prostate etiology. He has been on androgen depravation initially intermittently and subsequently continuously since 2013. Most recently he developed a rise in his PSA with a doubling time less than 6 months. He also has bony metastasis.    Current therapy:   Androgen deprivation.  Xtandi started and December 2015.   Interim History:   [name] presents today for a followup visit with his wife. Since the last visit, he continues to do very well. He reports feeling the best he has felt for a year and a half. He have completed rehabilitation after orthopedic surgery without any setbacks. Is able to drive and performs activities of daily living without any decline. He has not been able to go yet.  He continues to be on Xtandi without any major complications. He does not report any excessive fatigue. He does not report any GI toxicities. He does not report any problems with mobility or bone pain. He continues to ambulate without any major difficulty.  He does not report any hemoptysis or breathing complaints.   He does not report any constitutional symptoms of fevers or chills or weight loss. He does not report any neurological symptoms of headaches or blurry vision or syncope. Is not reporting any seizures or dementia. He has not reported any chest pain or palpitation. He has not reported any cough or hemoptysis. He has  not reported any nausea or vomiting or abdominal pain. He does not report any frequency urgency or hesitancy or hematuria. He does not report any lymphadenopathy or petechiae. He does not report any skin rashes or lesions. He does not report any depression or side he does report hearing loss. Remainder of his review of systems unremarkable.      Medications: I have reviewed the patient's current medications.  Current Outpatient Prescriptions  Medication Sig Dispense Refill  . calcium citrate-vitamin D (CITRACAL+D) 315-200 MG-UNIT per tablet Take 1 tablet by mouth every morning.     . enzalutamide (XTANDI) 40 MG capsule Take 4 capsules (160 mg total) by mouth daily. 120 capsule 0  . ferrous sulfate 325 (65 FE) MG tablet Take 1 tablet (325 mg total) by mouth 3 (three) times daily after meals.  3  . furosemide (LASIX) 20 MG tablet Take 20 mg by mouth 2 (two) times daily.    . Multiple Vitamin (MULITIVITAMIN WITH MINERALS) TABS Take 1 tablet by mouth every morning.    Marland Kitchen OVER THE COUNTER MEDICATION Take 1 tablet by mouth 2 (two) times daily. Energy Plus    . polyethylene glycol (MIRALAX / GLYCOLAX) packet Take 17 g by mouth 2 (two) times daily. 14 each 0  . Potassium 99 MG TABS Take 1 tablet by mouth every morning.    . simvastatin (ZOCOR) 20 MG tablet Take 20 mg by mouth every evening.     . vitamin E 1000 UNIT capsule Take 1,000 Units by mouth every morning.     No current facility-administered medications for this visit.     Allergies:  Allergies  Allergen Reactions  . Codeine Nausea And Vomiting  . Shrimp [Shellfish Allergy] Nausea And Vomiting  . Betadine [Povidone Iodine] Rash    Past Medical History, Surgical history, Social history, and Family History were reviewed and updated.   Physical Exam: Blood pressure 149/89, pulse 78, temperature 97.9 F (36.6 C), temperature source Oral, resp. rate 18, height 6' (1.829 m), weight 184 lb 11.2 oz (83.779 kg), SpO2 100 %. ECOG:  1 General appearance: alert and cooperative.  Elderly gentleman not in any distress. Head: Normocephalic, without obvious abnormality Neck: no adenopathy Lymph nodes: Cervical, supraclavicular, and axillary nodes normal. Heart:regular rate and rhythm, S1, S2 normal, no murmur, click, rub or gallop Lung:chest clear, no wheezing, rales, normal symmetric air entry Abdomin: soft, non-tender, without masses or organomegaly EXT: trace -1+ edema bilateral lower extremities. Neurological examination: No deficits noted.   Lab Results: Lab Results  Component Value Date   WBC 5.0 02/20/2015   HGB 13.4 02/20/2015   HCT 39.7 02/20/2015   MCV 94.6 02/20/2015   PLT 244 02/20/2015     Chemistry      Component Value Date/Time   NA 139 01/04/2015 1315   NA 142 05/23/2014 0423   K 4.2 01/04/2015 1315   K 4.2 05/23/2014 0423   CL 102 05/23/2014 0423   CO2 33* 01/04/2015 1315   CO2 26 05/23/2014 0423   BUN 15.1 01/04/2015 1315   BUN 19 05/23/2014 0423   CREATININE 1.0 01/04/2015 1315   CREATININE 0.82 05/23/2014 0423   CREATININE 0.75 10/29/2011 1545      Component Value Date/Time   CALCIUM 9.5 01/04/2015 1315   CALCIUM 8.7 05/23/2014 0423   ALKPHOS 123 01/04/2015 1315   ALKPHOS 83 12/14/2011 1044   AST 17 01/04/2015 1315   AST 21 12/14/2011 1044   ALT 10 01/04/2015 1315   ALT 25 12/14/2011 1044   BILITOT 0.47 01/04/2015 1315   BILITOT 0.4 12/14/2011 1044       Results for [name], [name] (MRN [id number]) as of 02/20/2015 13:35  Ref. Range 08/22/2014 13:20 09/26/2014 15:08 11/07/2014 14:17 01/04/2015 13:15  PSA Latest Ref Range: <=4.00 ng/mL 3.03 1.80 1.34 1.71        81 year old gentleman with the following issues:   1. Castration resistant metastatic prostate cancer with disease to the bone. His initial diagnosis was in 1995 status post prostatectomy and subsequently developed biochemical relapse treated with salvage radiation therapy.  He developed lung recurrence in 2003 and  underwent surgical resection and the pathology reviewed above. Most recently his PSA was up to 31.5 with a doubling time of less then 6 months. CT scan of the chest on 06/11/2014 showed disease relapse with a PSA up to 31.   He was started on Xtandi in December 2015 and have tolerated it well. His PSA have dropped dramatically from 31.56 down to 1.44. He has tolerated this medication well and the plan is to continue with the current dose and schedule.  CT scan on 11/07/2014 showed radiographic response to systemic therapy. His pulmonary nodules have improved.  2. Androgen depravation: Have recommended he continues Lupron which he has been receiving every 6 months. We will check his testosterone level and restart androgen deprivation if needed to.  3. Bone directed therapy:  This has been on hold for the time being I will address in future dates.  4. Pulmonary nodules: This could be suspicious for pulmonary metastasis. CT scan on 11/07/2014  showed his pulmonary lesions are actually improving  which might suggest pulmonary metastasis that are responding to therapy.  5. Follow-up: Will be in 6 weeks to check for any new complications related to this medication.    Zola Button, MD 8/3/20161:45 PM

## 2015-02-20 NOTE — Telephone Encounter (Signed)
Gave adn printed appt sched and avs for pt for Sept °

## 2015-02-21 LAB — PSA: PSA: 1.14 ng/mL (ref <=4.00)

## 2015-02-21 NOTE — Patient Outreach (Signed)
[place] [facility]) Care Management  02/21/2015  Justin Porter Jan 15, 1929 [id number]   Referral from [place] [facility], assigned Kandis Mannan, RN to outreach.  [name]. [name], [place] [facility] [place] Assistant Phone: [phone] Fax: [phone]

## 2015-03-05 ENCOUNTER — Other Ambulatory Visit: Payer: Self-pay | Admitting: *Deleted

## 2015-03-05 NOTE — Patient Outreach (Signed)
Call to patient for screening. Patient states he is hard of hearing, requests that his wife call RNCM back when she is home. He gives permission for [facility] to speak with his wife. Plan to await call back from wife. Royetta Crochet. Laymond Purser, RN, BSN, [place] [phone]

## 2015-03-08 DIAGNOSIS — I872 Venous insufficiency (chronic) (peripheral): Secondary | ICD-10-CM | POA: Diagnosis not present

## 2015-03-08 DIAGNOSIS — D1801 Hemangioma of skin and subcutaneous tissue: Secondary | ICD-10-CM | POA: Diagnosis not present

## 2015-03-08 DIAGNOSIS — D692 Other nonthrombocytopenic purpura: Secondary | ICD-10-CM | POA: Diagnosis not present

## 2015-03-08 DIAGNOSIS — D225 Melanocytic nevi of trunk: Secondary | ICD-10-CM | POA: Diagnosis not present

## 2015-03-08 DIAGNOSIS — Z85828 Personal history of other malignant neoplasm of skin: Secondary | ICD-10-CM | POA: Diagnosis not present

## 2015-03-08 DIAGNOSIS — L821 Other seborrheic keratosis: Secondary | ICD-10-CM | POA: Diagnosis not present

## 2015-03-08 DIAGNOSIS — I8311 Varicose veins of right lower extremity with inflammation: Secondary | ICD-10-CM | POA: Diagnosis not present

## 2015-03-08 DIAGNOSIS — I8312 Varicose veins of left lower extremity with inflammation: Secondary | ICD-10-CM | POA: Diagnosis not present

## 2015-03-08 DIAGNOSIS — L570 Actinic keratosis: Secondary | ICD-10-CM | POA: Diagnosis not present

## 2015-03-08 DIAGNOSIS — D2261 Melanocytic nevi of right upper limb, including shoulder: Secondary | ICD-10-CM | POA: Diagnosis not present

## 2015-03-08 DIAGNOSIS — D2272 Melanocytic nevi of left lower limb, including hip: Secondary | ICD-10-CM | POA: Diagnosis not present

## 2015-03-11 DIAGNOSIS — Z0000 Encounter for general adult medical examination without abnormal findings: Secondary | ICD-10-CM | POA: Diagnosis not present

## 2015-03-11 DIAGNOSIS — I872 Venous insufficiency (chronic) (peripheral): Secondary | ICD-10-CM | POA: Diagnosis not present

## 2015-03-11 DIAGNOSIS — R7309 Other abnormal glucose: Secondary | ICD-10-CM | POA: Diagnosis not present

## 2015-03-11 DIAGNOSIS — I10 Essential (primary) hypertension: Secondary | ICD-10-CM | POA: Diagnosis not present

## 2015-03-11 DIAGNOSIS — E785 Hyperlipidemia, unspecified: Secondary | ICD-10-CM | POA: Diagnosis not present

## 2015-03-11 DIAGNOSIS — C61 Malignant neoplasm of prostate: Secondary | ICD-10-CM | POA: Diagnosis not present

## 2015-03-11 DIAGNOSIS — Z1389 Encounter for screening for other disorder: Secondary | ICD-10-CM | POA: Diagnosis not present

## 2015-03-15 ENCOUNTER — Other Ambulatory Visit: Payer: Self-pay | Admitting: *Deleted

## 2015-03-15 NOTE — Patient Outreach (Signed)
[facility] screening call. Call to patient home, left Voicemail with RNCM contact information Plan to await call back, if no call back will attempt again next week. Justin Porter. Laymond Purser, RN, BSN, [place] [phone]

## 2015-03-20 ENCOUNTER — Other Ambulatory Visit: Payer: Self-pay | Admitting: *Deleted

## 2015-03-20 NOTE — Patient Outreach (Signed)
[facility] program screening. Call to patient, no answer, Voicemail left with RNCM contact number. Multiple calls with no return call, will send outreach letter. Plan to close per protocol if no response.  Royetta Crochet. Laymond Purser, RN, BSN, [place] [phone]

## 2015-04-03 ENCOUNTER — Encounter: Payer: Self-pay | Admitting: *Deleted

## 2015-04-03 ENCOUNTER — Ambulatory Visit (HOSPITAL_BASED_OUTPATIENT_CLINIC_OR_DEPARTMENT_OTHER): Payer: Medicare Other | Admitting: Oncology

## 2015-04-03 ENCOUNTER — Telehealth: Payer: Self-pay | Admitting: Oncology

## 2015-04-03 ENCOUNTER — Other Ambulatory Visit (HOSPITAL_BASED_OUTPATIENT_CLINIC_OR_DEPARTMENT_OTHER): Payer: Medicare Other

## 2015-04-03 VITALS — BP 148/77 | HR 83 | Temp 98.2°F | Resp 18 | Ht 72.0 in | Wt 183.9 lb

## 2015-04-03 DIAGNOSIS — C61 Malignant neoplasm of prostate: Secondary | ICD-10-CM

## 2015-04-03 LAB — CBC WITH DIFFERENTIAL/PLATELET
BASO%: 1.1 % (ref 0.0–2.0)
Basophils Absolute: 0 10*3/uL (ref 0.0–0.1)
EOS%: 1.7 % (ref 0.0–7.0)
Eosinophils Absolute: 0.1 10*3/uL (ref 0.0–0.5)
HEMATOCRIT: 41 % (ref 38.4–49.9)
HGB: 13.7 g/dL (ref 13.0–17.1)
LYMPH#: 1.1 10*3/uL (ref 0.9–3.3)
LYMPH%: 25.5 % (ref 14.0–49.0)
MCH: 31.8 pg (ref 27.2–33.4)
MCHC: 33.3 g/dL (ref 32.0–36.0)
MCV: 95.3 fL (ref 79.3–98.0)
MONO#: 0.3 10*3/uL (ref 0.1–0.9)
MONO%: 7.9 % (ref 0.0–14.0)
NEUT%: 63.8 % (ref 39.0–75.0)
NEUTROS ABS: 2.8 10*3/uL (ref 1.5–6.5)
Platelets: 229 10*3/uL (ref 140–400)
RBC: 4.3 10*6/uL (ref 4.20–5.82)
RDW: 13.5 % (ref 11.0–14.6)
WBC: 4.4 10*3/uL (ref 4.0–10.3)

## 2015-04-03 LAB — COMPREHENSIVE METABOLIC PANEL (CC13)
ALT: 8 U/L (ref 0–55)
AST: 15 U/L (ref 5–34)
Albumin: 3.8 g/dL (ref 3.5–5.0)
Alkaline Phosphatase: 101 U/L (ref 40–150)
Anion Gap: 10 mEq/L (ref 3–11)
BUN: 12.9 mg/dL (ref 7.0–26.0)
CO2: 30 meq/L — AB (ref 22–29)
CREATININE: 1 mg/dL (ref 0.7–1.3)
Calcium: 9.3 mg/dL (ref 8.4–10.4)
Chloride: 100 mEq/L (ref 98–109)
EGFR: 72 mL/min/{1.73_m2} — ABNORMAL LOW (ref >90)
Glucose: 132 mg/dl (ref 70–140)
Potassium: 3.9 mEq/L (ref 3.5–5.1)
Sodium: 139 mEq/L (ref 136–145)
TOTAL PROTEIN: 6.9 g/dL (ref 6.4–8.3)
Total Bilirubin: 0.48 mg/dL (ref 0.20–1.20)

## 2015-04-03 NOTE — Telephone Encounter (Signed)
Pt confirmed labs/ov per 09/14 POF, gave pt AVS and Calendar.... KJ

## 2015-04-03 NOTE — Patient Outreach (Signed)
[place] [facility]) Care Management  04/03/2015  DRAYKE GRABEL [birth year] [id number]  No response to unable to contact letter. Screening closed. RNCM signing off. Royetta Crochet. Laymond Purser, RN, BSN, [place] [phone]

## 2015-04-03 NOTE — Progress Notes (Signed)
Hematology and Oncology Follow Up Visit  Justin Porter [id number] 04-May-1929 81 y.o. 04/03/2015 1:16 PM Justin Porter, MDHusain, Justin Ar, MD   Principle Diagnosis: 81 year old gentleman with Castration resistant metastatic prostate cancer with disease to the bone. His initial diagnosis was in 1995.  Prior Therapy:  He is status post prostatectomy and subsequently developed biochemical relapse treated with salvage radiation therapy. He did develop presumed pulmonary metastasis status post surgical resection in 2003. The pathology confirmed the presence of adenocarcinoma from a prostate etiology. He has been on androgen depravation initially intermittently and subsequently continuously since 2013. Most recently he developed a rise in his PSA with a doubling time less than 6 months. He also has bony metastasis.    Current therapy:   Androgen deprivation.  Xtandi started and December 2015.   Interim History:   Justin Porter presents today for a followup visit with his wife. Since the last visit, he reports no complications. He reports feeling the best he has felt for a long while. He is able to hip balls and ready to a round of golf in the near future. His mobility is back to baseline and does not use any walker or wheelchair. Has not reported any falls.  He continues to be on Xtandi without any major complications. He does not report any excessive fatigue. He does not report any GI toxicities. He does not report anybone pain. He continues to ambulate without any major difficulty. Did not report any new respiratory symptoms such as cough, hemoptysis or difficulty breathing.  He does not report any constitutional symptoms of fevers or chills or weight loss. He does not report any neurological symptoms of headaches or blurry vision or syncope. Is not reporting any seizures or dementia. He has not reported any chest pain or palpitation. He has not reported any cough or hemoptysis. He has not reported  any nausea or vomiting or abdominal pain. He does not report any frequency urgency or hesitancy or hematuria. He does not report any lymphadenopathy or petechiae. He does not report any skin rashes or lesions. He does not report any depression or side he does report hearing loss. Remainder of his review of systems unremarkable.      Medications: I have reviewed the patient's current medications.  Current Outpatient Prescriptions  Medication Sig Dispense Refill  . calcium citrate-vitamin D (CITRACAL+D) 315-200 MG-UNIT per tablet Take 1 tablet by mouth every morning.     . enzalutamide (XTANDI) 40 MG capsule Take 4 capsules (160 mg total) by mouth daily. 120 capsule 0  . furosemide (LASIX) 20 MG tablet Take 20 mg by mouth 2 (two) times daily.    . Multiple Vitamin (MULITIVITAMIN WITH MINERALS) TABS Take 1 tablet by mouth every morning.    . polyethylene glycol (MIRALAX / GLYCOLAX) packet Take 17 g by mouth 2 (two) times daily. 14 each 0  . Potassium 99 MG TABS Take 1 tablet by mouth every morning.    . simvastatin (ZOCOR) 20 MG tablet Take 20 mg by mouth every evening.     . vitamin E 1000 UNIT capsule Take 1,000 Units by mouth every morning.     No current facility-administered medications for this visit.     Allergies:  Allergies  Allergen Reactions  . Codeine Nausea And Vomiting  . Shrimp [Shellfish Allergy] Nausea And Vomiting  . Betadine [Povidone Iodine] Rash    Past Medical History, Surgical history, Social history, and Family History were reviewed and updated.   Physical Exam: Blood  pressure 148/77, pulse 83, temperature 98.2 F (36.8 C), temperature source Oral, resp. rate 18, height 6' (1.829 m), weight 183 lb 14.4 oz (83.416 kg), SpO2 100 %. ECOG: 1 General appearance: alert and cooperative.  Elderly gentleman that is well-appearing without distress. Head: Normocephalic, without obvious abnormality Neck: no adenopathy Lymph nodes: Cervical, supraclavicular, and axillary  nodes normal. Heart:regular rate and rhythm, S1, S2 normal, no murmur, click, rub or gallop Lung:chest clear, no wheezing, rales, normal symmetric air entry no wheezes or dullness to percussion. Abdomin: soft, non-tender, without masses or organomegaly EXT: trace edema bilateral lower extremities. Neurological examination: No deficits noted.   Lab Results: Lab Results  Component Value Date   WBC 4.4 04/03/2015   HGB 13.7 04/03/2015   HCT 41.0 04/03/2015   MCV 95.3 04/03/2015   PLT 229 04/03/2015     Chemistry      Component Value Date/Time   NA 139 02/20/2015 1315   NA 142 05/23/2014 0423   K 4.4 02/20/2015 1315   K 4.2 05/23/2014 0423   CL 102 05/23/2014 0423   CO2 30* 02/20/2015 1315   CO2 26 05/23/2014 0423   BUN 12.1 02/20/2015 1315   BUN 19 05/23/2014 0423   CREATININE 0.9 02/20/2015 1315   CREATININE 0.82 05/23/2014 0423   CREATININE 0.75 10/29/2011 1545      Component Value Date/Time   CALCIUM 9.1 02/20/2015 1315   CALCIUM 8.7 05/23/2014 0423   ALKPHOS 107 02/20/2015 1315   ALKPHOS 83 12/14/2011 1044   AST 16 02/20/2015 1315   AST 21 12/14/2011 1044   ALT 12 02/20/2015 1315   ALT 25 12/14/2011 1044   BILITOT 0.38 02/20/2015 1315   BILITOT 0.4 12/14/2011 1044         Results for Justin, Porter (MRN [id number]) as of 04/03/2015 13:25  Ref. Range 08/22/2014 13:20 09/26/2014 15:08 11/07/2014 14:17 01/04/2015 13:15 02/20/2015 13:15  PSA Latest Ref Range: <=4.00 ng/mL 3.03 1.80 1.34 1.44 1.38       81 year old gentleman with the following issues:   1. Castration resistant metastatic prostate cancer with disease to the bone. His initial diagnosis was in 1995 status post prostatectomy and subsequently developed biochemical relapse treated with salvage radiation therapy.  He developed lung recurrence in 2003 and underwent surgical resection and the pathology reviewed above. PSA was up to 31.5 with a doubling time of less then 6 months. CT scan of the chest on  06/11/2014 showed disease relapse with a PSA up to 31.   He was started on Xtandi in December 2015 and have tolerated it well. His PSA have dropped dramatically from 31.56 down to 1.14. He has tolerated this medication well and the plan is to continue with the current dose and schedule.  CT scan on 11/07/2014 showed radiographic response to systemic therapy. His pulmonary nodules have improved.  Unless he develops symptomatic progression from his disease, we will keep him on this current dose and schedule. Alternative therapy will be used upon progression.  2. Androgen depravation: Have recommended he continues Lupron which he has been receiving every 6 months. We will check his testosterone level and restart androgen deprivation if needed to.  3. Bone directed therapy:  This has been on hold for the time being.   4. Pulmonary nodules: This could be suspicious for pulmonary metastasis. CT scan on 11/07/2014  showed his pulmonary lesions are actually improving which might suggest pulmonary metastasis that are responding to therapy.  5. Follow-up: Will be in  6 weeks for a check up.    [facility], MD 9/14/20161:16 PM

## 2015-04-04 LAB — PSA: PSA: 1.23 ng/mL (ref <=4.00)

## 2015-04-04 LAB — TESTOSTERONE: Testosterone: 131 ng/dL — ABNORMAL LOW (ref 300–890)

## 2015-04-05 NOTE — Patient Outreach (Signed)
[place] [facility]) Care Management  04/05/2015  Justin Porter [birth year] [id number]   Notification from Kandis Mannan, RN to close case due to unable to contact patient for [place] Management services.  Thanks, [name]. [place], [place] Assistant Phone: [phone] Fax: [phone]

## 2015-05-08 ENCOUNTER — Other Ambulatory Visit: Payer: Self-pay | Admitting: Nurse Practitioner

## 2015-05-08 ENCOUNTER — Ambulatory Visit
Admission: RE | Admit: 2015-05-08 | Discharge: 2015-05-08 | Disposition: A | Discharge status: Home / Self Care | Payer: Medicare Other | Source: Ambulatory Visit | Attending: Nurse Practitioner | Admitting: Nurse Practitioner

## 2015-05-08 DIAGNOSIS — R0781 Pleurodynia: Secondary | ICD-10-CM

## 2015-05-08 DIAGNOSIS — R0789 Other chest pain: Secondary | ICD-10-CM

## 2015-05-22 ENCOUNTER — Ambulatory Visit (HOSPITAL_BASED_OUTPATIENT_CLINIC_OR_DEPARTMENT_OTHER): Payer: Medicare Other | Admitting: Oncology

## 2015-05-22 ENCOUNTER — Other Ambulatory Visit (HOSPITAL_BASED_OUTPATIENT_CLINIC_OR_DEPARTMENT_OTHER): Payer: Medicare Other

## 2015-05-22 ENCOUNTER — Telehealth: Payer: Self-pay | Admitting: Oncology

## 2015-05-22 VITALS — BP 154/68 | HR 78 | Temp 98.1°F | Resp 17 | Ht 72.0 in | Wt 187.6 lb

## 2015-05-22 DIAGNOSIS — C7951 Secondary malignant neoplasm of bone: Secondary | ICD-10-CM

## 2015-05-22 DIAGNOSIS — R918 Other nonspecific abnormal finding of lung field: Secondary | ICD-10-CM | POA: Diagnosis not present

## 2015-05-22 DIAGNOSIS — C61 Malignant neoplasm of prostate: Secondary | ICD-10-CM

## 2015-05-22 LAB — COMPREHENSIVE METABOLIC PANEL (CC13)
ALBUMIN: 4 g/dL (ref 3.5–5.0)
ALK PHOS: 129 U/L (ref 40–150)
ALT: 11 U/L (ref 0–55)
ANION GAP: 7 meq/L (ref 3–11)
AST: 17 U/L (ref 5–34)
BUN: 17.2 mg/dL (ref 7.0–26.0)
CALCIUM: 9.7 mg/dL (ref 8.4–10.4)
CO2: 32 mEq/L — ABNORMAL HIGH (ref 22–29)
Chloride: 102 mEq/L (ref 98–109)
Creatinine: 1 mg/dL (ref 0.7–1.3)
EGFR: 69 mL/min/{1.73_m2} — AB (ref >90)
Glucose: 90 mg/dl (ref 70–140)
POTASSIUM: 4.8 meq/L (ref 3.5–5.1)
SODIUM: 141 meq/L (ref 136–145)
Total Bilirubin: 0.45 mg/dL (ref 0.20–1.20)
Total Protein: 7.3 g/dL (ref 6.4–8.3)

## 2015-05-22 LAB — CBC WITH DIFFERENTIAL/PLATELET
BASO%: 1 % (ref 0.0–2.0)
BASOS ABS: 0.1 10*3/uL (ref 0.0–0.1)
EOS ABS: 0.1 10*3/uL (ref 0.0–0.5)
EOS%: 2.1 % (ref 0.0–7.0)
HEMATOCRIT: 41.4 % (ref 38.4–49.9)
HEMOGLOBIN: 13.9 g/dL (ref 13.0–17.1)
LYMPH%: 23.9 % (ref 14.0–49.0)
MCH: 32.2 pg (ref 27.2–33.4)
MCHC: 33.5 g/dL (ref 32.0–36.0)
MCV: 96.2 fL (ref 79.3–98.0)
MONO#: 0.5 10*3/uL (ref 0.1–0.9)
MONO%: 8.7 % (ref 0.0–14.0)
NEUT#: 3.7 10*3/uL (ref 1.5–6.5)
NEUT%: 64.3 % (ref 39.0–75.0)
PLATELETS: 258 10*3/uL (ref 140–400)
RBC: 4.3 10*6/uL (ref 4.20–5.82)
RDW: 13.1 % (ref 11.0–14.6)
WBC: 5.8 10*3/uL (ref 4.0–10.3)
lymph#: 1.4 10*3/uL (ref 0.9–3.3)

## 2015-05-22 NOTE — Progress Notes (Signed)
Hematology and Oncology Follow Up Visit  Justin Porter [id number] October 27, 1928 81 y.o. 05/22/2015 1:27 PM Justin Porter, MDHusain, Justin Ar, MD   Principle Diagnosis: 81 year old gentleman with castration resistant metastatic prostate cancer with disease to the lung. His initial diagnosis was in 1995.  Prior Therapy:  He is status post prostatectomy and subsequently developed biochemical relapse treated with salvage radiation therapy. He did develop presumed pulmonary metastasis status post surgical resection in 2003. The pathology confirmed the presence of adenocarcinoma from a prostate etiology. He has been on androgen depravation initially intermittently and subsequently continuously since 2013. He developed a rise in his PSA up to 31.56 with a doubling time less than 6 months in 05/2014. He also has bony metastasis.    Current therapy:   Androgen deprivation.  Xtandi started and December 2015 with a PSA response from 31.5 down to a nadir of close to 1.2.  Interim History:   Mr. Justin Porter presents today for a followup visit with his wife. Since the last visit, he reports slow but steady improvement in his performance status. He is able to ambulate without any difficulties and was able to play golf 4 few times and have done well overall. His mobility is back to baseline and does not use any walker or wheelchair. Has not reported any falls.  He continues to be on Xtandi without any major complications. He does not report any excessive fatigue. He does not report any GI toxicities. He does not report anybone pain. Did not report any new respiratory symptoms such as cough, hemoptysis or difficulty breathing.  He does not report any constitutional symptoms of fevers or chills or weight loss. He does not report any neurological symptoms of headaches or blurry vision or syncope. Is not reporting any seizures or dementia. He has not reported any chest pain or palpitation. He has not reported any cough  or hemoptysis. He has not reported any nausea or vomiting or abdominal pain. He does not report any frequency urgency or hesitancy or hematuria. He does not report any lymphadenopathy or petechiae. He does not report any skin rashes or lesions. He does not report any depression or side he does report hearing loss. Remainder of his review of systems unremarkable.      Medications: I have reviewed the patient's current medications.  Current Outpatient Prescriptions  Medication Sig Dispense Refill  . calcium citrate-vitamin D (CITRACAL+D) 315-200 MG-UNIT per tablet Take 1 tablet by mouth every morning.     . enzalutamide (XTANDI) 40 MG capsule Take 4 capsules (160 mg total) by mouth daily. 120 capsule 0  . furosemide (LASIX) 20 MG tablet Take 20 mg by mouth 2 (two) times daily.    . Multiple Vitamin (MULITIVITAMIN WITH MINERALS) TABS Take 1 tablet by mouth every morning.    . polyethylene glycol (MIRALAX / GLYCOLAX) packet Take 17 g by mouth 2 (two) times daily. 14 each 0  . Potassium 99 MG TABS Take 1 tablet by mouth every morning.    . simvastatin (ZOCOR) 20 MG tablet Take 20 mg by mouth every evening.     . vitamin E 1000 UNIT capsule Take 1,000 Units by mouth every morning.     No current facility-administered medications for this visit.     Allergies:  Allergies  Allergen Reactions  . Codeine Nausea And Vomiting  . Shrimp [Shellfish Allergy] Nausea And Vomiting  . Betadine [Povidone Iodine] Rash    Past Medical History, Surgical history, Social history, and Family History were reviewed  and updated.   Physical Exam: Blood pressure 154/68, pulse 78, temperature 98.1 F (36.7 C), temperature source Oral, resp. rate 17, height 6' (1.829 m), weight 187 lb 9.6 oz (85.095 kg), SpO2 99 %. ECOG: 1 General appearance: alert and cooperative. Not in any distress. Head: Normocephalic, without obvious abnormality no oral ulcers or lesions. Neck: no adenopathy Lymph nodes: Cervical,  supraclavicular, and axillary nodes normal. Heart:regular rate and rhythm, S1, S2 normal, no murmur, click, rub or gallop Lung:chest clear, no wheezing, rales, normal symmetric air entry no wheezes or dullness to percussion. Abdomin: soft, non-tender, without masses or organomegaly EXT: trace edema bilateral lower extremities. Neurological examination: No deficits noted.   Lab Results: Lab Results  Component Value Date   WBC 5.8 05/22/2015   HGB 13.9 05/22/2015   HCT 41.4 05/22/2015   MCV 96.2 05/22/2015   PLT 258 05/22/2015     Chemistry      Component Value Date/Time   NA 139 04/03/2015 1240   NA 142 05/23/2014 0423   K 3.9 04/03/2015 1240   K 4.2 05/23/2014 0423   CL 102 05/23/2014 0423   CO2 30* 04/03/2015 1240   CO2 26 05/23/2014 0423   BUN 12.9 04/03/2015 1240   BUN 19 05/23/2014 0423   CREATININE 1.0 04/03/2015 1240   CREATININE 0.82 05/23/2014 0423   CREATININE 0.75 10/29/2011 1545      Component Value Date/Time   CALCIUM 9.3 04/03/2015 1240   CALCIUM 8.7 05/23/2014 0423   ALKPHOS 101 04/03/2015 1240   ALKPHOS 83 12/14/2011 1044   AST 15 04/03/2015 1240   AST 21 12/14/2011 1044   ALT 8 04/03/2015 1240   ALT 25 12/14/2011 1044   BILITOT 0.48 04/03/2015 1240   BILITOT 0.4 12/14/2011 1044       Results for Justin Porter, Justin Porter (MRN [id number]) as of 05/22/2015 13:16  Ref. Range 01/04/2015 13:15 02/20/2015 13:15 04/03/2015 12:40  PSA Latest Ref Range: <=4.00 ng/mL 1.44 1.14 1.23          81 year old gentleman with the following issues:   1. Castration resistant metastatic prostate cancer with disease to the bone. His initial diagnosis was in 1995 status post prostatectomy and subsequently developed biochemical relapse treated with salvage radiation therapy.  He developed lung recurrence in 2003 and underwent surgical resection and the pathology reviewed above. PSA was up to 31.5 with a doubling time of less then 6 months. CT scan of the chest on 06/11/2014  showed disease relapse with a PSA up to 31.   He was started on Xtandi in December 2015 and have tolerated it well. His PSA have dropped dramatically from 31.56 down to 1.14. He has tolerated this medication well and I see no reason for any dose reduction or delay. His PSA continues to be under excellent control with his most recent PSA on 04/03/2015 was 1.23.  Alternative therapy will be used upon progression.  2. Androgen depravation: Have recommended he continues Lupron which he has been receiving every 6 months. We will check his testosterone level and restart androgen deprivation if needed to.  3. Bone directed therapy: He has declined that in the past but we will continue to address it with him in the future.  4. Pulmonary nodules: This could be suspicious for pulmonary metastasis. CT scan on 11/07/2014  showed his pulmonary lesions are actually improving which might suggest pulmonary metastasis that are responding to therapy. Imaging studies will be repeated in April 2017.  5. Follow-up: Will be  in 8 weeks for a check up.    Zola Button, MD 11/2/20161:27 PM

## 2015-05-22 NOTE — Telephone Encounter (Signed)
Gave and printed appt sched and avs for pt for Jan 2017 °

## 2015-05-23 LAB — PSA: PSA: 1.56 ng/mL (ref <=4.00)

## 2015-06-03 DIAGNOSIS — S2239XA Fracture of one rib, unspecified side, initial encounter for closed fracture: Secondary | ICD-10-CM | POA: Diagnosis not present

## 2015-06-19 ENCOUNTER — Other Ambulatory Visit: Payer: Self-pay | Admitting: *Deleted

## 2015-06-19 ENCOUNTER — Other Ambulatory Visit: Payer: Self-pay | Admitting: Oncology

## 2015-06-19 DIAGNOSIS — C61 Malignant neoplasm of prostate: Secondary | ICD-10-CM

## 2015-06-19 MED ORDER — ENZALUTAMIDE 40 MG PO CAPS: 160.0000 mg | ORAL_CAPSULE | Freq: Every day | ORAL | Status: DC

## 2015-07-17 ENCOUNTER — Other Ambulatory Visit: Payer: Self-pay | Admitting: Oncology

## 2015-07-31 ENCOUNTER — Telehealth: Payer: Self-pay | Admitting: Oncology

## 2015-07-31 ENCOUNTER — Other Ambulatory Visit (HOSPITAL_BASED_OUTPATIENT_CLINIC_OR_DEPARTMENT_OTHER): Payer: Medicare Other

## 2015-07-31 ENCOUNTER — Ambulatory Visit (HOSPITAL_BASED_OUTPATIENT_CLINIC_OR_DEPARTMENT_OTHER): Payer: Medicare Other | Admitting: Oncology

## 2015-07-31 VITALS — BP 159/76 | HR 80 | Temp 98.2°F | Resp 18 | Ht 72.0 in | Wt 182.5 lb

## 2015-07-31 DIAGNOSIS — C7951 Secondary malignant neoplasm of bone: Secondary | ICD-10-CM | POA: Diagnosis not present

## 2015-07-31 DIAGNOSIS — C61 Malignant neoplasm of prostate: Secondary | ICD-10-CM

## 2015-07-31 DIAGNOSIS — R918 Other nonspecific abnormal finding of lung field: Secondary | ICD-10-CM | POA: Diagnosis not present

## 2015-07-31 LAB — COMPREHENSIVE METABOLIC PANEL
ALT: 10 U/L (ref 0–55)
AST: 16 U/L (ref 5–34)
Albumin: 3.8 g/dL (ref 3.5–5.0)
Alkaline Phosphatase: 122 U/L (ref 40–150)
Anion Gap: 10 mEq/L (ref 3–11)
BUN: 11.5 mg/dL (ref 7.0–26.0)
CHLORIDE: 99 meq/L (ref 98–109)
CO2: 29 meq/L (ref 22–29)
Calcium: 9.4 mg/dL (ref 8.4–10.4)
Creatinine: 1 mg/dL (ref 0.7–1.3)
EGFR: 70 mL/min/{1.73_m2} — ABNORMAL LOW (ref >90)
GLUCOSE: 90 mg/dL (ref 70–140)
POTASSIUM: 4.1 meq/L (ref 3.5–5.1)
SODIUM: 139 meq/L (ref 136–145)
Total Bilirubin: 0.38 mg/dL (ref 0.20–1.20)
Total Protein: 7.5 g/dL (ref 6.4–8.3)

## 2015-07-31 LAB — CBC WITH DIFFERENTIAL/PLATELET
BASO%: 0.5 % (ref 0.0–2.0)
BASOS ABS: 0 10*3/uL (ref 0.0–0.1)
EOS ABS: 0.1 10*3/uL (ref 0.0–0.5)
EOS%: 1.9 % (ref 0.0–7.0)
HCT: 42.2 % (ref 38.4–49.9)
HGB: 14 g/dL (ref 13.0–17.1)
LYMPH%: 25.1 % (ref 14.0–49.0)
MCH: 31.7 pg (ref 27.2–33.4)
MCHC: 33.2 g/dL (ref 32.0–36.0)
MCV: 95.7 fL (ref 79.3–98.0)
MONO#: 0.5 10*3/uL (ref 0.1–0.9)
MONO%: 9.4 % (ref 0.0–14.0)
NEUT%: 63.1 % (ref 39.0–75.0)
NEUTROS ABS: 3.6 10*3/uL (ref 1.5–6.5)
PLATELETS: 254 10*3/uL (ref 140–400)
RBC: 4.41 10*6/uL (ref 4.20–5.82)
RDW: 12.5 % (ref 11.0–14.6)
WBC: 5.8 10*3/uL (ref 4.0–10.3)
lymph#: 1.5 10*3/uL (ref 0.9–3.3)

## 2015-07-31 NOTE — Telephone Encounter (Signed)
Gave wife avs report and appointments for March

## 2015-07-31 NOTE — Progress Notes (Signed)
Hematology and Oncology Follow Up Visit  Justin Porter [id number] 01/21/1929 82 y.o. 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 His initial diagnosis was in 1995.  Prior Therapy:  He is status post prostatectomy and subsequently developed biochemical relapse treated with salvage radiation therapy. He did develop presumed pulmonary metastasis status post surgical resection in 2003. The pathology confirmed the presence of adenocarcinoma from a prostate etiology. He has been on androgen depravation initially intermittently and subsequently continuously since 2013. He developed a rise in his PSA up to 31.56 with a doubling time less than 6 months in 05/2014. He also has bony metastasis.    Current therapy:   Androgen deprivation.  Xtandi started and December 2015 with a PSA response from 31.5 down to a nadir of close to 1.2.  Interim History:   [name] presents today for a followup visit with his wife. Since the last visit, he reports no recent complaints. He continues to have improvement in his performance status without any decline. He is able to ambulate without any recent falls or syncope. He is able to participate in bowling and have been doing fairly well. His appetite is reasonable and weight have been stable.  He continues to be on Xtandi without any major complications. He does not report any GI toxicities including nausea, abdominal pain or dyspepsia. He does not report anybone pain. Did not report any new respiratory symptoms such as cough, hemoptysis or difficulty breathing.  He does not report any constitutional symptoms of fevers or chills. He does not report any neurological symptoms of headaches or blurry vision or syncope. Is not reporting any seizures or dementia. He has not reported any chest pain or palpitation. He has not reported any  cough or hemoptysis.  He does not report any frequency urgency or hesitancy or hematuria. He does not report any lymphadenopathy or petechiae. He does not report any skin rashes or lesions. He does not report any depression or side he does report hearing loss. Remainder of his review of systems unremarkable.      Medications: I have reviewed the patient's current medications.  Current Outpatient Prescriptions  Medication Sig Dispense Refill  . calcium citrate-vitamin D (CITRACAL+D) 315-200 MG-UNIT per tablet Take 1 tablet by mouth every morning.     . furosemide (LASIX) 20 MG tablet Take 20 mg by mouth 2 (two) times daily.    . Multiple Vitamin (MULITIVITAMIN WITH MINERALS) TABS Take 1 tablet by mouth every morning.    . polyethylene glycol (MIRALAX / GLYCOLAX) packet Take 17 g by mouth 2 (two) times daily. 14 each 0  . Potassium 99 MG TABS Take 1 tablet by mouth every morning.    . simvastatin (ZOCOR) 20 MG tablet Take 20 mg by mouth every evening.     [name] 40 MG capsule TAKE 4 CAPSULES ('160MG'$ ) BY MOUTH DAILY 120 capsule 0   No current facility-administered medications for this visit.     Allergies:  Allergies  Allergen Reactions  . Codeine Nausea And Vomiting  . Shrimp [Shellfish Allergy] Nausea And Vomiting  . Betadine [Povidone Iodine] Rash    Past Medical History, Surgical history, Social history, and Family History were reviewed and updated.   Physical Exam: Blood pressure 159/76, pulse 80, temperature 98.2 F (36.8 C), temperature source Oral, resp. rate 18, height 6' (1.829 m), weight 182 lb 8 oz (82.781 kg), SpO2  99 %. ECOG: 1 General appearance: alert and cooperative. Well-appearing gentleman without distress. Head: Normocephalic, without obvious abnormality no thrush noted. Neck: no adenopathy Lymph nodes: Cervical, supraclavicular, and axillary nodes normal. Heart:regular rate and rhythm, S1, S2 normal, no murmur, click, rub or gallop Lung:chest clear, no  wheezing, rales, normal symmetric air entry  Abdomin: soft, non-tender, without masses or organomegaly no shifting dullness or ascites. EXT: trace edema bilateral lower extremities. Neurological examination: No deficits noted.   Lab Results: Lab Results  Component Value Date   WBC 5.8 07/31/2015   HGB 14.0 07/31/2015   HCT 42.2 07/31/2015   MCV 95.7 07/31/2015   PLT 254 07/31/2015     Chemistry      Component Value Date/Time   NA 141 05/22/2015 1301   NA 142 05/23/2014 0423   K 4.8 05/22/2015 1301   K 4.2 05/23/2014 0423   CL 102 05/23/2014 0423   CO2 32* 05/22/2015 1301   CO2 26 05/23/2014 0423   BUN 17.2 05/22/2015 1301   BUN 19 05/23/2014 0423   CREATININE 1.0 05/22/2015 1301   CREATININE 0.82 05/23/2014 0423   CREATININE 0.75 10/29/2011 1545      Component Value Date/Time   CALCIUM 9.7 05/22/2015 1301   CALCIUM 8.7 05/23/2014 0423   ALKPHOS 129 05/22/2015 1301   ALKPHOS 83 12/14/2011 1044   AST 17 05/22/2015 1301   AST 21 12/14/2011 1044   ALT 11 05/22/2015 1301   ALT 25 12/14/2011 1044   BILITOT 0.45 05/22/2015 1301   BILITOT 0.4 12/14/2011 1044         Results for WILSON, DUSENBERY (MRN [id number]) as of 07/31/2015 13:13  Ref. Range 02/20/2015 13:15 04/03/2015 12:40 05/22/2015 13:01  PSA Latest Ref Range: <=4.00 ng/mL 1.14 1.23 1.53         82 year old gentleman with the following issues:   1. Castration resistant metastatic prostate cancer with disease to the bone. His initial diagnosis was in 1995 status post prostatectomy and subsequently developed biochemical relapse treated with salvage radiation therapy.  He developed lung recurrence in 2003 and underwent surgical resection and the pathology reviewed above. PSA was up to 31.5 with a doubling time of less then 6 months. CT scan of the chest on 06/11/2014 showed disease relapse with a PSA up to 31.   He was started on Xtandi in December 2015 and have tolerated it well. His PSA continues to be under  excellent control although did show some slight rise up to 1.56. Last imaging studies obtained in April 2016 showed response to therapy. The plan is to continue with the same dose and schedule and change to a different salvage therapy upon symptomatic progression.   2. Androgen depravation: Have recommended he continues Lupron which he has been receiving every 6 months. We will check his testosterone level and restart androgen deprivation if needed to.  3. Bone directed therapy: He has declined that in the past but we will continue to address it with him in the future.  4. Pulmonary nodules: This could be suspicious for pulmonary metastasis. CT scan on 11/07/2014  showed his pulmonary lesions are actually improving which might suggest pulmonary metastasis that are responding to therapy. Imaging studies will be repeated in the future if his PSA starts to rise possibly in April 2017.  5. Follow-up: Will be in March 2017.   [facility], MD 1/11/20171:23 PM

## 2015-08-01 DIAGNOSIS — H35372 Puckering of macula, left eye: Secondary | ICD-10-CM | POA: Diagnosis not present

## 2015-08-01 DIAGNOSIS — H25013 Cortical age-related cataract, bilateral: Secondary | ICD-10-CM | POA: Diagnosis not present

## 2015-08-01 DIAGNOSIS — H1852 Epithelial (juvenile) corneal dystrophy: Secondary | ICD-10-CM | POA: Diagnosis not present

## 2015-08-01 DIAGNOSIS — H2513 Age-related nuclear cataract, bilateral: Secondary | ICD-10-CM | POA: Diagnosis not present

## 2015-08-01 LAB — PSA: Prostate Specific Ag, Serum: 2 ng/mL (ref 0.0–4.0)

## 2015-08-01 LAB — PSA (PARALLEL TESTING): PSA: 2.03 ng/mL (ref <=4.00)

## 2015-08-20 DIAGNOSIS — H25013 Cortical age-related cataract, bilateral: Secondary | ICD-10-CM | POA: Diagnosis not present

## 2015-08-20 DIAGNOSIS — H5703 Miosis: Secondary | ICD-10-CM | POA: Diagnosis not present

## 2015-08-20 DIAGNOSIS — H2513 Age-related nuclear cataract, bilateral: Secondary | ICD-10-CM | POA: Diagnosis not present

## 2015-09-09 DIAGNOSIS — C61 Malignant neoplasm of prostate: Secondary | ICD-10-CM | POA: Diagnosis not present

## 2015-09-09 DIAGNOSIS — E785 Hyperlipidemia, unspecified: Secondary | ICD-10-CM | POA: Diagnosis not present

## 2015-09-09 DIAGNOSIS — R7309 Other abnormal glucose: Secondary | ICD-10-CM | POA: Diagnosis not present

## 2015-09-09 DIAGNOSIS — I10 Essential (primary) hypertension: Secondary | ICD-10-CM | POA: Diagnosis not present

## 2015-09-13 ENCOUNTER — Other Ambulatory Visit: Payer: Self-pay | Admitting: Oncology

## 2015-09-18 DIAGNOSIS — H5703 Miosis: Secondary | ICD-10-CM | POA: Diagnosis not present

## 2015-09-18 DIAGNOSIS — H2512 Age-related nuclear cataract, left eye: Secondary | ICD-10-CM | POA: Diagnosis not present

## 2015-09-19 ENCOUNTER — Telehealth: Payer: Self-pay | Admitting: *Deleted

## 2015-09-19 ENCOUNTER — Encounter: Payer: Self-pay | Admitting: *Deleted

## 2015-09-19 ENCOUNTER — Other Ambulatory Visit: Payer: Self-pay | Admitting: *Deleted

## 2015-09-19 NOTE — Telephone Encounter (Signed)
Wife calling to cancel appt with dr Alen Blew on march 16th. Patient has cateract surgery on the march 15th and must go back to the clinic on march 16th. Would like to re-schedule dr Hazeline Junker appt after that. Note to dr Hazeline Junker desk.

## 2015-09-19 NOTE — Progress Notes (Signed)
pof to schedulers to move 10/03/15 appt to following week. 1st available.

## 2015-09-20 ENCOUNTER — Telehealth: Payer: Self-pay | Admitting: Oncology

## 2015-09-20 ENCOUNTER — Other Ambulatory Visit: Payer: Self-pay | Admitting: Nurse Practitioner

## 2015-09-20 NOTE — Telephone Encounter (Signed)
Spoke with patient wife to confirm appt change from 3/16 to 3/23 per 3/3 pof

## 2015-09-24 DIAGNOSIS — H25011 Cortical age-related cataract, right eye: Secondary | ICD-10-CM | POA: Diagnosis not present

## 2015-09-24 DIAGNOSIS — H2511 Age-related nuclear cataract, right eye: Secondary | ICD-10-CM | POA: Diagnosis not present

## 2015-09-25 DIAGNOSIS — Z85820 Personal history of malignant melanoma of skin: Secondary | ICD-10-CM | POA: Diagnosis not present

## 2015-09-25 DIAGNOSIS — D2271 Melanocytic nevi of right lower limb, including hip: Secondary | ICD-10-CM | POA: Diagnosis not present

## 2015-09-25 DIAGNOSIS — L814 Other melanin hyperpigmentation: Secondary | ICD-10-CM | POA: Diagnosis not present

## 2015-09-25 DIAGNOSIS — L570 Actinic keratosis: Secondary | ICD-10-CM | POA: Diagnosis not present

## 2015-09-25 DIAGNOSIS — D692 Other nonthrombocytopenic purpura: Secondary | ICD-10-CM | POA: Diagnosis not present

## 2015-09-25 DIAGNOSIS — Z85828 Personal history of other malignant neoplasm of skin: Secondary | ICD-10-CM | POA: Diagnosis not present

## 2015-09-25 DIAGNOSIS — D1801 Hemangioma of skin and subcutaneous tissue: Secondary | ICD-10-CM | POA: Diagnosis not present

## 2015-09-25 DIAGNOSIS — D2261 Melanocytic nevi of right upper limb, including shoulder: Secondary | ICD-10-CM | POA: Diagnosis not present

## 2015-09-25 DIAGNOSIS — L821 Other seborrheic keratosis: Secondary | ICD-10-CM | POA: Diagnosis not present

## 2015-09-25 DIAGNOSIS — L819 Disorder of pigmentation, unspecified: Secondary | ICD-10-CM | POA: Diagnosis not present

## 2015-09-25 DIAGNOSIS — D225 Melanocytic nevi of trunk: Secondary | ICD-10-CM | POA: Diagnosis not present

## 2015-10-02 DIAGNOSIS — H5703 Miosis: Secondary | ICD-10-CM | POA: Diagnosis not present

## 2015-10-02 DIAGNOSIS — H219 Unspecified disorder of iris and ciliary body: Secondary | ICD-10-CM | POA: Diagnosis not present

## 2015-10-02 DIAGNOSIS — H2511 Age-related nuclear cataract, right eye: Secondary | ICD-10-CM | POA: Diagnosis not present

## 2015-10-03 ENCOUNTER — Other Ambulatory Visit: Payer: Self-pay | Admitting: Nurse Practitioner

## 2015-10-03 ENCOUNTER — Ambulatory Visit: Payer: Medicare Other | Admitting: Oncology

## 2015-10-03 ENCOUNTER — Other Ambulatory Visit: Payer: Medicare Other

## 2015-10-10 ENCOUNTER — Ambulatory Visit (HOSPITAL_BASED_OUTPATIENT_CLINIC_OR_DEPARTMENT_OTHER): Payer: Medicare Other | Admitting: Nurse Practitioner

## 2015-10-10 ENCOUNTER — Telehealth: Payer: Self-pay | Admitting: Oncology

## 2015-10-10 ENCOUNTER — Other Ambulatory Visit (HOSPITAL_BASED_OUTPATIENT_CLINIC_OR_DEPARTMENT_OTHER): Payer: Medicare Other

## 2015-10-10 VITALS — BP 150/70 | HR 82 | Temp 98.1°F | Resp 17 | Ht 72.0 in | Wt 183.9 lb

## 2015-10-10 DIAGNOSIS — C61 Malignant neoplasm of prostate: Secondary | ICD-10-CM

## 2015-10-10 DIAGNOSIS — C7800 Secondary malignant neoplasm of unspecified lung: Secondary | ICD-10-CM

## 2015-10-10 DIAGNOSIS — C7951 Secondary malignant neoplasm of bone: Secondary | ICD-10-CM

## 2015-10-10 LAB — COMPREHENSIVE METABOLIC PANEL
ALBUMIN: 3.8 g/dL (ref 3.5–5.0)
ALK PHOS: 99 U/L (ref 40–150)
ALT: 10 U/L (ref 0–55)
AST: 16 U/L (ref 5–34)
Anion Gap: 8 mEq/L (ref 3–11)
BILIRUBIN TOTAL: 0.45 mg/dL (ref 0.20–1.20)
BUN: 15.7 mg/dL (ref 7.0–26.0)
CO2: 30 mEq/L — ABNORMAL HIGH (ref 22–29)
Calcium: 9.4 mg/dL (ref 8.4–10.4)
Chloride: 102 mEq/L (ref 98–109)
Creatinine: 1 mg/dL (ref 0.7–1.3)
EGFR: 70 mL/min/{1.73_m2} — AB (ref >90)
GLUCOSE: 117 mg/dL (ref 70–140)
Potassium: 3.9 mEq/L (ref 3.5–5.1)
SODIUM: 140 meq/L (ref 136–145)
TOTAL PROTEIN: 7.2 g/dL (ref 6.4–8.3)

## 2015-10-10 LAB — CBC WITH DIFFERENTIAL/PLATELET
BASO%: 0.4 % (ref 0.0–2.0)
BASOS ABS: 0 10*3/uL (ref 0.0–0.1)
EOS%: 1.8 % (ref 0.0–7.0)
Eosinophils Absolute: 0.1 10*3/uL (ref 0.0–0.5)
HEMATOCRIT: 40.5 % (ref 38.4–49.9)
HEMOGLOBIN: 13.8 g/dL (ref 13.0–17.1)
LYMPH#: 1.3 10*3/uL (ref 0.9–3.3)
LYMPH%: 23 % (ref 14.0–49.0)
MCH: 32.5 pg (ref 27.2–33.4)
MCHC: 34.1 g/dL (ref 32.0–36.0)
MCV: 95.3 fL (ref 79.3–98.0)
MONO#: 0.3 10*3/uL (ref 0.1–0.9)
MONO%: 5.8 % (ref 0.0–14.0)
NEUT%: 69 % (ref 39.0–75.0)
NEUTROS ABS: 3.9 10*3/uL (ref 1.5–6.5)
Platelets: 223 10*3/uL (ref 140–400)
RBC: 4.25 10*6/uL (ref 4.20–5.82)
RDW: 13.1 % (ref 11.0–14.6)
WBC: 5.7 10*3/uL (ref 4.0–10.3)

## 2015-10-10 NOTE — Progress Notes (Signed)
  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 His initial diagnosis was in 1995.  Prior Therapy:  He is status post prostatectomy and subsequently developed biochemical relapse treated with salvage radiation therapy. He did develop presumed pulmonary metastasis status post surgical resection in 2003. The pathology confirmed the presence of adenocarcinoma from a prostate etiology. He has been on androgen depravation initially intermittently and subsequently continuously since 2013. He developed a rise in his PSA up to 31.56 with a doubling time less than 6 months in 05/2014. He also has bony metastasis.   Current therapy:  Androgen deprivation.  Xtandi started December 2015 with a PSA response from 31.5 down to a nadir of close to 1.2.   INTERVAL HISTORY:   Justin Porter returns as scheduled. He continues Xtandi. He feels well. He has a good appetite and overall good energy level. No nausea or vomiting. No mouth sores. No diarrhea. He has some joint "stiffness", but no joint pain. No reflux symptoms. No fever, cough or shortness of breath. No rash. No hot flashes.  Objective:  Vital signs in last 24 hours:  Blood pressure 150/70, pulse 82, temperature 98.1 F (36.7 C), temperature source Oral, resp. rate 17, height 6' (1.829 m), weight 183 lb 14.4 oz (83.416 kg), SpO2 100 %.    HEENT: No thrush or ulcers. Resp: Lungs clear bilaterally. Cardio: Regular rate and rhythm. GI: Abdomen soft and nontender. No organomegaly. Vascular: Trace lower leg edema bilaterally. Skin: No rash.    Lab Results:  Lab Results  Component Value Date   WBC 5.7 10/10/2015   HGB 13.8 10/10/2015   HCT 40.5 10/10/2015   MCV 95.3 10/10/2015   PLT 223 10/10/2015   NEUTROABS 3.9 10/10/2015    Imaging:  No results found.  Medications: I have reviewed the patient's current  medications.  Assessment/Plan: 1. Castration resistant metastatic prostate cancer with disease to the bone. His initial diagnosis was in 1995 status post prostatectomy and subsequently developed biochemical relapse treated with salvage radiation therapy. He developed lung recurrence in 2003 and underwent surgical resection and the pathology reviewed above. PSA was up to 31.5 with a doubling time of less then 6 months. CT scan of the chest on 06/11/2014 showed disease relapse with a PSA up to 31. He was started on Xtandi in December 2015 and seems to be tolerating it well. His PSA continues to be under excellent control although did show some slight rise up to 2.03 on 07/31/2015. Last imaging studies obtained in April 2016 showed response to therapy. The plan is to continue with the same dose and schedule and change to a different salvage therapy upon symptomatic progression. 2. Bone directed therapy. He has declined this in the past. 3. Pulmonary nodules, suspicious for pulmonary metastases. CT scan 11/07/2014 showed the pulmonary lesions were improved which might suggest pulmonary metastasis responding to therapy.   Disposition: Mr. Trickett appears stable. He will continue Xtandi. We will follow-up on the PSA from today. He will return for a follow-up visit and labs in 2 months. He and his wife understand to contact the office prior to that visit with any problems.    Ned Card ANP/GNP-BC   10/10/2015  2:27 PM

## 2015-10-10 NOTE — Telephone Encounter (Signed)
Gave and printed appt sched and avs fo rpt for May °

## 2015-10-11 LAB — PSA: PROSTATE SPECIFIC AG, SERUM: 2.7 ng/mL (ref 0.0–4.0)

## 2015-10-11 LAB — PSA (PARALLEL TESTING): PSA: 2.89 ng/mL (ref <=4.00)

## 2015-10-11 LAB — TESTOSTERONE: Testosterone, Serum: 129 ng/dL — ABNORMAL LOW (ref 348–1197)

## 2015-11-05 ENCOUNTER — Other Ambulatory Visit: Payer: Medicare Other

## 2015-11-05 ENCOUNTER — Ambulatory Visit: Payer: Medicare Other | Admitting: Oncology

## 2015-12-12 ENCOUNTER — Telehealth: Payer: Self-pay | Admitting: Oncology

## 2015-12-12 ENCOUNTER — Other Ambulatory Visit (HOSPITAL_BASED_OUTPATIENT_CLINIC_OR_DEPARTMENT_OTHER): Payer: Medicare Other

## 2015-12-12 ENCOUNTER — Ambulatory Visit (HOSPITAL_BASED_OUTPATIENT_CLINIC_OR_DEPARTMENT_OTHER): Payer: Medicare Other | Admitting: Oncology

## 2015-12-12 VITALS — BP 169/83 | HR 69 | Temp 97.7°F | Resp 18 | Ht 72.0 in | Wt 187.4 lb

## 2015-12-12 DIAGNOSIS — R918 Other nonspecific abnormal finding of lung field: Secondary | ICD-10-CM | POA: Diagnosis not present

## 2015-12-12 DIAGNOSIS — C7951 Secondary malignant neoplasm of bone: Secondary | ICD-10-CM

## 2015-12-12 DIAGNOSIS — C61 Malignant neoplasm of prostate: Secondary | ICD-10-CM | POA: Diagnosis not present

## 2015-12-12 LAB — CBC WITH DIFFERENTIAL/PLATELET
BASO%: 0.6 % (ref 0.0–2.0)
Basophils Absolute: 0 10*3/uL (ref 0.0–0.1)
EOS%: 2.2 % (ref 0.0–7.0)
Eosinophils Absolute: 0.1 10*3/uL (ref 0.0–0.5)
HCT: 41.2 % (ref 38.4–49.9)
HGB: 14.1 g/dL (ref 13.0–17.1)
LYMPH%: 29 % (ref 14.0–49.0)
MCH: 32.9 pg (ref 27.2–33.4)
MCHC: 34.2 g/dL (ref 32.0–36.0)
MCV: 96.3 fL (ref 79.3–98.0)
MONO#: 0.5 10*3/uL (ref 0.1–0.9)
MONO%: 10.5 % (ref 0.0–14.0)
NEUT#: 2.9 10*3/uL (ref 1.5–6.5)
NEUT%: 57.7 % (ref 39.0–75.0)
Platelets: 232 10*3/uL (ref 140–400)
RBC: 4.28 10*6/uL (ref 4.20–5.82)
RDW: 13.2 % (ref 11.0–14.6)
WBC: 5 10*3/uL (ref 4.0–10.3)
lymph#: 1.4 10*3/uL (ref 0.9–3.3)

## 2015-12-12 LAB — COMPREHENSIVE METABOLIC PANEL
ALT: 11 U/L (ref 0–55)
AST: 16 U/L (ref 5–34)
Albumin: 3.8 g/dL (ref 3.5–5.0)
Alkaline Phosphatase: 93 U/L (ref 40–150)
Anion Gap: 10 mEq/L (ref 3–11)
BUN: 13.7 mg/dL (ref 7.0–26.0)
CHLORIDE: 102 meq/L (ref 98–109)
CO2: 29 meq/L (ref 22–29)
CREATININE: 1 mg/dL (ref 0.7–1.3)
Calcium: 9.3 mg/dL (ref 8.4–10.4)
EGFR: 67 mL/min/{1.73_m2} — ABNORMAL LOW (ref >90)
Glucose: 82 mg/dl (ref 70–140)
Potassium: 4.2 mEq/L (ref 3.5–5.1)
SODIUM: 141 meq/L (ref 136–145)
Total Bilirubin: 0.47 mg/dL (ref 0.20–1.20)
Total Protein: 7.2 g/dL (ref 6.4–8.3)

## 2015-12-12 NOTE — Telephone Encounter (Signed)
Gave and printed appt sched and avs for pt for Sept °

## 2015-12-12 NOTE — Progress Notes (Signed)
Hematology and Oncology Follow Up Visit  Justin Porter [id number] 07-18-1929 82 y.o. 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 His initial diagnosis was in 1995.  Prior Therapy:  He is status post prostatectomy and subsequently developed biochemical relapse treated with salvage radiation therapy. He did develop presumed pulmonary metastasis status post surgical resection in 2003. The pathology confirmed the presence of adenocarcinoma from a prostate etiology. He has been on androgen depravation initially intermittently and subsequently continuously since 2013. He developed a rise in his PSA up to 31.56 with a doubling time less than 6 months in 05/2014 with bony metastasis.    Current therapy:   Androgen deprivation.  Xtandi started and December 2015.  Interim History:   Justin Porter presents today for a followup visit with his wife. Since the last visit, he reports doing very well without any major complaints. He continues to have improvement in his performance status and continues to ambulate without any recent falls or syncope. He is able to participate in bowling but has not able to golf at this point. He continues to be on Xtandi without any major complications. He does not report any GI toxicities including nausea, abdominal pain or dyspepsia. He does not report anybone pain. Did not report any new respiratory symptoms such as cough, hemoptysis or difficulty breathing.His appetite is reasonable and weight have been stable.  He does not report any headaches, blurry vision, syncope or seizures. He does not report any constitutional symptoms of fevers or chills. He has not reported any chest pain or palpitation. He has not reported any cough or hemoptysis.  He does not report any frequency urgency or hesitancy or hematuria. He does not report any  lymphadenopathy or petechiae. He does not report any skin rashes or lesions. He does not report any depression or side he does report hearing loss. Remainder of his review of systems unremarkable.      Medications: I have reviewed the patient's current medications.  Current Outpatient Prescriptions  Medication Sig Dispense Refill  . calcium citrate-vitamin D (CITRACAL+D) 315-200 MG-UNIT per tablet Take 1 tablet by mouth every morning.     . furosemide (LASIX) 20 MG tablet Take 20 mg by mouth 2 (two) times daily.    . Multiple Vitamin (MULITIVITAMIN WITH MINERALS) TABS Take 1 tablet by mouth every morning.    . polyethylene glycol (MIRALAX / GLYCOLAX) packet Take 17 g by mouth 2 (two) times daily. 14 each 0  . Potassium 99 MG TABS Take 1 tablet by mouth every morning.    . simvastatin (ZOCOR) 20 MG tablet Take 20 mg by mouth every evening.     Justin Porter 40 MG capsule TAKE 4 CAPSULES BY MOUTH DAILY 120 capsule 0   No current facility-administered medications for this visit.     Allergies:  Allergies  Allergen Reactions  . Codeine Nausea And Vomiting  . Shrimp [Shellfish Allergy] Nausea And Vomiting  . Betadine [Povidone Iodine] Rash    Past Medical History, Surgical history, Social history, and Family History were reviewed and updated.   Physical Exam: Blood pressure 169/83, pulse 69, temperature 97.7 F (36.5 C), temperature source Oral, resp. rate 18, height 6' (1.829 m), weight 187 lb 6.4 oz (85.004 kg), SpO2 100 %. ECOG: 1 General appearance:Alert, awake gentleman without distress. Head: Normocephalic, without obvious abnormality no oral thrush noted. Neck: no adenopathy Lymph nodes: Cervical, supraclavicular,  and axillary nodes normal. Heart:regular rate and rhythm, S1, S2 normal, no murmur, click, rub or gallop Lung:chest clear, no wheezing, rales, normal symmetric air entry  Abdomin: soft, non-tender, without masses or organomegaly no rebound or guarding. EXT: trace edema  bilateral lower extremities. Neurological examination: No deficits noted.   Lab Results: Lab Results  Component Value Date   WBC 5.0 12/12/2015   HGB 14.1 12/12/2015   HCT 41.2 12/12/2015   MCV 96.3 12/12/2015   PLT 232 12/12/2015     Chemistry      Component Value Date/Time   NA 141 12/12/2015 1306   NA 142 05/23/2014 0423   K 4.2 12/12/2015 1306   K 4.2 05/23/2014 0423   CL 102 05/23/2014 0423   CO2 29 12/12/2015 1306   CO2 26 05/23/2014 0423   BUN 13.7 12/12/2015 1306   BUN 19 05/23/2014 0423   CREATININE 1.0 12/12/2015 1306   CREATININE 0.82 05/23/2014 0423   CREATININE 0.75 10/29/2011 1545      Component Value Date/Time   CALCIUM 9.3 12/12/2015 1306   CALCIUM 8.7 05/23/2014 0423   ALKPHOS 93 12/12/2015 1306   ALKPHOS 83 12/14/2011 1044   AST 16 12/12/2015 1306   AST 21 12/14/2011 1044   ALT 11 12/12/2015 1306   ALT 25 12/14/2011 1044   BILITOT 0.47 12/12/2015 1306   BILITOT 0.4 12/14/2011 1044      Results for Justin Porter, Justin Porter (MRN [id number]) as of 12/12/2015 13:19  Ref. Range 07/31/2015 13:01 07/31/2015 13:01 10/10/2015 13:52  PSA Latest Ref Range: <=4.00 ng/mL 2.03 2.0 2.89             82 year old gentleman with the following issues:   1. Castration resistant metastatic prostate cancer with disease to the bone. His initial diagnosis was in 1995 status post prostatectomy and subsequently developed biochemical relapse treated with salvage radiation therapy.  He developed lung recurrence in 2003 and underwent surgical resection and the pathology reviewed above. PSA was up to 31.5 with a doubling time of less then 6 months. CT scan of the chest on 06/11/2014 showed disease relapse with a PSA up to 31.   He was started on Xtandi in December 2015 and have tolerated it well. His PSA continues to be Porter without any sign or symptoms of symptomatic relapse disease. His last PSA was a 2.7 and plan on keeping him on the same dose and schedule.  2. Androgen  depravation: Have recommended he continues Lupron which he has been receiving every 6 months. His testosterone level remains very Porter at 129.  3. Bone directed therapy: He has declined that in the past but we will continue to address it with him in the future.  4. Pulmonary nodules: This could be suspicious for pulmonary metastasis. CT scan on 11/07/2014  showed his pulmonary lesions are actually improving which might suggest pulmonary metastasis that are responding to therapy. Imaging studies will be repeated if his PSA starts to rise rapidly.  5. Follow-up: Will be in 3-4 months.   Zola Button, MD 5/25/20171:43 PM

## 2015-12-13 LAB — PSA: Prostate Specific Ag, Serum: 4.8 ng/mL — ABNORMAL HIGH (ref 0.0–4.0)

## 2015-12-17 ENCOUNTER — Other Ambulatory Visit: Payer: Self-pay | Admitting: Oncology

## 2015-12-23 DIAGNOSIS — G3184 Mild cognitive impairment, so stated: Secondary | ICD-10-CM | POA: Diagnosis not present

## 2016-01-24 DIAGNOSIS — G3184 Mild cognitive impairment, so stated: Secondary | ICD-10-CM | POA: Diagnosis not present

## 2016-01-24 DIAGNOSIS — F0390 Unspecified dementia without behavioral disturbance: Secondary | ICD-10-CM | POA: Diagnosis not present

## 2016-02-03 ENCOUNTER — Other Ambulatory Visit: Payer: Self-pay | Admitting: Oncology

## 2016-02-06 DIAGNOSIS — H26492 Other secondary cataract, left eye: Secondary | ICD-10-CM | POA: Diagnosis not present

## 2016-02-06 DIAGNOSIS — H532 Diplopia: Secondary | ICD-10-CM | POA: Diagnosis not present

## 2016-02-06 DIAGNOSIS — H1852 Epithelial (juvenile) corneal dystrophy: Secondary | ICD-10-CM | POA: Diagnosis not present

## 2016-02-06 DIAGNOSIS — H04123 Dry eye syndrome of bilateral lacrimal glands: Secondary | ICD-10-CM | POA: Diagnosis not present

## 2016-03-18 DIAGNOSIS — I872 Venous insufficiency (chronic) (peripheral): Secondary | ICD-10-CM | POA: Diagnosis not present

## 2016-03-18 DIAGNOSIS — I10 Essential (primary) hypertension: Secondary | ICD-10-CM | POA: Diagnosis not present

## 2016-03-18 DIAGNOSIS — R7309 Other abnormal glucose: Secondary | ICD-10-CM | POA: Diagnosis not present

## 2016-03-18 DIAGNOSIS — E785 Hyperlipidemia, unspecified: Secondary | ICD-10-CM | POA: Diagnosis not present

## 2016-03-18 DIAGNOSIS — Z0000 Encounter for general adult medical examination without abnormal findings: Secondary | ICD-10-CM | POA: Diagnosis not present

## 2016-03-18 DIAGNOSIS — M109 Gout, unspecified: Secondary | ICD-10-CM | POA: Diagnosis not present

## 2016-03-18 DIAGNOSIS — C61 Malignant neoplasm of prostate: Secondary | ICD-10-CM | POA: Diagnosis not present

## 2016-03-18 DIAGNOSIS — Z1389 Encounter for screening for other disorder: Secondary | ICD-10-CM | POA: Diagnosis not present

## 2016-03-18 DIAGNOSIS — M159 Polyosteoarthritis, unspecified: Secondary | ICD-10-CM | POA: Diagnosis not present

## 2016-03-30 ENCOUNTER — Other Ambulatory Visit: Payer: Self-pay | Admitting: Oncology

## 2016-04-16 ENCOUNTER — Ambulatory Visit (HOSPITAL_BASED_OUTPATIENT_CLINIC_OR_DEPARTMENT_OTHER): Payer: Medicare Other | Admitting: Oncology

## 2016-04-16 ENCOUNTER — Telehealth: Payer: Self-pay | Admitting: Oncology

## 2016-04-16 ENCOUNTER — Other Ambulatory Visit (HOSPITAL_BASED_OUTPATIENT_CLINIC_OR_DEPARTMENT_OTHER): Payer: Medicare Other

## 2016-04-16 VITALS — BP 159/73 | HR 68 | Temp 97.7°F | Resp 18 | Ht 72.0 in | Wt 189.7 lb

## 2016-04-16 DIAGNOSIS — E291 Testicular hypofunction: Secondary | ICD-10-CM | POA: Diagnosis not present

## 2016-04-16 DIAGNOSIS — R911 Solitary pulmonary nodule: Secondary | ICD-10-CM | POA: Diagnosis not present

## 2016-04-16 DIAGNOSIS — Z23 Encounter for immunization: Secondary | ICD-10-CM

## 2016-04-16 DIAGNOSIS — Z8546 Personal history of malignant neoplasm of prostate: Secondary | ICD-10-CM

## 2016-04-16 DIAGNOSIS — C61 Malignant neoplasm of prostate: Secondary | ICD-10-CM | POA: Diagnosis not present

## 2016-04-16 DIAGNOSIS — C7951 Secondary malignant neoplasm of bone: Secondary | ICD-10-CM | POA: Diagnosis not present

## 2016-04-16 LAB — CBC WITH DIFFERENTIAL/PLATELET
BASO%: 0.5 % (ref 0.0–2.0)
Basophils Absolute: 0 10*3/uL (ref 0.0–0.1)
EOS ABS: 0.1 10*3/uL (ref 0.0–0.5)
EOS%: 2.5 % (ref 0.0–7.0)
HCT: 38.7 % (ref 38.4–49.9)
HGB: 13.3 g/dL (ref 13.0–17.1)
LYMPH%: 27.6 % (ref 14.0–49.0)
MCH: 32.9 pg (ref 27.2–33.4)
MCHC: 34.4 g/dL (ref 32.0–36.0)
MCV: 95.8 fL (ref 79.3–98.0)
MONO#: 0.5 10*3/uL (ref 0.1–0.9)
MONO%: 8.9 % (ref 0.0–14.0)
NEUT#: 3.3 10*3/uL (ref 1.5–6.5)
NEUT%: 60.5 % (ref 39.0–75.0)
Platelets: 224 10*3/uL (ref 140–400)
RBC: 4.04 10*6/uL — AB (ref 4.20–5.82)
RDW: 13.1 % (ref 11.0–14.6)
WBC: 5.5 10*3/uL (ref 4.0–10.3)
lymph#: 1.5 10*3/uL (ref 0.9–3.3)

## 2016-04-16 LAB — COMPREHENSIVE METABOLIC PANEL
ALBUMIN: 3.8 g/dL (ref 3.5–5.0)
ALK PHOS: 111 U/L (ref 40–150)
ALT: 12 U/L (ref 0–55)
AST: 18 U/L (ref 5–34)
Anion Gap: 10 mEq/L (ref 3–11)
BUN: 16 mg/dL (ref 7.0–26.0)
CHLORIDE: 100 meq/L (ref 98–109)
CO2: 28 meq/L (ref 22–29)
Calcium: 9.3 mg/dL (ref 8.4–10.4)
Creatinine: 1 mg/dL (ref 0.7–1.3)
EGFR: 68 mL/min/{1.73_m2} — AB (ref >90)
GLUCOSE: 92 mg/dL (ref 70–140)
POTASSIUM: 4.2 meq/L (ref 3.5–5.1)
SODIUM: 139 meq/L (ref 136–145)
Total Bilirubin: 0.55 mg/dL (ref 0.20–1.20)
Total Protein: 7.1 g/dL (ref 6.4–8.3)

## 2016-04-16 MED ORDER — INFLUENZA VAC SPLIT QUAD 0.5 ML IM SUSY
0.5000 mL | PREFILLED_SYRINGE | Freq: Once | INTRAMUSCULAR | Status: AC
  Administered 2016-04-16: 0.5 mL via INTRAMUSCULAR
  Filled 2016-04-16: qty 0.5

## 2016-04-16 NOTE — Progress Notes (Signed)
Hematology and Oncology Follow Up Visit  RAHEIM BEUTLER [id number] [birth year] 82 y.o. 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 His initial diagnosis was in 1995.  Prior Therapy:  He is status post prostatectomy and subsequently developed biochemical relapse treated with salvage radiation therapy. He did develop presumed pulmonary metastasis status post surgical resection in 2003. The pathology confirmed the presence of adenocarcinoma from a prostate etiology. He has been on androgen depravation initially intermittently and subsequently continuously since 2013. He developed a rise in his PSA up to 31.56 with a doubling time less than 6 months in 05/2014 with bony metastasis.    Current therapy:   Androgen deprivation.  Xtandi started and December 2015.  Interim History:   Mr. Rogacki presents today for a followup visit with his wife. Since the last visit, he continues to be in reasonable health without any major changes. He continues to be on Xtandi without any major complications. He does not report any GI toxicities including nausea, abdominal pain or dyspepsia. He does not report anybone pain. Did not report any new respiratory symptoms such as cough, hemoptysis or difficulty breathing.His appetite is reasonable and weight have been stable.  He continues to enjoy a reasonable quality of life and is involved in an exercise program. He does also play bowling and has been able to do so without any further decline.  He does not report any headaches, blurry vision, syncope or seizures. He does not report any constitutional symptoms of fevers or chills. He has not reported any chest pain or palpitation. He has not reported any cough or hemoptysis.  He does not report any frequency urgency or hesitancy or hematuria. He does not report any lymphadenopathy or  petechiae. He does not report any skin rashes or lesions. He does not report any depression or side he does report hearing loss. Remainder of his review of systems unremarkable.      Medications: I have reviewed the patient's current medications.  Current Outpatient Prescriptions  Medication Sig Dispense Refill  . calcium citrate-vitamin D (CITRACAL+D) 315-200 MG-UNIT per tablet Take 1 tablet by mouth every morning.     . furosemide (LASIX) 20 MG tablet Take 20 mg by mouth 2 (two) times daily.    . Multiple Vitamin (MULITIVITAMIN WITH MINERALS) TABS Take 1 tablet by mouth every morning.    . polyethylene glycol (MIRALAX / GLYCOLAX) packet Take 17 g by mouth 2 (two) times daily. 14 each 0  . Potassium 99 MG TABS Take 1 tablet by mouth every morning.    . simvastatin (ZOCOR) 20 MG tablet Take 20 mg by mouth every evening.     [name] 40 MG capsule TAKE 4 CAPSULES (160 MG TOTAL) BY MOUTH ONCE DAILY AT THE SAME TIME. MAY TAKE WITH OR WITHOUT FOOD. SWALLOW WHOLE. 120 capsule 0   Current Facility-Administered Medications  Medication Dose Route Frequency Provider Last Rate Last Dose  . Influenza vac split quadrivalent PF (FLUARIX) injection 0.5 mL  0.5 mL Intramuscular Once Wyatt Portela, MD         Allergies:  Allergies  Allergen Reactions  . Codeine Nausea And Vomiting  . Shrimp [Shellfish Allergy] Nausea And Vomiting  . Betadine [Povidone Iodine] Rash    Past Medical History, Surgical history, Social history, and Family History were reviewed and updated.   Physical Exam: Blood pressure (!) 159/73, pulse 68, temperature 97.7  F (36.5 C), temperature source Oral, resp. rate 18, height 6' (1.829 m), weight 189 lb 11.2 oz (86 kg), SpO2 99 %. ECOG: 1 General appearance:Well-appearing gentleman without distress. He is hard of hearing. Head: Normocephalic, without obvious abnormality no oral thrush. Neck: no adenopathy Lymph nodes: Cervical, supraclavicular, and axillary nodes  normal. Heart:regular rate and rhythm, S1, S2 normal, no murmur, click, rub or gallop Lung:chest clear, no wheezing, rales, normal symmetric air entry  Abdomin: soft, non-tender, without masses or organomegaly no shifting dullness or ascites. EXT: trace edema bilateral lower extremities. Neurological examination: No deficits noted.   Lab Results: Lab Results  Component Value Date   WBC 5.5 04/16/2016   HGB 13.3 04/16/2016   HCT 38.7 04/16/2016   MCV 95.8 04/16/2016   PLT 224 04/16/2016     Chemistry      Component Value Date/Time   NA 141 12/12/2015 1306   K 4.2 12/12/2015 1306   CL 102 05/23/2014 0423   CO2 29 12/12/2015 1306   BUN 13.7 12/12/2015 1306   CREATININE 1.0 12/12/2015 1306      Component Value Date/Time   CALCIUM 9.3 12/12/2015 1306   ALKPHOS 93 12/12/2015 1306   AST 16 12/12/2015 1306   ALT 11 12/12/2015 1306   BILITOT 0.47 12/12/2015 1306        Results for DUSTEN, ELLINWOOD (MRN [id number]) as of 04/16/2016 13:05  Ref. Range 10/10/2015 13:52 10/10/2015 13:52 12/12/2015 13:06  PSA Latest Ref Range: 0.0 - 4.0 ng/mL 2.89 2.7 4.8 (H)            82 year old gentleman with the following issues:   1. Castration resistant metastatic prostate cancer with disease to the bone. His initial diagnosis was in 1995 status post prostatectomy and subsequently developed biochemical relapse treated with salvage radiation therapy.  He developed lung recurrence in 2003 and underwent surgical resection and the pathology reviewed above. PSA was up to 31.5 with a doubling time of less then 6 months. CT scan of the chest on 06/11/2014 showed disease relapse with a PSA up to 31.   He was started on Xtandi in December 2015 and have tolerated it well. His PSA remained reasonably under control although it is rising slowly. The plan is to continue the same dose and schedule and use different salvage regimen upon symptomatic progression.  2. Androgen depravation: Have  recommended he continues Lupron which he has been receiving every 6 months. We'll continue to monitor testosterone in the future.  3. Bone directed therapy: He has declined that in the past but we will continue to address it with him in the future.  4. Pulmonary nodules: This could be suspicious for pulmonary metastasis. CT scan on 11/07/2014  showed his pulmonary lesions are actually improving which might suggest pulmonary metastasis that are responding to therapy. Imaging studies will be repeated if his PSA starts to rise rapidly.  5. Follow-up: Will be in 4 months.   Zola Button, MD 9/28/20171:17 PM

## 2016-04-16 NOTE — Telephone Encounter (Signed)
Avs report and appointment schedule given to patient per 04/16/16 los.

## 2016-04-17 DIAGNOSIS — Z1211 Encounter for screening for malignant neoplasm of colon: Secondary | ICD-10-CM | POA: Diagnosis not present

## 2016-04-17 DIAGNOSIS — Z0000 Encounter for general adult medical examination without abnormal findings: Secondary | ICD-10-CM | POA: Diagnosis not present

## 2016-04-17 LAB — PSA: Prostate Specific Ag, Serum: 14.1 ng/mL — ABNORMAL HIGH (ref 0.0–4.0)

## 2016-05-18 ENCOUNTER — Other Ambulatory Visit: Payer: Self-pay | Admitting: Oncology

## 2016-05-22 DIAGNOSIS — Z1211 Encounter for screening for malignant neoplasm of colon: Secondary | ICD-10-CM | POA: Diagnosis not present

## 2016-05-28 DIAGNOSIS — L723 Sebaceous cyst: Secondary | ICD-10-CM | POA: Diagnosis not present

## 2016-05-28 DIAGNOSIS — Z85828 Personal history of other malignant neoplasm of skin: Secondary | ICD-10-CM | POA: Diagnosis not present

## 2016-05-28 DIAGNOSIS — D692 Other nonthrombocytopenic purpura: Secondary | ICD-10-CM | POA: Diagnosis not present

## 2016-05-28 DIAGNOSIS — L821 Other seborrheic keratosis: Secondary | ICD-10-CM | POA: Diagnosis not present

## 2016-05-28 DIAGNOSIS — L814 Other melanin hyperpigmentation: Secondary | ICD-10-CM | POA: Diagnosis not present

## 2016-05-28 DIAGNOSIS — D225 Melanocytic nevi of trunk: Secondary | ICD-10-CM | POA: Diagnosis not present

## 2016-05-28 DIAGNOSIS — D2271 Melanocytic nevi of right lower limb, including hip: Secondary | ICD-10-CM | POA: Diagnosis not present

## 2016-05-28 DIAGNOSIS — D1801 Hemangioma of skin and subcutaneous tissue: Secondary | ICD-10-CM | POA: Diagnosis not present

## 2016-06-24 ENCOUNTER — Encounter: Payer: Self-pay | Admitting: *Deleted

## 2016-06-25 ENCOUNTER — Encounter: Payer: Self-pay | Admitting: Oncology

## 2016-06-25 NOTE — Progress Notes (Signed)
Contacted pt concerning re enrolling in the [employer] for 2018.  I completed the application, dr and pt signed it so I faxed it today for processing.

## 2016-07-03 ENCOUNTER — Encounter: Payer: Self-pay | Admitting: Oncology

## 2016-07-03 NOTE — Progress Notes (Signed)
Pt is approved w/ [employer] Patient Assistance program for Xtandi from 07/03/16 to 07/19/17.

## 2016-07-21 ENCOUNTER — Other Ambulatory Visit: Payer: Self-pay | Admitting: Oncology

## 2016-08-27 ENCOUNTER — Telehealth: Payer: Self-pay | Admitting: Oncology

## 2016-08-27 ENCOUNTER — Other Ambulatory Visit (HOSPITAL_BASED_OUTPATIENT_CLINIC_OR_DEPARTMENT_OTHER): Payer: Medicare Other

## 2016-08-27 ENCOUNTER — Ambulatory Visit (HOSPITAL_BASED_OUTPATIENT_CLINIC_OR_DEPARTMENT_OTHER): Payer: Medicare Other | Admitting: Oncology

## 2016-08-27 VITALS — BP 168/79 | HR 81 | Temp 98.0°F | Resp 17 | Ht 72.0 in | Wt 188.2 lb

## 2016-08-27 DIAGNOSIS — C61 Malignant neoplasm of prostate: Secondary | ICD-10-CM

## 2016-08-27 DIAGNOSIS — Z8546 Personal history of malignant neoplasm of prostate: Secondary | ICD-10-CM | POA: Diagnosis not present

## 2016-08-27 DIAGNOSIS — H04123 Dry eye syndrome of bilateral lacrimal glands: Secondary | ICD-10-CM | POA: Diagnosis not present

## 2016-08-27 DIAGNOSIS — H35033 Hypertensive retinopathy, bilateral: Secondary | ICD-10-CM | POA: Diagnosis not present

## 2016-08-27 DIAGNOSIS — Z23 Encounter for immunization: Secondary | ICD-10-CM

## 2016-08-27 DIAGNOSIS — H26492 Other secondary cataract, left eye: Secondary | ICD-10-CM | POA: Diagnosis not present

## 2016-08-27 DIAGNOSIS — Z961 Presence of intraocular lens: Secondary | ICD-10-CM | POA: Diagnosis not present

## 2016-08-27 DIAGNOSIS — C7951 Secondary malignant neoplasm of bone: Secondary | ICD-10-CM

## 2016-08-27 DIAGNOSIS — H35372 Puckering of macula, left eye: Secondary | ICD-10-CM | POA: Diagnosis not present

## 2016-08-27 LAB — CBC WITH DIFFERENTIAL/PLATELET
BASO%: 1 % (ref 0.0–2.0)
Basophils Absolute: 0.1 10*3/uL (ref 0.0–0.1)
EOS%: 2.6 % (ref 0.0–7.0)
Eosinophils Absolute: 0.1 10*3/uL (ref 0.0–0.5)
HCT: 39.7 % (ref 38.4–49.9)
HEMOGLOBIN: 13.4 g/dL (ref 13.0–17.1)
LYMPH#: 1.4 10*3/uL (ref 0.9–3.3)
LYMPH%: 26.3 % (ref 14.0–49.0)
MCH: 32.5 pg (ref 27.2–33.4)
MCHC: 33.8 g/dL (ref 32.0–36.0)
MCV: 96.2 fL (ref 79.3–98.0)
MONO#: 0.5 10*3/uL (ref 0.1–0.9)
MONO%: 10 % (ref 0.0–14.0)
NEUT%: 60.1 % (ref 39.0–75.0)
NEUTROS ABS: 3.3 10*3/uL (ref 1.5–6.5)
PLATELETS: 239 10*3/uL (ref 140–400)
RBC: 4.13 10*6/uL — AB (ref 4.20–5.82)
RDW: 13 % (ref 11.0–14.6)
WBC: 5.4 10*3/uL (ref 4.0–10.3)

## 2016-08-27 LAB — COMPREHENSIVE METABOLIC PANEL
ALT: 11 U/L (ref 0–55)
AST: 19 U/L (ref 5–34)
Albumin: 3.9 g/dL (ref 3.5–5.0)
Alkaline Phosphatase: 167 U/L — ABNORMAL HIGH (ref 40–150)
Anion Gap: 9 mEq/L (ref 3–11)
BILIRUBIN TOTAL: 0.55 mg/dL (ref 0.20–1.20)
BUN: 14.3 mg/dL (ref 7.0–26.0)
CALCIUM: 9.6 mg/dL (ref 8.4–10.4)
CHLORIDE: 100 meq/L (ref 98–109)
CO2: 31 mEq/L — ABNORMAL HIGH (ref 22–29)
CREATININE: 0.9 mg/dL (ref 0.7–1.3)
EGFR: 76 mL/min/{1.73_m2} — AB (ref >90)
Glucose: 93 mg/dl (ref 70–140)
Potassium: 4.3 mEq/L (ref 3.5–5.1)
Sodium: 139 mEq/L (ref 136–145)
TOTAL PROTEIN: 7.2 g/dL (ref 6.4–8.3)

## 2016-08-27 NOTE — Telephone Encounter (Signed)
Appointments scheduled per 08/27/16 los. Patient was given a copy of the AVS report and appointment schedule per 08/27/16 los.

## 2016-08-27 NOTE — Progress Notes (Signed)
Hematology and Oncology Follow Up Visit  Justin Porter [id number] 1928/11/23 83 y.o. 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 His initial diagnosis was in 1995.  Prior Therapy:  He is status post prostatectomy and subsequently developed biochemical relapse treated with salvage radiation therapy. He did develop presumed pulmonary metastasis status post surgical resection in 2003. The pathology confirmed the presence of adenocarcinoma from a prostate etiology. He has been on androgen depravation initially intermittently and subsequently continuously since 2013. He developed a rise in his PSA up to 31.56 with a doubling time less than 6 months in 05/2014 with bony metastasis.    Current therapy:   Androgen deprivation.  Xtandi started and December 2015.  Interim History:   Justin Porter presents today for a followup visit with his wife. Since the last visit, he continues to be in reasonable health without any major changes. He continues to be on Xtandi without any major complications. He does not report any GI toxicities including nausea, abdominal pain or dyspepsia. He does not report anybone pain. Did not report any new respiratory symptoms such as cough, hemoptysis or difficulty breathing.His appetite is reasonable and weight have been stable.  He continues to enjoy a reasonable quality of life and is involved in an exercise program. He does also play bowling and has been able to do so without any further decline.  He does not report any headaches, blurry vision, syncope or seizures. He does not report any constitutional symptoms of fevers or chills. He has not reported any chest pain or palpitation. He has not reported any cough or hemoptysis.  He does not report any frequency urgency or hesitancy or hematuria. He does not report any lymphadenopathy or  petechiae. He does not report any skin rashes or lesions. He does not report any depression or side he does report hearing loss. Remainder of his review of systems unremarkable.      Medications: I have reviewed the patient's current medications.  Current Outpatient Prescriptions  Medication Sig Dispense Refill  . calcium citrate-vitamin D (CITRACAL+D) 315-200 MG-UNIT per tablet Take 1 tablet by mouth every morning.     . furosemide (LASIX) 20 MG tablet Take 20 mg by mouth 2 (two) times daily.    . Multiple Vitamin (MULITIVITAMIN WITH MINERALS) TABS Take 1 tablet by mouth every morning.    . polyethylene glycol (MIRALAX / GLYCOLAX) packet Take 17 g by mouth 2 (two) times daily. 14 each 0  . Potassium 99 MG TABS Take 1 tablet by mouth every morning.    . simvastatin (ZOCOR) 20 MG tablet Take 20 mg by mouth every evening.     Justin Porter 40 MG capsule TAKE 4 CAPSULES (160 MG TOTAL) BY MOUTH ONCE DAILY AT THE SAME TIME 120 capsule 0   No current facility-administered medications for this visit.      Allergies:  Allergies  Allergen Reactions  . Codeine Nausea And Vomiting  . Shrimp [Shellfish Allergy] Nausea And Vomiting  . Betadine [Povidone Iodine] Rash    Past Medical History, Surgical history, Social history, and Family History were reviewed and updated.   Physical Exam: Blood pressure (!) 168/79, pulse 81, temperature 98 F (36.7 C), temperature source Oral, resp. rate 17, height 6' (1.829 m), weight 188 lb 3.2 oz (85.4 kg), SpO2 99 %. ECOG: 1 General appearance:Well-appearing gentleman without distress. He is hard of hearing. Head: Normocephalic,  without obvious abnormality no oral thrush. Neck: no adenopathy Lymph nodes: Cervical, supraclavicular, and axillary nodes normal. Heart:regular rate and rhythm, S1, S2 normal, no murmur, click, rub or gallop Lung:chest clear, no wheezing, rales, normal symmetric air entry  Abdomin: soft, non-tender, without masses or organomegaly no  shifting dullness or ascites. EXT: trace edema bilateral lower extremities. Neurological examination: No deficits noted.   Lab Results: Lab Results  Component Value Date   WBC 5.4 08/27/2016   HGB 13.4 08/27/2016   HCT 39.7 08/27/2016   MCV 96.2 08/27/2016   PLT 239 08/27/2016     Chemistry      Component Value Date/Time   NA 139 04/16/2016 1252   K 4.2 04/16/2016 1252   CL 102 05/23/2014 0423   CO2 28 04/16/2016 1252   BUN 16.0 04/16/2016 1252   CREATININE 1.0 04/16/2016 1252      Component Value Date/Time   CALCIUM 9.3 04/16/2016 1252   ALKPHOS 111 04/16/2016 1252   AST 18 04/16/2016 1252   ALT 12 04/16/2016 1252   BILITOT 0.55 04/16/2016 1252        Results for Justin Porter (MRN [id number]) as of 04/16/2016 13:05  Ref. Range 10/10/2015 13:52 10/10/2015 13:52 12/12/2015 13:06  PSA Latest Ref Range: 0.0 - 4.0 ng/mL 2.89 2.7 4.8 (H)       Results for Justin Porter (MRN [id number]) as of 08/27/2016 13:25  Ref. Range 10/10/2015 13:52 12/12/2015 13:06 04/16/2016 12:52  PSA Latest Ref Range: 0.0 - 4.0 ng/mL 2.7 4.8 (H) 14.1 (H)       83 year old gentleman with the following issues:   1. Castration resistant metastatic prostate cancer with disease to the bone. His initial diagnosis was in 1995 status post prostatectomy and subsequently developed biochemical relapse treated with salvage radiation therapy.  He developed lung recurrence in 2003 and underwent surgical resection and the pathology reviewed above. PSA was up to 31.5 with a doubling time of less then 6 months. CT scan of the chest on 06/11/2014 showed disease relapse with a PSA up to 31.   He was started on Xtandi in December 2015 and have tolerated it well. His PSA continues to rise in the last 6 months but he remains asymptomatic. The plan is to repeat staging workup before the next visit and consider different salvage therapy if he continues to have a rise in his PSA and have progressed on imaging  studies.  2. Androgen depravation: Have recommended he continues Lupron which he has been receiving every 6 months. We'll repeat his PSA to insure adequate castration.  3. Bone directed therapy: This will be addressed he developed progression of disease in the bone.  4. Pulmonary nodules: This could be suspicious for pulmonary metastasis. CT scan on 11/07/2014  showed his pulmonary lesions that has responded at this time. We will repeat imaging studies in May 2018 for follow-up purposes.  5. Follow-up: Will be in 3 months.   Zola Button, MD 2/8/20181:33 PM

## 2016-08-28 LAB — PSA: PROSTATE SPECIFIC AG, SERUM: 39.5 ng/mL — AB (ref 0.0–4.0)

## 2016-08-31 DIAGNOSIS — G3184 Mild cognitive impairment, so stated: Secondary | ICD-10-CM | POA: Diagnosis not present

## 2016-09-16 DIAGNOSIS — M542 Cervicalgia: Secondary | ICD-10-CM | POA: Diagnosis not present

## 2016-09-16 DIAGNOSIS — E785 Hyperlipidemia, unspecified: Secondary | ICD-10-CM | POA: Diagnosis not present

## 2016-09-16 DIAGNOSIS — I10 Essential (primary) hypertension: Secondary | ICD-10-CM | POA: Diagnosis not present

## 2016-09-16 DIAGNOSIS — I872 Venous insufficiency (chronic) (peripheral): Secondary | ICD-10-CM | POA: Diagnosis not present

## 2016-09-16 DIAGNOSIS — C61 Malignant neoplasm of prostate: Secondary | ICD-10-CM | POA: Diagnosis not present

## 2016-09-21 ENCOUNTER — Other Ambulatory Visit: Payer: Self-pay | Admitting: Oncology

## 2016-09-21 DIAGNOSIS — M9901 Segmental and somatic dysfunction of cervical region: Secondary | ICD-10-CM | POA: Diagnosis not present

## 2016-09-21 DIAGNOSIS — M9902 Segmental and somatic dysfunction of thoracic region: Secondary | ICD-10-CM | POA: Diagnosis not present

## 2016-09-21 DIAGNOSIS — M5136 Other intervertebral disc degeneration, lumbar region: Secondary | ICD-10-CM | POA: Diagnosis not present

## 2016-09-21 DIAGNOSIS — M50323 Other cervical disc degeneration at C6-C7 level: Secondary | ICD-10-CM | POA: Diagnosis not present

## 2016-09-21 DIAGNOSIS — M5413 Radiculopathy, cervicothoracic region: Secondary | ICD-10-CM | POA: Diagnosis not present

## 2016-09-21 DIAGNOSIS — M546 Pain in thoracic spine: Secondary | ICD-10-CM | POA: Diagnosis not present

## 2016-09-22 DIAGNOSIS — M9902 Segmental and somatic dysfunction of thoracic region: Secondary | ICD-10-CM | POA: Diagnosis not present

## 2016-09-22 DIAGNOSIS — M50323 Other cervical disc degeneration at C6-C7 level: Secondary | ICD-10-CM | POA: Diagnosis not present

## 2016-09-22 DIAGNOSIS — M5413 Radiculopathy, cervicothoracic region: Secondary | ICD-10-CM | POA: Diagnosis not present

## 2016-09-22 DIAGNOSIS — M5136 Other intervertebral disc degeneration, lumbar region: Secondary | ICD-10-CM | POA: Diagnosis not present

## 2016-09-22 DIAGNOSIS — M9901 Segmental and somatic dysfunction of cervical region: Secondary | ICD-10-CM | POA: Diagnosis not present

## 2016-09-22 DIAGNOSIS — M546 Pain in thoracic spine: Secondary | ICD-10-CM | POA: Diagnosis not present

## 2016-09-23 DIAGNOSIS — M50323 Other cervical disc degeneration at C6-C7 level: Secondary | ICD-10-CM | POA: Diagnosis not present

## 2016-09-23 DIAGNOSIS — M9901 Segmental and somatic dysfunction of cervical region: Secondary | ICD-10-CM | POA: Diagnosis not present

## 2016-09-23 DIAGNOSIS — M9902 Segmental and somatic dysfunction of thoracic region: Secondary | ICD-10-CM | POA: Diagnosis not present

## 2016-09-23 DIAGNOSIS — M5413 Radiculopathy, cervicothoracic region: Secondary | ICD-10-CM | POA: Diagnosis not present

## 2016-09-23 DIAGNOSIS — M5136 Other intervertebral disc degeneration, lumbar region: Secondary | ICD-10-CM | POA: Diagnosis not present

## 2016-09-23 DIAGNOSIS — M546 Pain in thoracic spine: Secondary | ICD-10-CM | POA: Diagnosis not present

## 2016-09-24 DIAGNOSIS — M5413 Radiculopathy, cervicothoracic region: Secondary | ICD-10-CM | POA: Diagnosis not present

## 2016-09-24 DIAGNOSIS — M9901 Segmental and somatic dysfunction of cervical region: Secondary | ICD-10-CM | POA: Diagnosis not present

## 2016-09-24 DIAGNOSIS — M50323 Other cervical disc degeneration at C6-C7 level: Secondary | ICD-10-CM | POA: Diagnosis not present

## 2016-09-24 DIAGNOSIS — M546 Pain in thoracic spine: Secondary | ICD-10-CM | POA: Diagnosis not present

## 2016-09-24 DIAGNOSIS — M9902 Segmental and somatic dysfunction of thoracic region: Secondary | ICD-10-CM | POA: Diagnosis not present

## 2016-09-24 DIAGNOSIS — M5136 Other intervertebral disc degeneration, lumbar region: Secondary | ICD-10-CM | POA: Diagnosis not present

## 2016-09-28 DIAGNOSIS — M50323 Other cervical disc degeneration at C6-C7 level: Secondary | ICD-10-CM | POA: Diagnosis not present

## 2016-09-28 DIAGNOSIS — M5413 Radiculopathy, cervicothoracic region: Secondary | ICD-10-CM | POA: Diagnosis not present

## 2016-09-28 DIAGNOSIS — M9901 Segmental and somatic dysfunction of cervical region: Secondary | ICD-10-CM | POA: Diagnosis not present

## 2016-09-28 DIAGNOSIS — M5136 Other intervertebral disc degeneration, lumbar region: Secondary | ICD-10-CM | POA: Diagnosis not present

## 2016-09-28 DIAGNOSIS — M9902 Segmental and somatic dysfunction of thoracic region: Secondary | ICD-10-CM | POA: Diagnosis not present

## 2016-09-28 DIAGNOSIS — M546 Pain in thoracic spine: Secondary | ICD-10-CM | POA: Diagnosis not present

## 2016-09-30 DIAGNOSIS — M5136 Other intervertebral disc degeneration, lumbar region: Secondary | ICD-10-CM | POA: Diagnosis not present

## 2016-09-30 DIAGNOSIS — M5413 Radiculopathy, cervicothoracic region: Secondary | ICD-10-CM | POA: Diagnosis not present

## 2016-09-30 DIAGNOSIS — M546 Pain in thoracic spine: Secondary | ICD-10-CM | POA: Diagnosis not present

## 2016-09-30 DIAGNOSIS — M9902 Segmental and somatic dysfunction of thoracic region: Secondary | ICD-10-CM | POA: Diagnosis not present

## 2016-09-30 DIAGNOSIS — M9901 Segmental and somatic dysfunction of cervical region: Secondary | ICD-10-CM | POA: Diagnosis not present

## 2016-09-30 DIAGNOSIS — M50323 Other cervical disc degeneration at C6-C7 level: Secondary | ICD-10-CM | POA: Diagnosis not present

## 2016-10-02 DIAGNOSIS — M546 Pain in thoracic spine: Secondary | ICD-10-CM | POA: Diagnosis not present

## 2016-10-02 DIAGNOSIS — M5136 Other intervertebral disc degeneration, lumbar region: Secondary | ICD-10-CM | POA: Diagnosis not present

## 2016-10-02 DIAGNOSIS — M50323 Other cervical disc degeneration at C6-C7 level: Secondary | ICD-10-CM | POA: Diagnosis not present

## 2016-10-02 DIAGNOSIS — M5413 Radiculopathy, cervicothoracic region: Secondary | ICD-10-CM | POA: Diagnosis not present

## 2016-10-02 DIAGNOSIS — M9901 Segmental and somatic dysfunction of cervical region: Secondary | ICD-10-CM | POA: Diagnosis not present

## 2016-10-02 DIAGNOSIS — M9902 Segmental and somatic dysfunction of thoracic region: Secondary | ICD-10-CM | POA: Diagnosis not present

## 2016-10-05 DIAGNOSIS — M5413 Radiculopathy, cervicothoracic region: Secondary | ICD-10-CM | POA: Diagnosis not present

## 2016-10-05 DIAGNOSIS — M5136 Other intervertebral disc degeneration, lumbar region: Secondary | ICD-10-CM | POA: Diagnosis not present

## 2016-10-05 DIAGNOSIS — M50323 Other cervical disc degeneration at C6-C7 level: Secondary | ICD-10-CM | POA: Diagnosis not present

## 2016-10-05 DIAGNOSIS — M9901 Segmental and somatic dysfunction of cervical region: Secondary | ICD-10-CM | POA: Diagnosis not present

## 2016-10-05 DIAGNOSIS — M9902 Segmental and somatic dysfunction of thoracic region: Secondary | ICD-10-CM | POA: Diagnosis not present

## 2016-10-05 DIAGNOSIS — M546 Pain in thoracic spine: Secondary | ICD-10-CM | POA: Diagnosis not present

## 2016-10-07 DIAGNOSIS — M546 Pain in thoracic spine: Secondary | ICD-10-CM | POA: Diagnosis not present

## 2016-10-07 DIAGNOSIS — M50323 Other cervical disc degeneration at C6-C7 level: Secondary | ICD-10-CM | POA: Diagnosis not present

## 2016-10-07 DIAGNOSIS — M9901 Segmental and somatic dysfunction of cervical region: Secondary | ICD-10-CM | POA: Diagnosis not present

## 2016-10-07 DIAGNOSIS — M5136 Other intervertebral disc degeneration, lumbar region: Secondary | ICD-10-CM | POA: Diagnosis not present

## 2016-10-07 DIAGNOSIS — M9902 Segmental and somatic dysfunction of thoracic region: Secondary | ICD-10-CM | POA: Diagnosis not present

## 2016-10-07 DIAGNOSIS — M5413 Radiculopathy, cervicothoracic region: Secondary | ICD-10-CM | POA: Diagnosis not present

## 2016-10-09 DIAGNOSIS — M5136 Other intervertebral disc degeneration, lumbar region: Secondary | ICD-10-CM | POA: Diagnosis not present

## 2016-10-09 DIAGNOSIS — M9901 Segmental and somatic dysfunction of cervical region: Secondary | ICD-10-CM | POA: Diagnosis not present

## 2016-10-09 DIAGNOSIS — M546 Pain in thoracic spine: Secondary | ICD-10-CM | POA: Diagnosis not present

## 2016-10-09 DIAGNOSIS — M5413 Radiculopathy, cervicothoracic region: Secondary | ICD-10-CM | POA: Diagnosis not present

## 2016-10-09 DIAGNOSIS — M50323 Other cervical disc degeneration at C6-C7 level: Secondary | ICD-10-CM | POA: Diagnosis not present

## 2016-10-09 DIAGNOSIS — M9902 Segmental and somatic dysfunction of thoracic region: Secondary | ICD-10-CM | POA: Diagnosis not present

## 2016-10-12 DIAGNOSIS — M791 Myalgia: Secondary | ICD-10-CM | POA: Diagnosis not present

## 2016-10-12 DIAGNOSIS — M47892 Other spondylosis, cervical region: Secondary | ICD-10-CM | POA: Diagnosis not present

## 2016-10-12 DIAGNOSIS — M542 Cervicalgia: Secondary | ICD-10-CM | POA: Diagnosis not present

## 2016-10-15 DIAGNOSIS — M6281 Muscle weakness (generalized): Secondary | ICD-10-CM | POA: Diagnosis not present

## 2016-10-15 DIAGNOSIS — M542 Cervicalgia: Secondary | ICD-10-CM | POA: Diagnosis not present

## 2016-10-19 DIAGNOSIS — M542 Cervicalgia: Secondary | ICD-10-CM | POA: Diagnosis not present

## 2016-10-19 DIAGNOSIS — M6281 Muscle weakness (generalized): Secondary | ICD-10-CM | POA: Diagnosis not present

## 2016-10-22 DIAGNOSIS — M542 Cervicalgia: Secondary | ICD-10-CM | POA: Diagnosis not present

## 2016-10-22 DIAGNOSIS — M6281 Muscle weakness (generalized): Secondary | ICD-10-CM | POA: Diagnosis not present

## 2016-10-26 DIAGNOSIS — M542 Cervicalgia: Secondary | ICD-10-CM | POA: Diagnosis not present

## 2016-10-26 DIAGNOSIS — M6281 Muscle weakness (generalized): Secondary | ICD-10-CM | POA: Diagnosis not present

## 2016-10-29 DIAGNOSIS — M6281 Muscle weakness (generalized): Secondary | ICD-10-CM | POA: Diagnosis not present

## 2016-10-29 DIAGNOSIS — M542 Cervicalgia: Secondary | ICD-10-CM | POA: Diagnosis not present

## 2016-11-02 DIAGNOSIS — M542 Cervicalgia: Secondary | ICD-10-CM | POA: Diagnosis not present

## 2016-11-02 DIAGNOSIS — M6281 Muscle weakness (generalized): Secondary | ICD-10-CM | POA: Diagnosis not present

## 2016-11-05 DIAGNOSIS — M542 Cervicalgia: Secondary | ICD-10-CM | POA: Diagnosis not present

## 2016-11-05 DIAGNOSIS — M6281 Muscle weakness (generalized): Secondary | ICD-10-CM | POA: Diagnosis not present

## 2016-11-06 DIAGNOSIS — Z96642 Presence of left artificial hip joint: Secondary | ICD-10-CM | POA: Diagnosis not present

## 2016-11-06 DIAGNOSIS — M79652 Pain in left thigh: Secondary | ICD-10-CM | POA: Diagnosis not present

## 2016-11-06 DIAGNOSIS — M791 Myalgia: Secondary | ICD-10-CM | POA: Diagnosis not present

## 2016-11-06 DIAGNOSIS — Z96652 Presence of left artificial knee joint: Secondary | ICD-10-CM | POA: Diagnosis not present

## 2016-11-09 ENCOUNTER — Encounter: Payer: Self-pay | Admitting: *Deleted

## 2016-11-09 ENCOUNTER — Other Ambulatory Visit: Payer: Self-pay | Admitting: *Deleted

## 2016-11-09 DIAGNOSIS — M542 Cervicalgia: Secondary | ICD-10-CM | POA: Diagnosis not present

## 2016-11-09 DIAGNOSIS — M6281 Muscle weakness (generalized): Secondary | ICD-10-CM | POA: Diagnosis not present

## 2016-11-09 MED ORDER — ENZALUTAMIDE 40 MG PO CAPS
160.0000 mg | ORAL_CAPSULE | Freq: Every day | ORAL | 0 refills | Status: DC
Start: 2016-11-09 — End: 2016-11-16

## 2016-11-11 ENCOUNTER — Other Ambulatory Visit: Payer: Self-pay | Admitting: *Deleted

## 2016-11-11 ENCOUNTER — Telehealth: Payer: Self-pay | Admitting: *Deleted

## 2016-11-11 DIAGNOSIS — C61 Malignant neoplasm of prostate: Secondary | ICD-10-CM

## 2016-11-11 DIAGNOSIS — M542 Cervicalgia: Secondary | ICD-10-CM | POA: Diagnosis not present

## 2016-11-11 DIAGNOSIS — M6281 Muscle weakness (generalized): Secondary | ICD-10-CM | POA: Diagnosis not present

## 2016-11-11 NOTE — Telephone Encounter (Signed)
Received call from [place], that patient is scheduled for a CT of chest/abd/pelvis with contrast on 11/19/16. However, patient has allergies to contrast dye. Per Dr. Alen Blew, change the order to CT of chest/abd/pelvis without contrast. New orders entered. I will call the patient and inform him that he will not need to do the 13 hour prep. Labs that day changed from 12:30 pm to 11:30 am. I will inform the patient of change in time.

## 2016-11-12 ENCOUNTER — Telehealth: Payer: Self-pay | Admitting: Medical Oncology

## 2016-11-12 ENCOUNTER — Other Ambulatory Visit: Payer: Self-pay | Admitting: Medical Oncology

## 2016-11-12 NOTE — Telephone Encounter (Signed)
Scheduled CT A/P without contrast for may 3.

## 2016-11-16 ENCOUNTER — Telehealth: Payer: Self-pay | Admitting: *Deleted

## 2016-11-16 DIAGNOSIS — M542 Cervicalgia: Secondary | ICD-10-CM | POA: Diagnosis not present

## 2016-11-16 DIAGNOSIS — M6281 Muscle weakness (generalized): Secondary | ICD-10-CM | POA: Diagnosis not present

## 2016-11-16 MED ORDER — ENZALUTAMIDE 40 MG PO CAPS: 160.0000 mg | ORAL_CAPSULE | Freq: Every day | ORAL | 0 refills | Status: DC

## 2016-11-16 NOTE — Telephone Encounter (Signed)
"  This is Justin Porter calling for my husband [name].  We have not received the Xtandi.  We usually receive refill by the fifteenth.  I called the number in the letter we received September 17, 2016 for new pharmacy assistance [phone]) and no order has been received.  He runs out on Thursday."  Per Epic an [name] order was sent to [facility] on 11-09-2016.  Number provided is [place] who report "all enzalutamide patient assistance orders have been transferred to [place].  We can take electronic orders or phone orders."  Phone order provided exactly as was ordered on 11-09-2016.  Pharmacist reports "all documents are on file.  Will contact patient to set up delivery".  Ms. Gilchrest given this information.  Updated preferred pharmacy list.

## 2016-11-18 ENCOUNTER — Other Ambulatory Visit: Payer: Medicare Other

## 2016-11-19 ENCOUNTER — Encounter (HOSPITAL_COMMUNITY): Payer: Self-pay

## 2016-11-19 ENCOUNTER — Ambulatory Visit (HOSPITAL_COMMUNITY)
Admission: RE | Admit: 2016-11-19 | Discharge: 2016-11-19 | Disposition: A | Discharge status: Home / Self Care | Payer: Medicare Other | Source: Ambulatory Visit | Attending: Oncology | Admitting: Oncology

## 2016-11-19 ENCOUNTER — Other Ambulatory Visit: Payer: Medicare Other

## 2016-11-19 ENCOUNTER — Other Ambulatory Visit (HOSPITAL_BASED_OUTPATIENT_CLINIC_OR_DEPARTMENT_OTHER): Payer: Medicare Other

## 2016-11-19 ENCOUNTER — Other Ambulatory Visit: Payer: Self-pay | Admitting: *Deleted

## 2016-11-19 DIAGNOSIS — C61 Malignant neoplasm of prostate: Secondary | ICD-10-CM | POA: Diagnosis not present

## 2016-11-19 DIAGNOSIS — I2510 Atherosclerotic heart disease of native coronary artery without angina pectoris: Secondary | ICD-10-CM | POA: Diagnosis not present

## 2016-11-19 DIAGNOSIS — C7951 Secondary malignant neoplasm of bone: Secondary | ICD-10-CM

## 2016-11-19 DIAGNOSIS — N329 Bladder disorder, unspecified: Secondary | ICD-10-CM | POA: Insufficient documentation

## 2016-11-19 DIAGNOSIS — M542 Cervicalgia: Secondary | ICD-10-CM | POA: Diagnosis not present

## 2016-11-19 DIAGNOSIS — I700 Atherosclerosis of aorta: Secondary | ICD-10-CM | POA: Insufficient documentation

## 2016-11-19 DIAGNOSIS — Z902 Acquired absence of lung [part of]: Secondary | ICD-10-CM | POA: Insufficient documentation

## 2016-11-19 DIAGNOSIS — R918 Other nonspecific abnormal finding of lung field: Secondary | ICD-10-CM | POA: Diagnosis not present

## 2016-11-19 DIAGNOSIS — Z85118 Personal history of other malignant neoplasm of bronchus and lung: Secondary | ICD-10-CM | POA: Diagnosis not present

## 2016-11-19 DIAGNOSIS — M4856XA Collapsed vertebra, not elsewhere classified, lumbar region, initial encounter for fracture: Secondary | ICD-10-CM | POA: Diagnosis not present

## 2016-11-19 DIAGNOSIS — J479 Bronchiectasis, uncomplicated: Secondary | ICD-10-CM | POA: Diagnosis not present

## 2016-11-19 DIAGNOSIS — N3289 Other specified disorders of bladder: Secondary | ICD-10-CM | POA: Diagnosis not present

## 2016-11-19 DIAGNOSIS — M6281 Muscle weakness (generalized): Secondary | ICD-10-CM | POA: Diagnosis not present

## 2016-11-19 LAB — COMPREHENSIVE METABOLIC PANEL
ALBUMIN: 3.8 g/dL (ref 3.5–5.0)
ALK PHOS: 212 U/L — AB (ref 40–150)
ALT: 12 U/L (ref 0–55)
AST: 21 U/L (ref 5–34)
Anion Gap: 8 mEq/L (ref 3–11)
BUN: 16.1 mg/dL (ref 7.0–26.0)
CO2: 32 mEq/L — ABNORMAL HIGH (ref 22–29)
Calcium: 9.4 mg/dL (ref 8.4–10.4)
Chloride: 98 mEq/L (ref 98–109)
Creatinine: 0.9 mg/dL (ref 0.7–1.3)
EGFR: 73 mL/min/{1.73_m2} — ABNORMAL LOW (ref >90)
Glucose: 98 mg/dl (ref 70–140)
POTASSIUM: 4.2 meq/L (ref 3.5–5.1)
Sodium: 138 mEq/L (ref 136–145)
Total Bilirubin: 0.36 mg/dL (ref 0.20–1.20)
Total Protein: 7 g/dL (ref 6.4–8.3)

## 2016-11-19 LAB — CBC WITH DIFFERENTIAL/PLATELET
BASO%: 0.7 % (ref 0.0–2.0)
BASOS ABS: 0 10*3/uL (ref 0.0–0.1)
EOS ABS: 0.1 10*3/uL (ref 0.0–0.5)
EOS%: 2.6 % (ref 0.0–7.0)
HEMATOCRIT: 38.6 % (ref 38.4–49.9)
HEMOGLOBIN: 13 g/dL (ref 13.0–17.1)
LYMPH#: 1.5 10*3/uL (ref 0.9–3.3)
LYMPH%: 27.4 % (ref 14.0–49.0)
MCH: 32.7 pg (ref 27.2–33.4)
MCHC: 33.7 g/dL (ref 32.0–36.0)
MCV: 97.1 fL (ref 79.3–98.0)
MONO#: 0.5 10*3/uL (ref 0.1–0.9)
MONO%: 9.2 % (ref 0.0–14.0)
NEUT#: 3.4 10*3/uL (ref 1.5–6.5)
NEUT%: 60.1 % (ref 39.0–75.0)
PLATELETS: 279 10*3/uL (ref 140–400)
RBC: 3.98 10*6/uL — ABNORMAL LOW (ref 4.20–5.82)
RDW: 13.2 % (ref 11.0–14.6)
WBC: 5.6 10*3/uL (ref 4.0–10.3)

## 2016-11-19 MED ORDER — IOPAMIDOL (ISOVUE-300) INJECTION 61%
INTRAVENOUS | Status: AC
  Administered 2016-11-19: 30 mL
  Filled 2016-11-19: qty 30

## 2016-11-19 MED ORDER — ENZALUTAMIDE 40 MG PO CAPS: 160.0000 mg | ORAL_CAPSULE | Freq: Every day | ORAL | 0 refills | Status: DC

## 2016-11-20 LAB — PSA: Prostate Specific Ag, Serum: 75.3 ng/mL — ABNORMAL HIGH (ref 0.0–4.0)

## 2016-11-20 LAB — TESTOSTERONE: Testosterone, Serum: 187 ng/dL — ABNORMAL LOW (ref 264–916)

## 2016-11-23 DIAGNOSIS — M542 Cervicalgia: Secondary | ICD-10-CM | POA: Diagnosis not present

## 2016-11-23 DIAGNOSIS — M6281 Muscle weakness (generalized): Secondary | ICD-10-CM | POA: Diagnosis not present

## 2016-11-26 ENCOUNTER — Telehealth: Payer: Self-pay | Admitting: Oncology

## 2016-11-26 ENCOUNTER — Ambulatory Visit (HOSPITAL_BASED_OUTPATIENT_CLINIC_OR_DEPARTMENT_OTHER): Payer: Medicare Other | Admitting: Oncology

## 2016-11-26 VITALS — BP 166/67 | HR 86 | Temp 98.1°F | Resp 18 | Wt 189.1 lb

## 2016-11-26 DIAGNOSIS — C7951 Secondary malignant neoplasm of bone: Secondary | ICD-10-CM

## 2016-11-26 DIAGNOSIS — C61 Malignant neoplasm of prostate: Secondary | ICD-10-CM | POA: Diagnosis not present

## 2016-11-26 DIAGNOSIS — M6281 Muscle weakness (generalized): Secondary | ICD-10-CM | POA: Diagnosis not present

## 2016-11-26 DIAGNOSIS — M542 Cervicalgia: Secondary | ICD-10-CM | POA: Diagnosis not present

## 2016-11-26 DIAGNOSIS — C7800 Secondary malignant neoplasm of unspecified lung: Secondary | ICD-10-CM

## 2016-11-26 MED ORDER — ABIRATERONE ACETATE 250 MG PO TABS: 1000.0000 mg | ORAL_TABLET | Freq: Every day | ORAL | 0 refills | Status: DC

## 2016-11-26 NOTE — Progress Notes (Signed)
Hematology and Oncology Follow Up Visit  Justin Porter [id number] July 04, 1929 83 y.o. 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 His initial diagnosis was in 1995.  Prior Therapy:  He is status post prostatectomy and subsequently developed biochemical relapse treated with salvage radiation therapy. He did develop presumed pulmonary metastasis status post surgical resection in 2003. The pathology confirmed the presence of adenocarcinoma from a prostate etiology. He has been on androgen depravation initially intermittently and subsequently continuously since 2013. He developed a rise in his PSA up to 31.56 with a doubling time less than 6 months in 05/2014 with bony metastasis.    Current therapy:   Androgen deprivation.  Xtandi started and December 2015. Therapy will concluded in May 2018 because of progression of disease.  Interim History:   Justin Porter presents today for a followup visit with his wife. Since the last visit, he reports no major changes in his health. He has developed a pain in his left leg predominantly in his inner thigh. He is able to ambulate with the help of a cane and continues to attend physical therapy. He denied any back pain or falls.  He continues to be on Xtandi without any major complications. He does not report any GI toxicities including nausea, abdominal pain or dyspepsia. He does not report anybone pain. Did not report any new respiratory symptoms such as cough, hemoptysis or difficulty breathing.his quality of life and performance status is not changed.  He does not report any headaches, blurry vision, syncope or seizures. He does not report any constitutional symptoms of fevers or chills. He has not reported any chest pain or palpitation. He has not reported any cough or hemoptysis.  He does not report any frequency urgency  or hesitancy or hematuria. He does not report any lymphadenopathy or petechiae. He does not report any skin rashes or lesions. He does not report any depression or side he does report hearing loss. Remainder of his review of systems unremarkable.      Medications: I have reviewed the patient's current medications.  Current Outpatient Prescriptions  Medication Sig Dispense Refill  . calcium citrate-vitamin D (CITRACAL+D) 315-200 MG-UNIT per tablet Take 1 tablet by mouth every morning.     . furosemide (LASIX) 20 MG tablet Take 20 mg by mouth 2 (two) times daily.    . Multiple Vitamin (MULITIVITAMIN WITH MINERALS) TABS Take 1 tablet by mouth every morning.    . polyethylene glycol (MIRALAX / GLYCOLAX) packet Take 17 g by mouth 2 (two) times daily. 14 each 0  . Potassium 99 MG TABS Take 1 tablet by mouth every morning.    . simvastatin (ZOCOR) 20 MG tablet Take 20 mg by mouth every evening.     Marland Kitchen abiraterone Acetate (ZYTIGA) 250 MG tablet Take 4 tablets (1,000 mg total) by mouth daily. Take on an empty stomach 1 hour before or 2 hours after a meal 120 tablet 0   No current facility-administered medications for this visit.      Allergies:  Allergies  Allergen Reactions  . Codeine Nausea And Vomiting  . Shrimp [Shellfish Allergy] Nausea And Vomiting  . Betadine [Povidone Iodine] Rash    Past Medical History, Surgical history, Social history, and Family History were reviewed and updated.   Physical Exam: Blood pressure (!) 166/67, pulse 86, temperature 98.1 F (36.7 C), temperature source Oral, resp. rate 18, weight  189 lb 1 oz (85.8 kg), SpO2 98 %. ECOG: 1 General appearance: Elderly gentleman appeared without distress. Head: Normocephalic, without obvious abnormality no oral ulcers or lesions. Neck: no adenopathy no thyroid masses. Lymph nodes: Cervical, supraclavicular, and axillary nodes normal. Heart:regular rate and rhythm, S1, S2 normal, no murmur, click, rub or  gallop Lung:chest clear, no wheezing, rales, normal symmetric air entry  Abdomin: soft, non-tender, without masses or organomegaly no rebound or guarding. EXT: trace edema bilateral lower extremities. Neurological examination: No deficits noted.   Lab Results: Lab Results  Component Value Date   WBC 5.6 11/19/2016   HGB 13.0 11/19/2016   HCT 38.6 11/19/2016   MCV 97.1 11/19/2016   PLT 279 11/19/2016     Chemistry      Component Value Date/Time   NA 138 11/19/2016 1132   K 4.2 11/19/2016 1132   CL 102 05/23/2014 0423   CO2 32 (H) 11/19/2016 1132   BUN 16.1 11/19/2016 1132   CREATININE 0.9 11/19/2016 1132      Component Value Date/Time   CALCIUM 9.4 11/19/2016 1132   ALKPHOS 212 (H) 11/19/2016 1132   AST 21 11/19/2016 1132   ALT 12 11/19/2016 1132   BILITOT 0.36 11/19/2016 1132      Results for Justin Porter (MRN [id number]) as of 11/26/2016 13:26  Ref. Range 04/16/2016 12:52 08/27/2016 13:04 11/19/2016 11:32  PSA Latest Ref Range: 0.0 - 4.0 ng/mL 14.1 (H) 39.5 (H) 75.3 (H)     EXAM: CT CHEST, ABDOMEN AND PELVIS WITHOUT CONTRAST  TECHNIQUE: Multidetector CT imaging of the chest, abdomen and pelvis was performed following the standard protocol without IV contrast.  COMPARISON:  11/07/2014  FINDINGS: CT CHEST FINDINGS  Cardiovascular: Coronary, aortic arch, and branch vessel atherosclerotic vascular disease.  Mediastinum/Nodes: No appreciable pathologic thoracic adenopathy. Small type 1 hiatal hernia.  Lungs/Pleura: Calcified right apical pleuroparenchymal scarring, stable.  Left upper lobectomy. Scattered pulmonary nodules, scattered sub-centimeter pulmonary nodules, primarily in the right lung, are stable compared the prior exam.  There small regions of cystic bronchiectasis in the left lung which are increased from prior. Several small left pulmonary nodules are stable. No definite new or enlarging pulmonary nodules are  observed.  Musculoskeletal: Old healed left rib fractures. Bony demineralization. Thoracic spondylosis.  CT ABDOMEN PELVIS FINDINGS  Hepatobiliary: Unremarkable  Pancreas: Unremarkable  Spleen: Unremarkable  Adrenals/Urinary Tract: Bladder wall thickening. Otherwise unremarkable.  Stomach/Bowel: Unremarkable  Vascular/Lymphatic: Aortoiliac atherosclerotic vascular disease. No pathologic adenopathy observed.  Reproductive: Prior prostatectomy.  Other: No supplemental non-categorized findings.  Musculoskeletal: Sclerosis and some lytic component of the previously destructive lesions of the left pubic rami, anterior wall of the left acetabulum, and right pubic body. Compare to previous, the fracture of the left pubic body may have some partial union, but there is an increase lytic component of tumor in the anterior wall and especially along the quadrilateral plate of the left acetabulum resulting in cortical destruction and likely soft tissue component of tumor. A left total hip prosthesis is in place. The right pubic lesion appears less sclerotic and more lytic on today' s exam.  There is soft tissue density around the left pubic body tracking up in the left rectus abdominus muscle, similar to prior.  There is 60% collapse of the L2 vertebral body, previously this was only about 20% collapse.  Posterolateral rod and pedicle screws at L3-4 with facet fusion at L3-4 and L4-5 bilaterally.  IMPRESSION: 1. Increased lytic and expansile component of metastatic disease to  the left hemipelvis and right pubic bone. This is most appreciable along the quadrilateral plate of the left acetabulum. 2. Further collapse of the L2 vertebral body, likely osteoporotic. 3. Numerous stable scattered pulmonary nodules in the lungs, no change from 2016. 4.  Aortic Atherosclerosis (ICD10-I70.0).  Coronary atherosclerosis. 5. Left upper lobectomy. 6. Small areas of cystic  bronchiectasis in the left lung are new from the prior exam. 7. Chronic bladder wall thickening       83 year old gentleman with the following issues:   1. Castration resistant metastatic prostate cancer with disease to the bone. His initial diagnosis was in 1995 status post prostatectomy and subsequently developed biochemical relapse treated with salvage radiation therapy.  He developed lung recurrence in 2003 and underwent surgical resection and the pathology reviewed above. PSA was up to 31.5 with a doubling time of less then 6 months. CT scan of the chest on 06/11/2014 showed disease relapse with a PSA up to 31.   He was started on Xtandi in December 2015 and have tolerated it well.  His PSA continues to rise with a rapid doubling time now less than 6 months. Imaging studies did show some slight progression of disease in his left hip.  Risks and benefits of switching Xtandi at this time were reviewed knees agreeable to proceed with definitive therapy. Complications associated with Zytiga were reviewed today which includes edema, hypokalemia, fatigue and hypertension. The plan is to continue Xtandi for the time being until he finishes his current supply and starting next month he will start Zytiga.  2. Androgen depravation: Have recommended he continues Lupron which he has been receiving every 6 months. His testosterone remains Porter by repeat androgen deprivation may be will help his PSA moving forward.  3. Bone directed therapy: This will be addressed he developed progression of disease in the bone. This will be deferred at this time.  4. Pulmonary nodules: CT scan obtained on 11/19/2016 shows stable pulmonary nodules.  5. Follow-up: Will be in 2 months.   Zola Button, MD 5/10/20181:40 PM

## 2016-11-26 NOTE — Telephone Encounter (Signed)
Gave patient AVS and calender per 5/10 los - lab and f/u 6/29

## 2016-11-30 DIAGNOSIS — M6281 Muscle weakness (generalized): Secondary | ICD-10-CM | POA: Diagnosis not present

## 2016-11-30 DIAGNOSIS — M542 Cervicalgia: Secondary | ICD-10-CM | POA: Diagnosis not present

## 2016-12-02 DIAGNOSIS — M542 Cervicalgia: Secondary | ICD-10-CM | POA: Diagnosis not present

## 2016-12-02 DIAGNOSIS — M6281 Muscle weakness (generalized): Secondary | ICD-10-CM | POA: Diagnosis not present

## 2016-12-03 ENCOUNTER — Telehealth: Payer: Self-pay | Admitting: Pharmacist

## 2016-12-03 DIAGNOSIS — C61 Malignant neoplasm of prostate: Secondary | ICD-10-CM

## 2016-12-03 MED ORDER — ABIRATERONE ACETATE 250 MG PO TABS: 1000.0000 mg | ORAL_TABLET | Freq: Every day | ORAL | 0 refills | Status: AC

## 2016-12-03 NOTE — Telephone Encounter (Signed)
Oral Chemotherapy Pharmacist Encounter  Received new prescription for Zytiga for the treatment of metastatic, castration-resistant prostate cancer that has progressed on Xtandi therapy. Labs from 11/19/16 reviewed, OK for treatment Current medication list in Epic assessed, no DDIs with Zytiga identified  No prescription insurance coverage on file in [place]. Prescription has been e-scribed to [facility] for benefits analysis.  Oral Oncology Clinic will continue to follow  Johny Drilling, PharmD, BCPS, BCOP 12/03/2016  2:53 PM Oral Oncology Clinic [phone]

## 2016-12-07 DIAGNOSIS — M542 Cervicalgia: Secondary | ICD-10-CM | POA: Diagnosis not present

## 2016-12-07 DIAGNOSIS — M6281 Muscle weakness (generalized): Secondary | ICD-10-CM | POA: Diagnosis not present

## 2016-12-08 ENCOUNTER — Other Ambulatory Visit: Payer: Self-pay | Admitting: Internal Medicine

## 2016-12-08 DIAGNOSIS — M79605 Pain in left leg: Secondary | ICD-10-CM | POA: Diagnosis not present

## 2016-12-08 DIAGNOSIS — R600 Localized edema: Secondary | ICD-10-CM | POA: Diagnosis not present

## 2016-12-08 DIAGNOSIS — M79604 Pain in right leg: Secondary | ICD-10-CM

## 2016-12-08 NOTE — Telephone Encounter (Signed)
Oral Chemotherapy Pharmacist Encounter  Received notification from [place] that patient does not have prescription medication coverage and would need to apply for manufacturer assistance in order to obtain Zytiga.  Oral Oncology Clinic will reach out to patient and offer manufacturer assistance help.  Johny Drilling, PharmD, BCPS, BCOP 12/08/2016  9:04 AM Oral Oncology Clinic [phone]

## 2016-12-10 ENCOUNTER — Other Ambulatory Visit: Payer: Self-pay | Admitting: Internal Medicine

## 2016-12-10 ENCOUNTER — Ambulatory Visit
Admission: RE | Admit: 2016-12-10 | Discharge: 2016-12-10 | Disposition: A | Discharge status: Home / Self Care | Payer: Medicare Other | Source: Ambulatory Visit | Attending: Internal Medicine | Admitting: Internal Medicine

## 2016-12-10 ENCOUNTER — Telehealth: Payer: Self-pay | Admitting: Pharmacist

## 2016-12-10 DIAGNOSIS — M79605 Pain in left leg: Secondary | ICD-10-CM

## 2016-12-10 DIAGNOSIS — I82402 Acute embolism and thrombosis of unspecified deep veins of left lower extremity: Secondary | ICD-10-CM | POA: Diagnosis not present

## 2016-12-10 DIAGNOSIS — I824Z2 Acute embolism and thrombosis of unspecified deep veins of left distal lower extremity: Secondary | ICD-10-CM | POA: Diagnosis not present

## 2016-12-10 NOTE — Telephone Encounter (Signed)
Oral Chemotherapy Pharmacist Encounter  I met patient's wife, Lelon Frohlich,  in [facility] lobby on to complete application for [place] ([employer])for [place].  Completed application faxed to [place] at [phone].  This encounter will continue to be updated until final determination.  Oral Oncology Clinic will continue to follow.  Johny Drilling, PharmD, BCPS, [place] 5/24/20184:57 PM Oral Oncology Clinic [phone]

## 2016-12-11 ENCOUNTER — Telehealth: Payer: Self-pay

## 2016-12-11 NOTE — Telephone Encounter (Signed)
Pt saw Dr Deforest Hoyles yesterday for leg pain and swelling. He had doppler done. Report can be seen in his chart. Dr Deforest Hoyles started him on eliquis 2 BID for 1 week then 1 BID after that. Dr Deforest Hoyles asked pt to make sure Dr Alen Blew is aware.

## 2016-12-16 DIAGNOSIS — M542 Cervicalgia: Secondary | ICD-10-CM | POA: Diagnosis not present

## 2016-12-16 DIAGNOSIS — M6281 Muscle weakness (generalized): Secondary | ICD-10-CM | POA: Diagnosis not present

## 2016-12-23 DIAGNOSIS — I82402 Acute embolism and thrombosis of unspecified deep veins of left lower extremity: Secondary | ICD-10-CM | POA: Diagnosis not present

## 2016-12-29 ENCOUNTER — Other Ambulatory Visit: Payer: Self-pay | Admitting: Internal Medicine

## 2016-12-29 ENCOUNTER — Telehealth: Payer: Self-pay | Admitting: *Deleted

## 2016-12-29 ENCOUNTER — Ambulatory Visit
Admission: RE | Admit: 2016-12-29 | Discharge: 2016-12-29 | Disposition: A | Discharge status: Home / Self Care | Payer: Medicare Other | Source: Ambulatory Visit | Attending: Internal Medicine | Admitting: Internal Medicine

## 2016-12-29 ENCOUNTER — Other Ambulatory Visit: Payer: Self-pay | Admitting: Oncology

## 2016-12-29 DIAGNOSIS — Z96642 Presence of left artificial hip joint: Secondary | ICD-10-CM | POA: Diagnosis not present

## 2016-12-29 DIAGNOSIS — M25552 Pain in left hip: Secondary | ICD-10-CM

## 2016-12-29 DIAGNOSIS — Z471 Aftercare following joint replacement surgery: Secondary | ICD-10-CM | POA: Diagnosis not present

## 2016-12-29 DIAGNOSIS — M79605 Pain in left leg: Secondary | ICD-10-CM | POA: Diagnosis not present

## 2016-12-29 DIAGNOSIS — C61 Malignant neoplasm of prostate: Secondary | ICD-10-CM

## 2016-12-29 NOTE — Telephone Encounter (Signed)
Received voicemail message from wife stating," Dr. Lorenda Hatchet was going to talk to Dr. Alen Blew today, and if I didn't hear anything by this afternoon to call the office and ask. My return number is [phone].

## 2016-12-29 NOTE — Progress Notes (Signed)
Patient experiencing increased pain and weakness in his left hip and leg. He did develop a deep vein thrombosis and treated with Eliquis by Dr. Deforest Hoyles.  His CT scan in May 2018 did show a large pelvic lesion because of progression of disease. I have recommended palliative radiation therapy and we will make the appropriate referral and attempt to palliate his symptoms at this time.

## 2016-12-30 ENCOUNTER — Other Ambulatory Visit: Payer: Self-pay | Admitting: Oncology

## 2016-12-30 ENCOUNTER — Encounter: Payer: Self-pay | Admitting: Radiation Oncology

## 2016-12-30 ENCOUNTER — Telehealth: Payer: Self-pay | Admitting: Oncology

## 2016-12-30 DIAGNOSIS — C61 Malignant neoplasm of prostate: Secondary | ICD-10-CM

## 2016-12-30 NOTE — Telephone Encounter (Signed)
sw wife to confirm 6/15 appt at 1 pm per sch msg

## 2016-12-30 NOTE — Telephone Encounter (Signed)
Per Dr. Alen Blew, I informed wife that patient has been referred to Radiation Oncology. Radiation Oncology will contact you to arrange appointments. Your appointment with Dr. Alen Blew has been moved up to 6/15/@ 1:00pm. Wife verbalized understanding.

## 2016-12-30 NOTE — Telephone Encounter (Signed)
Please let them know he was referred to radiation oncology. His appointment with me was moved to 6/15. (message to scheduling sent).

## 2017-01-01 ENCOUNTER — Ambulatory Visit (HOSPITAL_BASED_OUTPATIENT_CLINIC_OR_DEPARTMENT_OTHER): Payer: Medicare Other | Admitting: Oncology

## 2017-01-01 VITALS — BP 146/66 | HR 75 | Temp 98.6°F | Resp 16 | Ht 72.0 in | Wt 188.9 lb

## 2017-01-01 DIAGNOSIS — C7951 Secondary malignant neoplasm of bone: Secondary | ICD-10-CM | POA: Diagnosis not present

## 2017-01-01 DIAGNOSIS — C7800 Secondary malignant neoplasm of unspecified lung: Secondary | ICD-10-CM

## 2017-01-01 DIAGNOSIS — C61 Malignant neoplasm of prostate: Secondary | ICD-10-CM | POA: Diagnosis not present

## 2017-01-01 DIAGNOSIS — R531 Weakness: Secondary | ICD-10-CM | POA: Diagnosis not present

## 2017-01-01 NOTE — Progress Notes (Signed)
Hematology and Oncology Follow Up Visit  Justin Porter [id number] 1929/01/22 83 y.o. 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 His initial diagnosis was in 1995.  Prior Therapy:  He is status post prostatectomy and subsequently developed biochemical relapse treated with salvage radiation therapy. He did develop presumed pulmonary metastasis status post surgical resection in 2003. The pathology confirmed the presence of adenocarcinoma from a prostate etiology. He has been on androgen depravation initially intermittently and subsequently continuously since 2013. He developed a rise in his PSA up to 31.56 with a doubling time less than 6 months in 05/2014 with bony metastasis.    Current therapy:   Androgen deprivation.  Xtandi started and December 2015. Therapy to be discontinued in May 2018 and switched to [place] although he remains on [place] for the time being.  Interim History:   Justin Porter presents today for a followup visit with his wife. Since the last visit, he reported some decline in his overall health. He developed left leg edema and weakness in the last few weeks. He was evaluated by Dr. Deforest Hoyles and was diagnosed with deep vein thrombosis. He was started on Eliquis. Imaging studies done on 12/29/2016 showed a expansile destructive tumor involving much of the left pubic symphysis extending to the left acetabulum which is likely contributing to his weakness. Despite the tumor size, he does not report any pain but is unable to bear weight at this time. He is wheelchair bound and uses a walker and a cane for short distances. He denied any falls or syncope. He denied any loss of appetite.  He was switched to [facility] in May 2018 although he has not received it yet. He continues on Xtandi awaiting the start of Zytiga.   He does not report any headaches,  blurry vision, syncope or seizures. He does not report any constitutional symptoms of fevers or chills. He has not reported any chest pain or palpitation. He has not reported any cough or hemoptysis.  He does not report any frequency urgency or hesitancy or hematuria. He does not report any lymphadenopathy or petechiae. He does not report any skin rashes or lesions. He does not report any depression or side he does report hearing loss. Remainder of his review of systems unremarkable.      Medications: I have reviewed the patient's current medications.  Current Outpatient Prescriptions  Medication Sig Dispense Refill  . abiraterone Acetate (ZYTIGA) 250 MG tablet Take 4 tablets (1,000 mg total) by mouth daily. Take on an empty stomach 1 hour before or 2 hours after a meal 120 tablet 0  . calcium citrate-vitamin D (CITRACAL+D) 315-200 MG-UNIT per tablet Take 1 tablet by mouth every morning.     . furosemide (LASIX) 20 MG tablet Take 20 mg by mouth 2 (two) times daily.    . Multiple Vitamin (MULITIVITAMIN WITH MINERALS) TABS Take 1 tablet by mouth every morning.    . polyethylene glycol (MIRALAX / GLYCOLAX) packet Take 17 g by mouth 2 (two) times daily. 14 each 0  . Potassium 99 MG TABS Take 1 tablet by mouth every morning.    . simvastatin (ZOCOR) 20 MG tablet Take 20 mg by mouth every evening.      No current facility-administered medications for this visit.      Allergies:  Allergies  Allergen Reactions  . Codeine Nausea And Vomiting  . Shrimp [Shellfish Allergy]  Nausea And Vomiting  . Betadine [Povidone Iodine] Rash    Past Medical History, Surgical history, Social history, and Family History were reviewed and updated.   Physical Exam: Blood pressure (!) 146/66, pulse 75, temperature 98.6 F (37 C), temperature source Oral, resp. rate 16, height 6' (1.829 m), weight 188 lb 14.4 oz (85.7 kg), SpO2 98 %. ECOG: 1 General appearance: Elderly gentleman Who is awake and alert. Staining in  a wheelchair. Head: Normocephalic, without obvious abnormality no oral thrush or ulcers. Neck: no adenopathy no thyroid masses. Lymph nodes: Cervical, supraclavicular, and axillary nodes normal. Heart:regular rate and rhythm, S1, S2 normal, no murmur, click, rub or gallop Lung:chest clear, no wheezing, rales, normal symmetric air entry  Abdomin: soft, non-tender, without masses or organomegaly no shifting dullness or ascites. EXT: Edema noted in the left leg more than the right. Neurological examination: No deficits noted.   Lab Results: Lab Results  Component Value Date   WBC 5.6 11/19/2016   HGB 13.0 11/19/2016   HCT 38.6 11/19/2016   MCV 97.1 11/19/2016   PLT 279 11/19/2016     Chemistry      Component Value Date/Time   NA 138 11/19/2016 1132   K 4.2 11/19/2016 1132   CL 102 05/23/2014 0423   CO2 32 (H) 11/19/2016 1132   BUN 16.1 11/19/2016 1132   CREATININE 0.9 11/19/2016 1132      Component Value Date/Time   CALCIUM 9.4 11/19/2016 1132   ALKPHOS 212 (H) 11/19/2016 1132   AST 21 11/19/2016 1132   ALT 12 11/19/2016 1132   BILITOT 0.36 11/19/2016 1132      Results for Justin Porter, Justin Porter (MRN [id number]) as of 01/01/2017 13:02  Ref. Range 08/27/2016 13:04 11/19/2016 11:32  PSA Latest Ref Range: 0.0 - 4.0 ng/mL 39.5 (H) 75.3 (H)    EXAM: CT CHEST, ABDOMEN AND PELVIS WITHOUT CONTRAST  TECHNIQUE: Multidetector CT imaging of the chest, abdomen and pelvis was performed following the standard protocol without IV contrast.  COMPARISON:  11/07/2014  FINDINGS: CT CHEST FINDINGS  Cardiovascular: Coronary, aortic arch, and branch vessel atherosclerotic vascular disease.  Mediastinum/Nodes: No appreciable pathologic thoracic adenopathy. Small type 1 hiatal hernia.  Lungs/Pleura: Calcified right apical pleuroparenchymal scarring, stable.  Left upper lobectomy. Scattered pulmonary nodules, scattered sub-centimeter pulmonary nodules, primarily in the right lung,  are stable compared the prior exam.  There small regions of cystic bronchiectasis in the left lung which are increased from prior. Several small left pulmonary nodules are stable. No definite new or enlarging pulmonary nodules are observed.  Musculoskeletal: Old healed left rib fractures. Bony demineralization. Thoracic spondylosis.  CT ABDOMEN PELVIS FINDINGS  Hepatobiliary: Unremarkable  Pancreas: Unremarkable  Spleen: Unremarkable  Adrenals/Urinary Tract: Bladder wall thickening. Otherwise unremarkable.  Stomach/Bowel: Unremarkable  Vascular/Lymphatic: Aortoiliac atherosclerotic vascular disease. No pathologic adenopathy observed.  Reproductive: Prior prostatectomy.  Other: No supplemental non-categorized findings.  Musculoskeletal: Sclerosis and some lytic component of the previously destructive lesions of the left pubic rami, anterior wall of the left acetabulum, and right pubic body. Compare to previous, the fracture of the left pubic body may have some partial union, but there is an increase lytic component of tumor in the anterior wall and especially along the quadrilateral plate of the left acetabulum resulting in cortical destruction and likely soft tissue component of tumor. A left total hip prosthesis is in place. The right pubic lesion appears less sclerotic and more lytic on today' s exam.  There is soft tissue density around the  left pubic body tracking up in the left rectus abdominus muscle, similar to prior.  There is 60% collapse of the L2 vertebral body, previously this was only about 20% collapse.  Posterolateral rod and pedicle screws at L3-4 with facet fusion at L3-4 and L4-5 bilaterally.  IMPRESSION: 1. Increased lytic and expansile component of metastatic disease to the left hemipelvis and right pubic bone. This is most appreciable along the quadrilateral plate of the left acetabulum. 2. Further collapse of the L2 vertebral  body, likely osteoporotic. 3. Numerous stable scattered pulmonary nodules in the lungs, no change from 2016. 4.  Aortic Atherosclerosis (ICD10-I70.0).  Coronary atherosclerosis. 5. Left upper lobectomy. 6. Small areas of cystic bronchiectasis in the left lung are new from the prior exam. 7. Chronic bladder wall thickening       83 year old gentleman with the following issues:   1. Castration resistant metastatic prostate cancer with disease to the bone. His initial diagnosis was in 1995 status post prostatectomy and subsequently developed biochemical relapse treated with salvage radiation therapy.  He developed lung recurrence in 2003 and underwent surgical resection and the pathology reviewed above. PSA was up to 31.5 with a doubling time of less then 6 months. CT scan of the chest on 06/11/2014 showed disease relapse with a PSA up to 31.   He was started on Xtandi in December 2015 and have tolerated it well.  His disease has progressed in the last few months with a PSA up to 75.3. Imaging studies showed a large tumor in the left pelvic area.  The plan was to switch him to [facility] but has not received that yet. Alternative option would be systemic chemotherapy which she is a marginal candidate for.  His overall prognosis is rather poor unfortunately. He appears to be progressing rather rapidly and not a candidate for aggressive therapy. He is willing to try Zytiga that if no success or improvement is noted, hospice will be his next option. This was discussed with his wife and granddaughter accompanied him today.  2. Weakness of the left leg: Related to a large tumor although there is no pain noted. I asked Dr. Tammi Klippel to evaluate his tumor whether radiation would play a role. He received prostate radiation at the time of diagnosis although he does not remember the details of that.  3. Bone directed therapy: This will be addressed he developed progression of disease in the bone. This  will be deferred at this time.  4. Pulmonary nodules: CT scan obtained on 11/19/2016 shows stable pulmonary nodules.  5. Follow-up: Will be in 2 weeks to follow his progress.  Zola Button, MD 6/15/20181:16 PM

## 2017-01-04 ENCOUNTER — Telehealth: Payer: Self-pay | Admitting: Oncology

## 2017-01-04 NOTE — Telephone Encounter (Signed)
NO LOS per 6/15

## 2017-01-05 DIAGNOSIS — C7951 Secondary malignant neoplasm of bone: Secondary | ICD-10-CM | POA: Insufficient documentation

## 2017-01-05 NOTE — Progress Notes (Signed)
Histology and Location of Primary Cancer: Prostate  Sites of Visceral and Bony Metastatic Disease: Lung and Bone  Location(s) of Symptomatic Metastases: Tumor of Left pubic symphysis extending to the left acetabulum which is causing left leg weakness, inability to bear weight on left extremity.    Past/Anticipated chemotherapy by medical oncology, if any: Dr. Alen Blew primary Oncologist [name] started December 2015 - May 2018 Was to start [place] in May 2018, still pending arrival of new medication.  Altnerative option if unable to get Zytiga, would be systemic chemotherapy but is a marginal candidate for thus Hospice would be next option. Patient's wife reports they have no received Zytiga yet but, they do understand they did not qualify for the grant  Pain on a scale of 0-10 is: Reports pain with ambulation. Denies pain while sitting. Reports he stopped PT one month ago. Patient reports he was prescribed Tramadol to manage pain but, is allergic. Patient confirms he has not prescription pain medication to take to manage his pain.     If Spine Met(s), symptoms, if any, include:  Bowel/Bladder retention or incontinence (please describe): no  Numbness or weakness in extremities (please describe): Left leg. Reports he uses a walker at home "to get around while dragging his left leg behind him."  Current Decadron regimen, if applicable: no  Ambulatory status? Walker? Wheelchair?: Wheelchair, walker/cane for short distances. Denies any recent falls.   SAFETY ISSUES:  Prior radiation? Yes. Prostate radiation by Danny Lawless in 2001  Pacemaker/ICD? no  Possible current pregnancy? no  Is the patient on methotrexate? no  Current Complaints / other details: 83 year old male. Married for 65 years. 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 Lives in [place]. Enjoys bowling. Blood clot found in left leg approximately three weeks ago.

## 2017-01-06 ENCOUNTER — Encounter: Payer: Self-pay | Admitting: Radiation Oncology

## 2017-01-06 ENCOUNTER — Ambulatory Visit
Admission: RE | Admit: 2017-01-06 | Discharge: 2017-01-06 | Disposition: A | Discharge status: Home / Self Care | Payer: Medicare Other | Source: Ambulatory Visit | Attending: Radiation Oncology | Admitting: Radiation Oncology

## 2017-01-06 VITALS — BP 153/83 | HR 73 | Resp 16 | Ht 72.0 in | Wt 188.9 lb

## 2017-01-06 DIAGNOSIS — Z79891 Long term (current) use of opiate analgesic: Secondary | ICD-10-CM | POA: Diagnosis not present

## 2017-01-06 DIAGNOSIS — Z96642 Presence of left artificial hip joint: Secondary | ICD-10-CM | POA: Diagnosis not present

## 2017-01-06 DIAGNOSIS — I10 Essential (primary) hypertension: Secondary | ICD-10-CM | POA: Diagnosis not present

## 2017-01-06 DIAGNOSIS — E785 Hyperlipidemia, unspecified: Secondary | ICD-10-CM | POA: Diagnosis not present

## 2017-01-06 DIAGNOSIS — R102 Pelvic and perineal pain: Secondary | ICD-10-CM | POA: Diagnosis not present

## 2017-01-06 DIAGNOSIS — Z79899 Other long term (current) drug therapy: Secondary | ICD-10-CM | POA: Insufficient documentation

## 2017-01-06 DIAGNOSIS — C61 Malignant neoplasm of prostate: Secondary | ICD-10-CM | POA: Diagnosis not present

## 2017-01-06 DIAGNOSIS — Z510 Encounter for antineoplastic radiation therapy: Secondary | ICD-10-CM | POA: Insufficient documentation

## 2017-01-06 DIAGNOSIS — Z9889 Other specified postprocedural states: Secondary | ICD-10-CM | POA: Insufficient documentation

## 2017-01-06 DIAGNOSIS — Z96651 Presence of right artificial knee joint: Secondary | ICD-10-CM | POA: Diagnosis not present

## 2017-01-06 DIAGNOSIS — Z885 Allergy status to narcotic agent status: Secondary | ICD-10-CM | POA: Insufficient documentation

## 2017-01-06 DIAGNOSIS — M79662 Pain in left lower leg: Secondary | ICD-10-CM | POA: Diagnosis not present

## 2017-01-06 DIAGNOSIS — Z923 Personal history of irradiation: Secondary | ICD-10-CM | POA: Insufficient documentation

## 2017-01-06 DIAGNOSIS — R600 Localized edema: Secondary | ICD-10-CM | POA: Insufficient documentation

## 2017-01-06 DIAGNOSIS — Z8042 Family history of malignant neoplasm of prostate: Secondary | ICD-10-CM | POA: Insufficient documentation

## 2017-01-06 DIAGNOSIS — C7951 Secondary malignant neoplasm of bone: Secondary | ICD-10-CM | POA: Diagnosis not present

## 2017-01-06 DIAGNOSIS — R29898 Other symptoms and signs involving the musculoskeletal system: Secondary | ICD-10-CM

## 2017-01-06 DIAGNOSIS — Z7901 Long term (current) use of anticoagulants: Secondary | ICD-10-CM | POA: Insufficient documentation

## 2017-01-06 DIAGNOSIS — Z86718 Personal history of other venous thrombosis and embolism: Secondary | ICD-10-CM | POA: Insufficient documentation

## 2017-01-06 DIAGNOSIS — M25552 Pain in left hip: Secondary | ICD-10-CM | POA: Diagnosis not present

## 2017-01-06 DIAGNOSIS — C7952 Secondary malignant neoplasm of bone marrow: Principal | ICD-10-CM

## 2017-01-06 DIAGNOSIS — Z96698 Presence of other orthopedic joint implants: Secondary | ICD-10-CM | POA: Insufficient documentation

## 2017-01-06 DIAGNOSIS — M898X9 Other specified disorders of bone, unspecified site: Secondary | ICD-10-CM

## 2017-01-06 DIAGNOSIS — Z91013 Allergy to seafood: Secondary | ICD-10-CM | POA: Diagnosis not present

## 2017-01-06 DIAGNOSIS — Z883 Allergy status to other anti-infective agents status: Secondary | ICD-10-CM | POA: Insufficient documentation

## 2017-01-06 DIAGNOSIS — G893 Neoplasm related pain (acute) (chronic): Secondary | ICD-10-CM | POA: Diagnosis not present

## 2017-01-06 HISTORY — DX: Malignant neoplasm of prostate: C61

## 2017-01-06 MED ORDER — HYDROCODONE-ACETAMINOPHEN 5-325 MG PO TABS: 1.0000 | ORAL_TABLET | Freq: Four times a day (QID) | ORAL | 0 refills | Status: DC | PRN

## 2017-01-06 NOTE — Progress Notes (Signed)
Radiation Oncology         [phone] ________________________________  Initial outpatient Consultation  Name: Justin Porter MRN: [id number]  Date: 01/06/2017  DOB: August 29, 1928  CH:ENIDPO, [name], MD  Wyatt Portela, MD   REFERRING PHYSICIAN: Wyatt Portela, MD  DIAGNOSIS: The primary encounter diagnosis was Malignant neoplasm of prostate [facility]). A diagnosis of Bone metastasis ([place]) was also pertinent to this visit.    ICD-10-CM   1. Malignant neoplasm of prostate ([place]) [place] [facility] referral to [place]  2. Bone metastasis ([facility]) C79.51     HISTORY OF PRESENT ILLNESS: Justin Porter is a 83 y.o. male seen at the request of Dr. Alen Blew for castrate resistant metastatic prostate cancer. He was originally diagnosed in 1995, and underwent prostatectomy. He developed biochemical recurrence and subsequently completed salvage radiotherapy to the prostatic fossa. He developed disease in the left lung. He subsequently underwent left upper lobectomy on 09/27/01 which revealed metastatic adenocarcinoma with no evidence of spread to the nodes, and the tumor measured 7 mm. He has been on androgen depravation since 2013, and developed a rise in PSA up to 31.56 with a doubling time of less than 6 months in November 2015. He added Xtandi in December 2015, and was switched to [facility] in May 2018 due to sclerotic and lytic changes of the bone in the left pubic rami, anterior wall of the left acetabulum, and right pubic body.  We was also recently diagnosed with a left common femoral vein DVT that was nonocclusive. A plain film on 12/29/16 of the left hip and pelvis revealed widespread bony metastatic disease in the left lower pelvis and right medial pubic symphysis, and expansitile tumor involving much of the left pubic symphysis extending to the left acetabulum. He comes today to discuss options of radiotherapy to the left hip.    PREVIOUS RADIATION THERAPY: Yes   05/04/2001 - 06/21/2001: received  salvage radiotherapy to the prostatic fossa (63 Gy)  PAST MEDICAL HISTORY:  Past Medical History:  Diagnosis Date  . Arthritis   . Hearing loss   . Hyperlipidemia   . Hypertension   . Prostate cancer ([place])    recurrent in the lung - had 1/3 of left lung removed and progressed to bone  . Urinary frequency       PAST SURGICAL HISTORY: Past Surgical History:  Procedure Laterality Date  . BACK SURGERY  4-5 yrs ago   lower back  . ELBOW SURGERY     left  surgery for fx 3 yrs ago, right done 40 yrs ago  . HERNIA REPAIR  01-14-12   2003-left  . INGUINAL HERNIA REPAIR  01/22/2012   Procedure: HERNIA REPAIR INGUINAL ADULT;  Surgeon: Adin Hector, MD;  Location: [facility];  Service: General;  Laterality: Right;  . JOINT REPLACEMENT  2012   L TOTAL KNEE  . LAPAROSCOPIC APPENDECTOMY  10/15/2011   Procedure: APPENDECTOMY LAPAROSCOPIC;  Surgeon: Stark Klein, MD;  Location: [facility];  Service: General;  Laterality: N/A;  . LOBECTOMY  2005  . LUNG BIOPSY  2005   resulted in 1/3 of left lung being removed  . PROSTATE SURGERY  1995  . TOTAL HIP ARTHROPLASTY Left 11/28/2013   Procedure: LEFT TOTAL HIP ARTHROPLASTY ANTERIOR APPROACH;  Surgeon: [name], MD;  Location: [facility];  Service: Orthopedics;  Laterality: Left;  . TOTAL KNEE ARTHROPLASTY Right 05/21/2014   Procedure: RIGHT TOTAL KNEE ARTHROPLASTY;  Surgeon: [name], MD;  Location: [facility];  Service: Orthopedics;  Laterality: Right;    FAMILY HISTORY:  Family History  Problem Relation Age of Onset  . Pancreatic cancer Sister     SOCIAL HISTORY:  Social History   Social History  . Marital status: Married    Spouse name: N/A  . Number of children: N/A  . Years of education: N/A   Occupational History  . Not on file.   Social History Main Topics  . Smoking status: Never Smoker  . Smokeless tobacco: Never Used  . Alcohol use No  . Drug use: No  . Sexual activity: Not Currently   Other Topics Concern  . Not on file     Social History Narrative  . No narrative on file  The patient is married and resides in [place]. He is retired from [employer].  ALLERGIES: Codeine; Shrimp [shellfish allergy]; Tramadol; and Betadine [povidone iodine]  MEDICATIONS:  Current Outpatient Prescriptions  Medication Sig Dispense Refill  . calcium citrate-vitamin D (CITRACAL+D) 315-200 MG-UNIT per tablet Take 1 tablet by mouth every morning.     Marland Kitchen ELIQUIS 5 MG TABS tablet Take 5 mg by mouth 2 (two) times daily.  6  . furosemide (LASIX) 20 MG tablet Take 20 mg by mouth 2 (two) times daily.    . Multiple Vitamin (MULITIVITAMIN WITH MINERALS) TABS Take 1 tablet by mouth every morning.    . simvastatin (ZOCOR) 20 MG tablet Take 20 mg by mouth every evening.     Marland Kitchen abiraterone Acetate (ZYTIGA) 250 MG tablet Take 4 tablets (1,000 mg total) by mouth daily. Take on an empty stomach 1 hour before or 2 hours after a meal (Patient not taking: Reported on 01/06/2017) 120 tablet 0  . HYDROcodone-acetaminophen (NORCO) 5-325 MG tablet Take 1-2 tablets by mouth every 6 (six) hours as needed for moderate pain. 30 tablet 0  . polyethylene glycol (MIRALAX / GLYCOLAX) packet Take 17 g by mouth 2 (two) times daily. (Patient not taking: Reported on 01/06/2017) 14 each 0  . Potassium 99 MG TABS Take 1 tablet by mouth every morning.     No current facility-administered medications for this encounter.     REVIEW OF SYSTEMS: On review of systems, the patient reports that he is doing well overall. He reports urinary frequency, regular bowel activity, and good appetite. Patient reports a gradual onset of pain in left hip and cannot bear weight on left leg over the last month. He denies any chest pain, shortness of breath, cough, fevers, chills, night sweats, unintended weight changes. He denies any bowel or bladder disturbances, and denies abdominal pain, nausea or vomiting. He denies any additional musculoskeletal or joint aches or pains, new skin lesions or  concerns. A complete review of systems is obtained and is otherwise negative.    PHYSICAL EXAM:  Wt Readings from Last 3 Encounters:  01/06/17 188 lb 14.4 oz (85.7 kg)  01/01/17 188 lb 14.4 oz (85.7 kg)  11/26/16 189 lb 1 oz (85.8 kg)   Temp Readings from Last 3 Encounters:  01/01/17 98.6 F (37 C) (Oral)  11/26/16 98.1 F (36.7 C) (Oral)  08/27/16 98 F (36.7 C) (Oral)   BP Readings from Last 3 Encounters:  01/06/17 (!) 153/83  01/01/17 (!) 146/66  11/26/16 (!) 166/67   Pulse Readings from Last 3 Encounters:  01/06/17 73  01/01/17 75  11/26/16 86   Pain Assessment Pain Score:  (see progress note)/10  In general this is a well appearing Caucasian male in no acute distress.  He is alert and oriented x4 and appropriate throughout the examination. HEENT reveals that the patient is normocephalic, atraumatic. Cardiovascular exam reveals a regular rate and rhythm, no clicks rubs or murmurs are auscultated. Chest is clear to auscultation bilaterally. Lymphatic assessment is performed and does not reveal any adenopathy in the cervical, supraclavicular, axillary, or inguinal chains. The abdomen is soft, non tender, non distended. In the left lower extremity, he has trace 1+ pitting edema 7 cm cephalad from medial malleolus extending to the mid thigh. LLE Strength is 4/5 with extension but reduced range of motion and ability to complete this assessment due to pain from bony metastasis.  RLE strength is 5/5.   KPS = 80%  100 - Normal; no complaints; no evidence of disease. 90   - Able to carry on normal activity; minor signs or symptoms of disease. 80   - Normal activity with effort; some signs or symptoms of disease. 35   - Cares for self; unable to carry on normal activity or to do active work. 60   - Requires occasional assistance, but is able to care for most of his personal needs. 50   - Requires considerable assistance and frequent medical care. 104   - Disabled; requires special  care and assistance. 18   - Severely disabled; hospital admission is indicated although death not imminent. 6   - Very sick; hospital admission necessary; active supportive treatment necessary. 10   - Moribund; fatal processes progressing rapidly. 0     - Dead  Karnofsky DA, Abelmann [place], Craver LS and Burchenal [facility] [phone]) The use of the nitrogen mustards in the palliative treatment of carcinoma: with particular reference to bronchogenic carcinoma Cancer 1 634-56   LABORATORY DATA:  Lab Results  Component Value Date   WBC 5.6 11/19/2016   HGB 13.0 11/19/2016   HCT 38.6 11/19/2016   MCV 97.1 11/19/2016   PLT 279 11/19/2016   Lab Results  Component Value Date   NA 138 11/19/2016   K 4.2 11/19/2016   CL 102 05/23/2014   CO2 32 (H) 11/19/2016   Lab Results  Component Value Date   ALT 12 11/19/2016   AST 21 11/19/2016   ALKPHOS 212 (H) 11/19/2016   BILITOT 0.36 11/19/2016     RADIOGRAPHY: US Venous Img Lower Unilateral Left  Result Date: 12/10/2016 CLINICAL DATA:  Acute left leg pain and swelling for 1 month. EXAM: LEFT LOWER EXTREMITY VENOUS DOPPLER ULTRASOUND TECHNIQUE: Gray-scale sonography with graded compression, as well as color Doppler and duplex ultrasound were performed to evaluate the lower extremity deep venous systems from the level of the common femoral vein and including the common femoral, femoral, profunda femoral, popliteal and calf veins including the posterior tibial, peroneal and gastrocnemius veins when visible. The superficial great saphenous vein was also interrogated. Spectral Doppler was utilized to evaluate flow at rest and with distal augmentation maneuvers in the common femoral, femoral and popliteal veins. COMPARISON:  None. FINDINGS: Nonocclusive thrombus within the left common femoral vein is identified. Normal flow, compressibility, and augmentation within the proximal profunda femoral, proximal greater saphenous, entire femoral, popliteal veins, and  imaged calf veins. IMPRESSION: Nonocclusive DVT within the left common femoral vein. Critical Value/emergent results were called by telephone at the time of interpretation on 12/10/2016 at 2:32 pm to Dr. [name] , who verbally acknowledged these results. Electronically Signed   By: Margarette Canada M.D.   On: 12/10/2016 14:34   Dg Hip Unilat With Pelvis 2-3  Views Left  Result Date: 12/29/2016 CLINICAL DATA:  Pain status post arthroplasty left hip. History of lung carcinoma EXAM: DG HIP (WITH OR WITHOUT PELVIS) 2-3V LEFT COMPARISON:  CT abdomen and pelvis with bony reformats Nov 19, 2016. FINDINGS: Patient is status post total hip replacement on the left with prosthetic components well-seated. Widespread mixed sclerotic and lytic metastatic disease is noted throughout the left superior pubic ramus and pubic symphysis with more patchy lytic disease throughout the right ischium. Lytic metastasis is noted in the left acetabulum. There has been a previous fracture in the left pubic symphysis region with attempted remodeling, stable. More subtle metastatic disease is noted in the right pubic symphysis region. Elsewhere bones are diffusely osteoporotic. No acute fracture or dislocation is evident. There is postoperative change in the lower lumbar region as well as in the pelvis itself. IMPRESSION: Widespread bony metastatic disease throughout the left lower pelvis and as well as to a much lesser extent in the medial right pubic symphysis. There is expansile destructive tumor involving much of the left pubic symphysis extending into the left acetabulum. Bones are osteoporotic. Old fracture left pubic symphysis. No acute fracture or dislocation. Total hip prosthesis on the left which appears well seated although there is tumor in the left acetabular region. Electronically Signed   By: Lowella Grip III M.D.   On: 12/29/2016 09:18      IMPRESSION/PLAN: 1. 83 y.o. gentleman with castrate resistant metastatic  prostate cancer to the left hip/pelvis. Today, we spoke with the patient and family about the findings and work-up thus far.  We discussed the natural history of metastatic prostate cancer and general treatment, highlighting the role of radiotherapy in the management.  We discussed the available radiation techniques, and focused on the details of logistics and delivery. We would recommend a course of 10 fractions to the left hip and pubic bone over 2 weeks. We reviewed the anticipated acute and late sequelae associated with radiation in this setting. The patient would like to proceed with radiation and will move forward with simulation today. Written consent is obtained and placed in the chart, a copy was provided to the patient. He is still awaiting his Zytiga prescription as well.   Given previous prostatic fossa radiotherapy, it will be important to limit overlap/re-irradiation to bladder, femoral neck and rectum. 2. Pain secondary to #1. I will prescribe hydrocodone today for pain management. I discussed with the patient how to take this and suggested increasing Miralax if the patient experiences constipation. 3. Deconditioning of the LLE, secondary to #1.  We will make a referral to [facility] for PT.  I spent 30 minutes minutes face to face with the patient and more than 50% of that time was spent in counseling and/or coordination of care.    Carola Rhine, PAC  with  Sheral Apley Tammi Klippel, M.D.     [facility] Direct Dial: [phone]  Fax: [phone] .com  Skype  [email or web address]  This document serves as a record of services personally performed by Tyler Pita, MD and Shona Simpson, PA-C. It was created on their behalf by Maryla Morrow, a trained medical scribe. The creation of this record is based on the scribe's personal observations and the provider's statements to them. This document has been checked and approved by the attending provider.

## 2017-01-06 NOTE — Progress Notes (Signed)
See progress note under physician encounter. 

## 2017-01-07 ENCOUNTER — Ambulatory Visit: Payer: Medicare Other | Admitting: Radiation Oncology

## 2017-01-07 ENCOUNTER — Ambulatory Visit: Payer: Medicare Other

## 2017-01-07 NOTE — Progress Notes (Signed)
  Radiation Oncology         [phone] ________________________________  Name: Justin Porter MRN: [id number]  Date: 01/06/2017  DOB: 01-01-1929  SIMULATION AND TREATMENT PLANNING NOTE    ICD-10-CM   1. Bone metastasis ([place]) C79.51     DIAGNOSIS:  83 yo man with pubic ramus metastasis from metastatic prostate cancer  NARRATIVE:  The patient was brought to the [facility].  Identity was confirmed.  All relevant records and images related to the planned course of therapy were reviewed.  The patient freely provided informed written consent to proceed with treatment after reviewing the details related to the planned course of therapy. The consent form was witnessed and verified by the simulation staff.  Then, the patient was set-up in a stable reproducible  supine position for radiation therapy.  CT images were obtained.  Surface markings were placed.  The CT images were loaded into the planning software.  Then the target and avoidance structures were contoured.  Treatment planning then occurred.  The radiation prescription was entered and confirmed.  Then, I designed and supervised the construction of a total of 1 medically necessary complex treatment device in the form of positioning BodyFix.  I have requested : 3D Simulation  I have requested a DVH of the following structures: left femoral neck, bladder, rectum and target volumes.   SPECIAL TREATMENT PROCEDURE:  The planned course of therapy using radiation constitutes a special treatment procedure. Special care is required in the management of this patient for the following reasons. This treatment constitutes a Special Treatment Procedure for the following reason: [ Retreatment in a previously radiated area requiring careful monitoring of increased risk of toxicity due to overlap of previous treatment..  The special nature of the planned course of radiotherapy will require increased physician supervision and oversight to ensure  patient's safety with optimal treatment outcomes.   PLAN:  The patient will receive 30 Gy in 10 fractions.  ________________________________  Sheral Apley Tammi Klippel, M.D.

## 2017-01-08 DIAGNOSIS — Z86718 Personal history of other venous thrombosis and embolism: Secondary | ICD-10-CM | POA: Diagnosis not present

## 2017-01-08 DIAGNOSIS — Z510 Encounter for antineoplastic radiation therapy: Secondary | ICD-10-CM | POA: Diagnosis not present

## 2017-01-08 DIAGNOSIS — C7951 Secondary malignant neoplasm of bone: Secondary | ICD-10-CM | POA: Diagnosis not present

## 2017-01-08 DIAGNOSIS — E785 Hyperlipidemia, unspecified: Secondary | ICD-10-CM | POA: Diagnosis not present

## 2017-01-08 DIAGNOSIS — Z923 Personal history of irradiation: Secondary | ICD-10-CM | POA: Diagnosis not present

## 2017-01-08 DIAGNOSIS — C61 Malignant neoplasm of prostate: Secondary | ICD-10-CM | POA: Diagnosis not present

## 2017-01-11 ENCOUNTER — Encounter: Payer: Self-pay | Admitting: Radiation Oncology

## 2017-01-11 ENCOUNTER — Ambulatory Visit
Admission: RE | Admit: 2017-01-11 | Discharge: 2017-01-11 | Disposition: A | Discharge status: Home / Self Care | Payer: Medicare Other | Source: Ambulatory Visit | Attending: Radiation Oncology | Admitting: Radiation Oncology

## 2017-01-11 DIAGNOSIS — E785 Hyperlipidemia, unspecified: Secondary | ICD-10-CM | POA: Diagnosis not present

## 2017-01-11 DIAGNOSIS — C61 Malignant neoplasm of prostate: Secondary | ICD-10-CM | POA: Diagnosis not present

## 2017-01-11 DIAGNOSIS — I82402 Acute embolism and thrombosis of unspecified deep veins of left lower extremity: Secondary | ICD-10-CM | POA: Diagnosis not present

## 2017-01-11 DIAGNOSIS — Z510 Encounter for antineoplastic radiation therapy: Secondary | ICD-10-CM | POA: Diagnosis not present

## 2017-01-11 DIAGNOSIS — M25559 Pain in unspecified hip: Secondary | ICD-10-CM | POA: Diagnosis not present

## 2017-01-11 DIAGNOSIS — Z86718 Personal history of other venous thrombosis and embolism: Secondary | ICD-10-CM | POA: Diagnosis not present

## 2017-01-11 DIAGNOSIS — R102 Pelvic and perineal pain: Secondary | ICD-10-CM | POA: Diagnosis not present

## 2017-01-11 DIAGNOSIS — Z923 Personal history of irradiation: Secondary | ICD-10-CM | POA: Diagnosis not present

## 2017-01-11 DIAGNOSIS — C7951 Secondary malignant neoplasm of bone: Secondary | ICD-10-CM | POA: Diagnosis not present

## 2017-01-11 NOTE — Telephone Encounter (Signed)
Oral Oncology Patient Advocate Encounter  Received notification from [employer] [place] that patient has been denied enrollment into their program to receive Zytiga from the drug manufacturer.      I spoke with patient's wife, Justin Porter, today.  Given that Mr. Justin Porter does not have prescription coverage that would cover this medication, I feel that writing a letter of appeal to [employer] is warranted.  I will send this letter tomorrow.    Patient and wife know to call the office with questions or concerns.  Oral Oncology Clinic will continue to follow.  Gilmore Laroche, CPhT, [facility] Oral Oncology Patient Advocate [phone] 01/11/2017 2:53 PM

## 2017-01-12 ENCOUNTER — Ambulatory Visit
Admission: RE | Admit: 2017-01-12 | Discharge: 2017-01-12 | Disposition: A | Discharge status: Home / Self Care | Payer: Medicare Other | Source: Ambulatory Visit | Attending: Radiation Oncology | Admitting: Radiation Oncology

## 2017-01-12 DIAGNOSIS — Z86718 Personal history of other venous thrombosis and embolism: Secondary | ICD-10-CM | POA: Diagnosis not present

## 2017-01-12 DIAGNOSIS — Z510 Encounter for antineoplastic radiation therapy: Secondary | ICD-10-CM | POA: Diagnosis not present

## 2017-01-12 DIAGNOSIS — C7951 Secondary malignant neoplasm of bone: Secondary | ICD-10-CM | POA: Diagnosis not present

## 2017-01-12 DIAGNOSIS — C61 Malignant neoplasm of prostate: Secondary | ICD-10-CM | POA: Diagnosis not present

## 2017-01-12 DIAGNOSIS — E785 Hyperlipidemia, unspecified: Secondary | ICD-10-CM | POA: Diagnosis not present

## 2017-01-12 DIAGNOSIS — Z923 Personal history of irradiation: Secondary | ICD-10-CM | POA: Diagnosis not present

## 2017-01-13 ENCOUNTER — Ambulatory Visit
Admission: RE | Admit: 2017-01-13 | Discharge: 2017-01-13 | Disposition: A | Discharge status: Home / Self Care | Payer: Medicare Other | Source: Ambulatory Visit | Attending: Radiation Oncology | Admitting: Radiation Oncology

## 2017-01-13 DIAGNOSIS — E785 Hyperlipidemia, unspecified: Secondary | ICD-10-CM | POA: Diagnosis not present

## 2017-01-13 DIAGNOSIS — C7951 Secondary malignant neoplasm of bone: Secondary | ICD-10-CM | POA: Diagnosis not present

## 2017-01-13 DIAGNOSIS — Z86718 Personal history of other venous thrombosis and embolism: Secondary | ICD-10-CM | POA: Diagnosis not present

## 2017-01-13 DIAGNOSIS — Z510 Encounter for antineoplastic radiation therapy: Secondary | ICD-10-CM | POA: Diagnosis not present

## 2017-01-13 DIAGNOSIS — Z923 Personal history of irradiation: Secondary | ICD-10-CM | POA: Diagnosis not present

## 2017-01-13 DIAGNOSIS — C61 Malignant neoplasm of prostate: Secondary | ICD-10-CM | POA: Diagnosis not present

## 2017-01-14 ENCOUNTER — Ambulatory Visit
Admission: RE | Admit: 2017-01-14 | Discharge: 2017-01-14 | Disposition: A | Discharge status: Home / Self Care | Payer: Medicare Other | Source: Ambulatory Visit | Attending: Radiation Oncology | Admitting: Radiation Oncology

## 2017-01-14 DIAGNOSIS — Z510 Encounter for antineoplastic radiation therapy: Secondary | ICD-10-CM | POA: Diagnosis not present

## 2017-01-14 DIAGNOSIS — C61 Malignant neoplasm of prostate: Secondary | ICD-10-CM | POA: Diagnosis not present

## 2017-01-14 DIAGNOSIS — C7951 Secondary malignant neoplasm of bone: Secondary | ICD-10-CM | POA: Diagnosis not present

## 2017-01-14 DIAGNOSIS — E785 Hyperlipidemia, unspecified: Secondary | ICD-10-CM | POA: Diagnosis not present

## 2017-01-14 DIAGNOSIS — Z923 Personal history of irradiation: Secondary | ICD-10-CM | POA: Diagnosis not present

## 2017-01-14 DIAGNOSIS — Z86718 Personal history of other venous thrombosis and embolism: Secondary | ICD-10-CM | POA: Diagnosis not present

## 2017-01-15 ENCOUNTER — Ambulatory Visit
Admission: RE | Admit: 2017-01-15 | Discharge: 2017-01-15 | Disposition: A | Discharge status: Home / Self Care | Payer: Medicare Other | Source: Ambulatory Visit | Attending: Radiation Oncology | Admitting: Radiation Oncology

## 2017-01-15 ENCOUNTER — Ambulatory Visit (HOSPITAL_BASED_OUTPATIENT_CLINIC_OR_DEPARTMENT_OTHER): Payer: Medicare Other | Admitting: Oncology

## 2017-01-15 ENCOUNTER — Other Ambulatory Visit: Payer: Self-pay | Admitting: Radiation Oncology

## 2017-01-15 ENCOUNTER — Telehealth: Payer: Self-pay | Admitting: Oncology

## 2017-01-15 ENCOUNTER — Other Ambulatory Visit (HOSPITAL_BASED_OUTPATIENT_CLINIC_OR_DEPARTMENT_OTHER): Payer: Medicare Other

## 2017-01-15 VITALS — BP 155/74 | HR 70 | Temp 97.9°F | Resp 20 | Ht 72.0 in

## 2017-01-15 DIAGNOSIS — C61 Malignant neoplasm of prostate: Secondary | ICD-10-CM | POA: Diagnosis not present

## 2017-01-15 DIAGNOSIS — C7951 Secondary malignant neoplasm of bone: Secondary | ICD-10-CM | POA: Diagnosis not present

## 2017-01-15 DIAGNOSIS — G893 Neoplasm related pain (acute) (chronic): Secondary | ICD-10-CM | POA: Diagnosis not present

## 2017-01-15 DIAGNOSIS — Z86718 Personal history of other venous thrombosis and embolism: Secondary | ICD-10-CM | POA: Diagnosis not present

## 2017-01-15 DIAGNOSIS — C7800 Secondary malignant neoplasm of unspecified lung: Secondary | ICD-10-CM | POA: Diagnosis not present

## 2017-01-15 DIAGNOSIS — Z510 Encounter for antineoplastic radiation therapy: Secondary | ICD-10-CM | POA: Diagnosis not present

## 2017-01-15 DIAGNOSIS — Z96642 Presence of left artificial hip joint: Secondary | ICD-10-CM

## 2017-01-15 DIAGNOSIS — Z923 Personal history of irradiation: Secondary | ICD-10-CM | POA: Diagnosis not present

## 2017-01-15 DIAGNOSIS — E785 Hyperlipidemia, unspecified: Secondary | ICD-10-CM | POA: Diagnosis not present

## 2017-01-15 LAB — CBC WITH DIFFERENTIAL/PLATELET
BASO%: 0.4 % (ref 0.0–2.0)
Basophils Absolute: 0 10*3/uL (ref 0.0–0.1)
EOS%: 1.3 % (ref 0.0–7.0)
Eosinophils Absolute: 0.1 10*3/uL (ref 0.0–0.5)
HCT: 36.1 % — ABNORMAL LOW (ref 38.4–49.9)
HGB: 12 g/dL — ABNORMAL LOW (ref 13.0–17.1)
LYMPH#: 1 10*3/uL (ref 0.9–3.3)
LYMPH%: 18.1 % (ref 14.0–49.0)
MCH: 32.2 pg (ref 27.2–33.4)
MCHC: 33.2 g/dL (ref 32.0–36.0)
MCV: 96.8 fL (ref 79.3–98.0)
MONO#: 0.5 10*3/uL (ref 0.1–0.9)
MONO%: 10 % (ref 0.0–14.0)
NEUT#: 3.8 10*3/uL (ref 1.5–6.5)
NEUT%: 70.2 % (ref 39.0–75.0)
Platelets: 266 10*3/uL (ref 140–400)
RBC: 3.73 10*6/uL — AB (ref 4.20–5.82)
RDW: 12.9 % (ref 11.0–14.6)
WBC: 5.4 10*3/uL (ref 4.0–10.3)

## 2017-01-15 LAB — COMPREHENSIVE METABOLIC PANEL
ALT: 8 U/L (ref 0–55)
AST: 20 U/L (ref 5–34)
Albumin: 3.6 g/dL (ref 3.5–5.0)
Alkaline Phosphatase: 229 U/L — ABNORMAL HIGH (ref 40–150)
Anion Gap: 9 mEq/L (ref 3–11)
BUN: 14.9 mg/dL (ref 7.0–26.0)
CALCIUM: 9.5 mg/dL (ref 8.4–10.4)
CHLORIDE: 97 meq/L — AB (ref 98–109)
CO2: 30 meq/L — AB (ref 22–29)
CREATININE: 0.8 mg/dL (ref 0.7–1.3)
EGFR: 80 mL/min/{1.73_m2} — ABNORMAL LOW (ref >90)
Glucose: 98 mg/dl (ref 70–140)
Potassium: 4.1 mEq/L (ref 3.5–5.1)
Sodium: 136 mEq/L (ref 136–145)
Total Bilirubin: 0.61 mg/dL (ref 0.20–1.20)
Total Protein: 7.1 g/dL (ref 6.4–8.3)

## 2017-01-15 MED ORDER — HYDROCODONE-ACETAMINOPHEN 5-325 MG PO TABS: 1.0000 | ORAL_TABLET | Freq: Four times a day (QID) | ORAL | 0 refills | Status: DC | PRN

## 2017-01-15 NOTE — Telephone Encounter (Signed)
Scheduled appt per 6/29 los - Gave patient AVS and calender per los.

## 2017-01-15 NOTE — Progress Notes (Signed)
Hematology and Oncology Follow Up Visit  Justin Porter [id number] 1928-10-02 83 y.o. 01/15/2017 12:18 PM [name], MDHusain, Justin Ar, MD   Principle Diagnosis: 83 year old gentleman with castration resistant metastatic prostate cancer with disease to the lung. His initial diagnosis was in 1995.  Prior Therapy:  He is status post prostatectomy and subsequently developed biochemical relapse treated with salvage radiation therapy. He did develop presumed pulmonary metastasis status post surgical resection in 2003. The pathology confirmed the presence of adenocarcinoma from a prostate etiology. He has been on androgen depravation initially intermittently and subsequently continuously since 2013. He developed a rise in his PSA up to 31.56 with a doubling time less than 6 months in 05/2014 with bony metastasis.    Current therapy:   Androgen deprivation.  Xtandi started and December 2015. He is in the process of obtaining Zytiga in the near future.  Interim History:   Justin Porter presents today for a followup visit with his wife. Since the last visit, he reported started radiation therapy for his left hip and has tolerated it well. He denied any excessive pain but still has some weakness with mobility. He still able to ambulate short distances with the help of a walker. His pain is reasonably managed at this time with hydrocodone. He denied any falls or syncope. He denied any pathological fractures. He continues to take [place] as he awaits the approval of Zytiga.   He does not report any headaches, blurry vision, syncope or seizures. He does not report any constitutional symptoms of fevers or chills. He has not reported any chest pain or palpitation. He has not reported any cough or hemoptysis.  He does not report any frequency urgency or hesitancy or hematuria. He does not report any lymphadenopathy or petechiae. He does not report any skin rashes or lesions. He does not report any depression  or side he does report hearing loss. Remainder of his review of systems unremarkable.      Medications: I have reviewed the patient's current medications.  Current Outpatient Prescriptions  Medication Sig Dispense Refill  . abiraterone Acetate (ZYTIGA) 250 MG tablet Take 4 tablets (1,000 mg total) by mouth daily. Take on an empty stomach 1 hour before or 2 hours after a meal 120 tablet 0  . calcium citrate-vitamin D (CITRACAL+D) 315-200 MG-UNIT per tablet Take 1 tablet by mouth every morning.     Marland Kitchen ELIQUIS 5 MG TABS tablet Take 5 mg by mouth 2 (two) times daily.  6  . furosemide (LASIX) 20 MG tablet Take 20 mg by mouth 2 (two) times daily.    Marland Kitchen HYDROcodone-acetaminophen (NORCO) 5-325 MG tablet Take 1-2 tablets by mouth every 6 (six) hours as needed for moderate pain. 60 tablet 0  . Multiple Vitamin (MULITIVITAMIN WITH MINERALS) TABS Take 1 tablet by mouth every morning.    . polyethylene glycol (MIRALAX / GLYCOLAX) packet Take 17 g by mouth 2 (two) times daily. 14 each 0  . Potassium 99 MG TABS Take 1 tablet by mouth every morning.    . simvastatin (ZOCOR) 20 MG tablet Take 20 mg by mouth every evening.      No current facility-administered medications for this visit.      Allergies:  Allergies  Allergen Reactions  . Codeine Nausea And Vomiting  . Shrimp [Shellfish Allergy] Nausea And Vomiting  . Tramadol Nausea And Vomiting  . Betadine [Povidone Iodine] Rash    Past Medical History, Surgical history, Social history, and Family History were reviewed and  updated.   Physical Exam: Blood pressure (!) 155/74, pulse 70, temperature 97.9 F (36.6 C), temperature source Oral, resp. rate 20, height 6' (1.829 m), SpO2 100 %. ECOG: 1 General appearance: Alert, awake gentleman without distress. Head: Normocephalic, without obvious abnormality no oral ulcers or lesions. Neck: no adenopathy n Lymph nodes: Cervical, supraclavicular, and axillary nodes normal. Heart:regular rate and rhythm,  S1, S2 normal, no murmur, click, rub or gallop Lung:chest clear, no wheezing, rales, normal symmetric air entry  Abdomin: soft, non-tender, without masses or organomegaly no rebound or guarding. EXT: Edema noted in the left leg more than the right. Neurological examination: No deficits noted.   Lab Results: Lab Results  Component Value Date   WBC 5.4 01/15/2017   HGB 12.0 (L) 01/15/2017   HCT 36.1 (L) 01/15/2017   MCV 96.8 01/15/2017   PLT 266 01/15/2017     Chemistry      Component Value Date/Time   NA 138 11/19/2016 1132   K 4.2 11/19/2016 1132   CL 102 05/23/2014 0423   CO2 32 (H) 11/19/2016 1132   BUN 16.1 11/19/2016 1132   CREATININE 0.9 11/19/2016 1132      Component Value Date/Time   CALCIUM 9.4 11/19/2016 1132   ALKPHOS 212 (H) 11/19/2016 1132   AST 21 11/19/2016 1132   ALT 12 11/19/2016 1132   BILITOT 0.36 11/19/2016 1132      Results for Justin Porter, Justin Porter (MRN [id number]) as of 01/01/2017 13:02  Ref. Range 08/27/2016 13:04 11/19/2016 11:32  PSA Latest Ref Range: 0.0 - 4.0 ng/mL 39.5 (H) 75.3 (H)    IMPRESSION: 1. Increased lytic and expansile component of metastatic disease to the left hemipelvis and right pubic bone. This is most appreciable along the quadrilateral plate of the left acetabulum. 2. Further collapse of the L2 vertebral body, likely osteoporotic. 3. Numerous stable scattered pulmonary nodules in the lungs, no change from 2016. 4.  Aortic Atherosclerosis (ICD10-I70.0).  Coronary atherosclerosis. 5. Left upper lobectomy. 6. Small areas of cystic bronchiectasis in the left lung are new from the prior exam. 7. Chronic bladder wall thickening       83 year old gentleman with the following issues:   1. Castration resistant metastatic prostate cancer with disease to the bone. His initial diagnosis was in 1995 status post prostatectomy and subsequently developed biochemical relapse treated with salvage radiation therapy.  He developed lung  recurrence in 2003 and underwent surgical resection and the pathology reviewed above. PSA was up to 31.5 with a doubling time of less then 6 months. CT scan of the chest on 06/11/2014 showed disease relapse with a PSA up to 31.   He was started on Xtandi in December 2015 and have tolerated it well.  His disease has progressed in the last few months with a PSA up to 75.3. Imaging studies showed a large tumor in the left pelvic area.  The plan was to switch him to [facility] but has not received that yet. I anticipate that he will start that upon completing radiation therapy.   2. Weakness of the left leg: Related to a large tumor although there is no pain noted. He is receiving radiation therapy to the hip and have tolerated it well.  3. Bone directed therapy: This will be addressed he developed progression of disease in the bone. This will be deferred at this time.  4. Pulmonary nodules: CT scan obtained on 11/19/2016 shows stable pulmonary nodules.  5. Follow-up: Will be in 4 weeks to follow  his progress.   Zola Button, MD 6/29/201812:18 PM

## 2017-01-16 LAB — PSA: PROSTATE SPECIFIC AG, SERUM: 118.8 ng/mL — AB (ref 0.0–4.0)

## 2017-01-18 ENCOUNTER — Ambulatory Visit
Admission: RE | Admit: 2017-01-18 | Discharge: 2017-01-18 | Disposition: A | Discharge status: Home / Self Care | Payer: Medicare Other | Source: Ambulatory Visit | Attending: Radiation Oncology | Admitting: Radiation Oncology

## 2017-01-18 DIAGNOSIS — Z510 Encounter for antineoplastic radiation therapy: Secondary | ICD-10-CM | POA: Diagnosis not present

## 2017-01-18 DIAGNOSIS — C61 Malignant neoplasm of prostate: Secondary | ICD-10-CM | POA: Diagnosis not present

## 2017-01-18 DIAGNOSIS — C7951 Secondary malignant neoplasm of bone: Secondary | ICD-10-CM | POA: Diagnosis not present

## 2017-01-18 DIAGNOSIS — Z923 Personal history of irradiation: Secondary | ICD-10-CM | POA: Diagnosis not present

## 2017-01-18 DIAGNOSIS — E785 Hyperlipidemia, unspecified: Secondary | ICD-10-CM | POA: Diagnosis not present

## 2017-01-18 DIAGNOSIS — Z86718 Personal history of other venous thrombosis and embolism: Secondary | ICD-10-CM | POA: Diagnosis not present

## 2017-01-19 ENCOUNTER — Ambulatory Visit
Admission: RE | Admit: 2017-01-19 | Discharge: 2017-01-19 | Disposition: A | Discharge status: Home / Self Care | Payer: Medicare Other | Source: Ambulatory Visit | Attending: Radiation Oncology | Admitting: Radiation Oncology

## 2017-01-19 DIAGNOSIS — E785 Hyperlipidemia, unspecified: Secondary | ICD-10-CM | POA: Diagnosis not present

## 2017-01-19 DIAGNOSIS — Z86718 Personal history of other venous thrombosis and embolism: Secondary | ICD-10-CM | POA: Diagnosis not present

## 2017-01-19 DIAGNOSIS — C7951 Secondary malignant neoplasm of bone: Secondary | ICD-10-CM | POA: Diagnosis not present

## 2017-01-19 DIAGNOSIS — Z923 Personal history of irradiation: Secondary | ICD-10-CM | POA: Diagnosis not present

## 2017-01-19 DIAGNOSIS — C61 Malignant neoplasm of prostate: Secondary | ICD-10-CM | POA: Diagnosis not present

## 2017-01-19 DIAGNOSIS — Z510 Encounter for antineoplastic radiation therapy: Secondary | ICD-10-CM | POA: Diagnosis not present

## 2017-01-21 ENCOUNTER — Ambulatory Visit
Admission: RE | Admit: 2017-01-21 | Discharge: 2017-01-21 | Disposition: A | Discharge status: Home / Self Care | Payer: Medicare Other | Source: Ambulatory Visit | Attending: Radiation Oncology | Admitting: Radiation Oncology

## 2017-01-21 DIAGNOSIS — Z86718 Personal history of other venous thrombosis and embolism: Secondary | ICD-10-CM | POA: Diagnosis not present

## 2017-01-21 DIAGNOSIS — C7951 Secondary malignant neoplasm of bone: Secondary | ICD-10-CM | POA: Diagnosis not present

## 2017-01-21 DIAGNOSIS — E785 Hyperlipidemia, unspecified: Secondary | ICD-10-CM | POA: Diagnosis not present

## 2017-01-21 DIAGNOSIS — Z923 Personal history of irradiation: Secondary | ICD-10-CM | POA: Diagnosis not present

## 2017-01-21 DIAGNOSIS — Z510 Encounter for antineoplastic radiation therapy: Secondary | ICD-10-CM | POA: Diagnosis not present

## 2017-01-21 DIAGNOSIS — C61 Malignant neoplasm of prostate: Secondary | ICD-10-CM | POA: Diagnosis not present

## 2017-01-22 ENCOUNTER — Encounter: Payer: Self-pay | Admitting: *Deleted

## 2017-01-22 ENCOUNTER — Ambulatory Visit
Admission: RE | Admit: 2017-01-22 | Discharge: 2017-01-22 | Disposition: A | Discharge status: Home / Self Care | Payer: Medicare Other | Source: Ambulatory Visit | Attending: Radiation Oncology | Admitting: Radiation Oncology

## 2017-01-22 DIAGNOSIS — C7951 Secondary malignant neoplasm of bone: Secondary | ICD-10-CM | POA: Diagnosis not present

## 2017-01-22 DIAGNOSIS — Z923 Personal history of irradiation: Secondary | ICD-10-CM | POA: Diagnosis not present

## 2017-01-22 DIAGNOSIS — Z510 Encounter for antineoplastic radiation therapy: Secondary | ICD-10-CM | POA: Diagnosis not present

## 2017-01-22 DIAGNOSIS — Z86718 Personal history of other venous thrombosis and embolism: Secondary | ICD-10-CM | POA: Diagnosis not present

## 2017-01-22 DIAGNOSIS — C61 Malignant neoplasm of prostate: Secondary | ICD-10-CM | POA: Diagnosis not present

## 2017-01-22 DIAGNOSIS — E785 Hyperlipidemia, unspecified: Secondary | ICD-10-CM | POA: Diagnosis not present

## 2017-01-25 ENCOUNTER — Telehealth: Payer: Self-pay | Admitting: Medical Oncology

## 2017-01-25 ENCOUNTER — Encounter: Payer: Self-pay | Admitting: Radiation Oncology

## 2017-01-25 ENCOUNTER — Ambulatory Visit
Admission: RE | Admit: 2017-01-25 | Discharge: 2017-01-25 | Disposition: A | Discharge status: Home / Self Care | Payer: Medicare Other | Source: Ambulatory Visit | Attending: Radiation Oncology | Admitting: Radiation Oncology

## 2017-01-25 DIAGNOSIS — Z86718 Personal history of other venous thrombosis and embolism: Secondary | ICD-10-CM | POA: Diagnosis not present

## 2017-01-25 DIAGNOSIS — C61 Malignant neoplasm of prostate: Secondary | ICD-10-CM | POA: Diagnosis not present

## 2017-01-25 DIAGNOSIS — C7951 Secondary malignant neoplasm of bone: Secondary | ICD-10-CM | POA: Diagnosis not present

## 2017-01-25 DIAGNOSIS — Z923 Personal history of irradiation: Secondary | ICD-10-CM | POA: Diagnosis not present

## 2017-01-25 DIAGNOSIS — E785 Hyperlipidemia, unspecified: Secondary | ICD-10-CM | POA: Diagnosis not present

## 2017-01-25 DIAGNOSIS — Z510 Encounter for antineoplastic radiation therapy: Secondary | ICD-10-CM | POA: Diagnosis not present

## 2017-01-25 NOTE — Telephone Encounter (Signed)
Opened chart in error.

## 2017-01-26 DIAGNOSIS — R262 Difficulty in walking, not elsewhere classified: Secondary | ICD-10-CM | POA: Diagnosis not present

## 2017-01-26 DIAGNOSIS — M25552 Pain in left hip: Secondary | ICD-10-CM | POA: Diagnosis not present

## 2017-01-26 DIAGNOSIS — M6281 Muscle weakness (generalized): Secondary | ICD-10-CM | POA: Diagnosis not present

## 2017-01-27 NOTE — Progress Notes (Signed)
  Radiation Oncology         [phone] ________________________________  Name: Justin Porter MRN: [id number]  Date: 01/25/2017  DOB: 24-Jan-1929  End of Treatment Note  Diagnosis:      83 y.o. man with painful pubic ramus metastasis from metastatic prostate cancer  Indication for treatment:  Palliative  Radiation treatment dates:   01/11/2017 - 01/25/2017  Site/dose:   The left hip was treated to 30 Gy in 10 fractions of 3 Gy.  Beams/energy:   3D technique / 15X, 10X  Narrative: The patient tolerated radiation treatment relatively well. He reported a normal appetite and no change in urinary or bowel patterns, but he experienced nocturia x5-6.  He denied diarrhea or pain but reported pelvic pain when he attempted to walk, so he used a walker and wheelchair.  Left LE edema was noted, but the patient reported no LE numbness or tingling.   Plan: The patient has completed radiation treatment. The patient will return to radiation oncology clinic for routine followup in one month on 03/09/2017. I advised him to call or return sooner if he has any questions or concerns related to his recovery or treatment. ________________________________  Sheral Apley. Tammi Klippel, M.D.  This document serves as a record of services personally performed by Tyler Pita, MD. It was created on his behalf by Rae Lips, a trained medical scribe. The creation of this record is based on the scribe's personal observations and the provider's statements to them. This document has been checked and approved by the attending provider.

## 2017-01-28 DIAGNOSIS — R262 Difficulty in walking, not elsewhere classified: Secondary | ICD-10-CM | POA: Diagnosis not present

## 2017-01-28 DIAGNOSIS — M25552 Pain in left hip: Secondary | ICD-10-CM | POA: Diagnosis not present

## 2017-01-28 DIAGNOSIS — M6281 Muscle weakness (generalized): Secondary | ICD-10-CM | POA: Diagnosis not present

## 2017-01-29 ENCOUNTER — Telehealth: Payer: Self-pay | Admitting: *Deleted

## 2017-01-29 NOTE — Telephone Encounter (Signed)
Message from "[place] requesting refill for Xtandi 40 mg be called [phone]), faxed [phone]) or sent eRx."  01-15-2017 Office note read.  Zytiga on current medication list.  Completed radiation 01-25-2017.  Plan to begin Zytiga in near future after radiation.  Next scheduled F/U scheduled on 02-26-2017.     Returned call to notify this nurse cannot authorize refill, therapy may be ending.  "Per Ria Comment, patient is enrolled until December of this year.  If he needs this again just give [place] a call."

## 2017-02-01 DIAGNOSIS — M25552 Pain in left hip: Secondary | ICD-10-CM | POA: Diagnosis not present

## 2017-02-01 DIAGNOSIS — R262 Difficulty in walking, not elsewhere classified: Secondary | ICD-10-CM | POA: Diagnosis not present

## 2017-02-01 DIAGNOSIS — M6281 Muscle weakness (generalized): Secondary | ICD-10-CM | POA: Diagnosis not present

## 2017-02-04 DIAGNOSIS — M6281 Muscle weakness (generalized): Secondary | ICD-10-CM | POA: Diagnosis not present

## 2017-02-04 DIAGNOSIS — M25552 Pain in left hip: Secondary | ICD-10-CM | POA: Diagnosis not present

## 2017-02-04 DIAGNOSIS — R262 Difficulty in walking, not elsewhere classified: Secondary | ICD-10-CM | POA: Diagnosis not present

## 2017-02-05 DIAGNOSIS — R262 Difficulty in walking, not elsewhere classified: Secondary | ICD-10-CM | POA: Diagnosis not present

## 2017-02-05 DIAGNOSIS — M6281 Muscle weakness (generalized): Secondary | ICD-10-CM | POA: Diagnosis not present

## 2017-02-05 DIAGNOSIS — M25552 Pain in left hip: Secondary | ICD-10-CM | POA: Diagnosis not present

## 2017-02-08 DIAGNOSIS — M6281 Muscle weakness (generalized): Secondary | ICD-10-CM | POA: Diagnosis not present

## 2017-02-08 DIAGNOSIS — R262 Difficulty in walking, not elsewhere classified: Secondary | ICD-10-CM | POA: Diagnosis not present

## 2017-02-08 DIAGNOSIS — M25552 Pain in left hip: Secondary | ICD-10-CM | POA: Diagnosis not present

## 2017-02-10 DIAGNOSIS — M25552 Pain in left hip: Secondary | ICD-10-CM | POA: Diagnosis not present

## 2017-02-10 DIAGNOSIS — R262 Difficulty in walking, not elsewhere classified: Secondary | ICD-10-CM | POA: Diagnosis not present

## 2017-02-10 DIAGNOSIS — M6281 Muscle weakness (generalized): Secondary | ICD-10-CM | POA: Diagnosis not present

## 2017-02-12 DIAGNOSIS — M25552 Pain in left hip: Secondary | ICD-10-CM | POA: Diagnosis not present

## 2017-02-12 DIAGNOSIS — R262 Difficulty in walking, not elsewhere classified: Secondary | ICD-10-CM | POA: Diagnosis not present

## 2017-02-12 DIAGNOSIS — M6281 Muscle weakness (generalized): Secondary | ICD-10-CM | POA: Diagnosis not present

## 2017-02-12 NOTE — Telephone Encounter (Signed)
Oral Oncology Patient Advocate Encounter  Received notification from [facility] and [place] that patient has been denied enrollment into their program to receive Zytiga from the drug manufacturer.  The initial denial was appealed.  This appeal was unsuccessful and the denial was upheld.    I have left a message for the patient.   Please let me know if there is anything else I can do to be of assistance.    Gilmore Laroche, CPhT, [place] Oral Oncology Patient Advocate [phone] 01/11/2017 2:53 PM

## 2017-02-15 DIAGNOSIS — R262 Difficulty in walking, not elsewhere classified: Secondary | ICD-10-CM | POA: Diagnosis not present

## 2017-02-15 DIAGNOSIS — M6281 Muscle weakness (generalized): Secondary | ICD-10-CM | POA: Diagnosis not present

## 2017-02-15 DIAGNOSIS — M25552 Pain in left hip: Secondary | ICD-10-CM | POA: Diagnosis not present

## 2017-02-17 DIAGNOSIS — R262 Difficulty in walking, not elsewhere classified: Secondary | ICD-10-CM | POA: Diagnosis not present

## 2017-02-17 DIAGNOSIS — M6281 Muscle weakness (generalized): Secondary | ICD-10-CM | POA: Diagnosis not present

## 2017-02-17 DIAGNOSIS — M25552 Pain in left hip: Secondary | ICD-10-CM | POA: Diagnosis not present

## 2017-02-19 DIAGNOSIS — M6281 Muscle weakness (generalized): Secondary | ICD-10-CM | POA: Diagnosis not present

## 2017-02-19 DIAGNOSIS — R262 Difficulty in walking, not elsewhere classified: Secondary | ICD-10-CM | POA: Diagnosis not present

## 2017-02-19 DIAGNOSIS — M25552 Pain in left hip: Secondary | ICD-10-CM | POA: Diagnosis not present

## 2017-02-22 DIAGNOSIS — M6281 Muscle weakness (generalized): Secondary | ICD-10-CM | POA: Diagnosis not present

## 2017-02-22 DIAGNOSIS — R262 Difficulty in walking, not elsewhere classified: Secondary | ICD-10-CM | POA: Diagnosis not present

## 2017-02-22 DIAGNOSIS — M25552 Pain in left hip: Secondary | ICD-10-CM | POA: Diagnosis not present

## 2017-02-25 DIAGNOSIS — H35033 Hypertensive retinopathy, bilateral: Secondary | ICD-10-CM | POA: Diagnosis not present

## 2017-02-25 DIAGNOSIS — H26492 Other secondary cataract, left eye: Secondary | ICD-10-CM | POA: Diagnosis not present

## 2017-02-25 DIAGNOSIS — H04123 Dry eye syndrome of bilateral lacrimal glands: Secondary | ICD-10-CM | POA: Diagnosis not present

## 2017-02-25 DIAGNOSIS — Z961 Presence of intraocular lens: Secondary | ICD-10-CM | POA: Diagnosis not present

## 2017-02-26 ENCOUNTER — Telehealth: Payer: Self-pay | Admitting: Oncology

## 2017-02-26 ENCOUNTER — Ambulatory Visit (HOSPITAL_BASED_OUTPATIENT_CLINIC_OR_DEPARTMENT_OTHER): Payer: Medicare Other | Admitting: Oncology

## 2017-02-26 ENCOUNTER — Other Ambulatory Visit (HOSPITAL_BASED_OUTPATIENT_CLINIC_OR_DEPARTMENT_OTHER): Payer: Medicare Other

## 2017-02-26 VITALS — BP 144/52 | HR 82 | Temp 98.7°F | Resp 17 | Ht 72.0 in | Wt 183.5 lb

## 2017-02-26 DIAGNOSIS — C61 Malignant neoplasm of prostate: Secondary | ICD-10-CM

## 2017-02-26 DIAGNOSIS — C7800 Secondary malignant neoplasm of unspecified lung: Secondary | ICD-10-CM

## 2017-02-26 DIAGNOSIS — M6281 Muscle weakness (generalized): Secondary | ICD-10-CM | POA: Diagnosis not present

## 2017-02-26 DIAGNOSIS — C7951 Secondary malignant neoplasm of bone: Secondary | ICD-10-CM | POA: Diagnosis not present

## 2017-02-26 DIAGNOSIS — Z923 Personal history of irradiation: Secondary | ICD-10-CM | POA: Diagnosis not present

## 2017-02-26 DIAGNOSIS — M25552 Pain in left hip: Secondary | ICD-10-CM | POA: Diagnosis not present

## 2017-02-26 DIAGNOSIS — R262 Difficulty in walking, not elsewhere classified: Secondary | ICD-10-CM | POA: Diagnosis not present

## 2017-02-26 LAB — CBC WITH DIFFERENTIAL/PLATELET
BASO%: 0.7 % (ref 0.0–2.0)
Basophils Absolute: 0 10*3/uL (ref 0.0–0.1)
EOS ABS: 0.1 10*3/uL (ref 0.0–0.5)
EOS%: 2.7 % (ref 0.0–7.0)
HCT: 39 % (ref 38.4–49.9)
HGB: 13.1 g/dL (ref 13.0–17.1)
LYMPH%: 23.3 % (ref 14.0–49.0)
MCH: 32.2 pg (ref 27.2–33.4)
MCHC: 33.6 g/dL (ref 32.0–36.0)
MCV: 96 fL (ref 79.3–98.0)
MONO#: 0.4 10*3/uL (ref 0.1–0.9)
MONO%: 7.6 % (ref 0.0–14.0)
NEUT#: 3.4 10*3/uL (ref 1.5–6.5)
NEUT%: 65.7 % (ref 39.0–75.0)
PLATELETS: 261 10*3/uL (ref 140–400)
RBC: 4.07 10*6/uL — AB (ref 4.20–5.82)
RDW: 12.6 % (ref 11.0–14.6)
WBC: 5.1 10*3/uL (ref 4.0–10.3)
lymph#: 1.2 10*3/uL (ref 0.9–3.3)

## 2017-02-26 LAB — COMPREHENSIVE METABOLIC PANEL
ALT: 10 U/L (ref 0–55)
ANION GAP: 8 meq/L (ref 3–11)
AST: 16 U/L (ref 5–34)
Albumin: 3.7 g/dL (ref 3.5–5.0)
Alkaline Phosphatase: 165 U/L — ABNORMAL HIGH (ref 40–150)
BILIRUBIN TOTAL: 0.45 mg/dL (ref 0.20–1.20)
BUN: 13.6 mg/dL (ref 7.0–26.0)
CALCIUM: 9.2 mg/dL (ref 8.4–10.4)
CHLORIDE: 97 meq/L — AB (ref 98–109)
CO2: 30 meq/L — AB (ref 22–29)
Creatinine: 0.9 mg/dL (ref 0.7–1.3)
EGFR: 79 mL/min/{1.73_m2} — AB (ref >90)
Glucose: 127 mg/dl (ref 70–140)
Potassium: 4 mEq/L (ref 3.5–5.1)
Sodium: 135 mEq/L — ABNORMAL LOW (ref 136–145)
TOTAL PROTEIN: 7 g/dL (ref 6.4–8.3)

## 2017-02-26 NOTE — Progress Notes (Signed)
Hematology and Oncology Follow Up Visit  Justin Porter [id number] [birth year] 83 y.o. 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 His initial diagnosis was in 1995.  Prior Therapy:  He is status post prostatectomy and subsequently developed biochemical relapse treated with salvage radiation therapy. He did develop presumed pulmonary metastasis status post surgical resection in 2003. The pathology confirmed the presence of adenocarcinoma from a prostate etiology. He has been on androgen depravation initially intermittently and subsequently continuously since 2013. He developed a rise in his PSA up to 31.56 with a doubling time less than 6 months in 05/2014 with bony metastasis.    Current therapy:  Supportive care only.  Interim History:   Justin Porter presents today for a followup visit with his wife. Since the last visit, he completed radiation therapy to the left hip with improvement in his overall status. He is ambulating with the help of a walker without any falls or syncope. His appetite has been reasonable and does not report any decline in his overall quality of life. He reports his swelling in his left leg of also decreased. He denied any cough, wheezing or early satiety. His performance status is limited but for the most part maintained.   He does not report any headaches, blurry vision, syncope or seizures. He does not report any constitutional symptoms of fevers or chills. He has not reported any chest pain or palpitation. He has not reported any cough or hemoptysis.  He does not report any frequency urgency or hesitancy or hematuria. He does not report any lymphadenopathy or petechiae. He does not report any skin rashes or lesions. He does not report any depression or side he does report hearing loss. Remainder of his review of systems unremarkable.       Medications: I have reviewed the patient's current medications.  Current Outpatient Prescriptions  Medication Sig Dispense Refill  . abiraterone Acetate (ZYTIGA) 250 MG tablet Take 4 tablets (1,000 mg total) by mouth daily. Take on an empty stomach 1 hour before or 2 hours after a meal 120 tablet 0  . calcium citrate-vitamin D (CITRACAL+D) 315-200 MG-UNIT per tablet Take 1 tablet by mouth every morning.     Marland Kitchen ELIQUIS 5 MG TABS tablet Take 5 mg by mouth 2 (two) times daily.  6  . furosemide (LASIX) 20 MG tablet Take 20 mg by mouth 2 (two) times daily.    Marland Kitchen HYDROcodone-acetaminophen (NORCO) 5-325 MG tablet Take 1-2 tablets by mouth every 6 (six) hours as needed for moderate pain. 60 tablet 0  . Multiple Vitamin (MULITIVITAMIN WITH MINERALS) TABS Take 1 tablet by mouth every morning.    . polyethylene glycol (MIRALAX / GLYCOLAX) packet Take 17 g by mouth 2 (two) times daily. 14 each 0  . Potassium 99 MG TABS Take 1 tablet by mouth every morning.    . simvastatin (ZOCOR) 20 MG tablet Take 20 mg by mouth every evening.      No current facility-administered medications for this visit.      Allergies:  Allergies  Allergen Reactions  . Codeine Nausea And Vomiting  . Shrimp [Shellfish Allergy] Nausea And Vomiting  . Tramadol Nausea And Vomiting  . Betadine [Povidone Iodine] Rash    Past Medical History, Surgical history, Social history, and Family History were reviewed and updated.   Physical Exam: Blood pressure (!) 144/52, pulse 82, temperature 98.7 F (37.1  C), temperature source Oral, resp. rate 17, height 6' (1.829 m), weight 183 lb 8 oz (83.2 kg), SpO2 97 %. ECOG: 1 General appearance: Elderly frail gentleman without distress. Head: Normocephalic, without obvious abnormality no oral thrush or ulcers. Neck: no adenopathy n Lymph nodes: Cervical, supraclavicular, and axillary nodes normal. Heart:regular rate and rhythm, S1, S2 normal, no murmur, click, rub or  gallop Lung:chest clear, no wheezing, rales, normal symmetric air entry  Abdomin: soft, non-tender, without masses or organomegaly no shifting dullness or ascites. EXT: Edema noted in the left leg more than the right. Neurological examination: No deficits noted.   Lab Results: Lab Results  Component Value Date   WBC 5.1 02/26/2017   HGB 13.1 02/26/2017   HCT 39.0 02/26/2017   MCV 96.0 02/26/2017   PLT 261 02/26/2017     Chemistry      Component Value Date/Time   NA 135 (L) 02/26/2017 1435   K 4.0 02/26/2017 1435   CL 102 05/23/2014 0423   CO2 30 (H) 02/26/2017 1435   BUN 13.6 02/26/2017 1435   CREATININE 0.9 02/26/2017 1435      Component Value Date/Time   CALCIUM 9.2 02/26/2017 1435   ALKPHOS 165 (H) 02/26/2017 1435   AST 16 02/26/2017 1435   ALT 10 02/26/2017 1435   BILITOT 0.45 02/26/2017 6819      83 year old gentleman with the following issues:   1. Castration resistant metastatic prostate cancer with disease to the bone. His initial diagnosis was in 1995 status post prostatectomy and subsequently developed biochemical relapse treated with salvage radiation therapy.  He developed lung recurrence in 2003 and underwent surgical resection and the pathology reviewed above. PSA was up to 31.5 with a doubling time of less then 6 months. CT scan of the chest on 06/11/2014 showed disease relapse with a PSA up to 31.   He was started on Xtandi in December 2015 With disease progression in 2018.  His disease has progressed with a rise in his PSA up to 118. Imaging studies showed metastatic disease in the left hemipelvis which she received radiation for.   Options of therapy were reviewed again with the patient. He is not able to get any insurance coverage for Zytiga and the cost is certainly prohibitive. The else are that he will not benefit from this medication given the aggressive nature of his cancer and the fact that he progressed on Xtandi. We have discussed the role for  systemic chemotherapy which she is a marginal candidate for and he declined.  We have opted to proceed with supportive care only at this time and he understands that his disease will likely progress further about require hospice in the future.  2. Weakness of the left leg: Related to a large tumor although there is no pain noted. Improved with radiation therapy..  3. Pulmonary nodules: CT scan obtained on 11/19/2016 shows stable pulmonary nodules.  4. Follow-up: Will be in next 3 months to follow his clinical status.   Zola Button, MD 8/10/20183:52 PM

## 2017-02-26 NOTE — Telephone Encounter (Signed)
Gave patient avs and calendar for future appts.

## 2017-02-28 LAB — PSA: PROSTATE SPECIFIC AG, SERUM: 117.7 ng/mL — AB (ref 0.0–4.0)

## 2017-03-02 DIAGNOSIS — M25552 Pain in left hip: Secondary | ICD-10-CM | POA: Diagnosis not present

## 2017-03-02 DIAGNOSIS — M6281 Muscle weakness (generalized): Secondary | ICD-10-CM | POA: Diagnosis not present

## 2017-03-02 DIAGNOSIS — R262 Difficulty in walking, not elsewhere classified: Secondary | ICD-10-CM | POA: Diagnosis not present

## 2017-03-09 ENCOUNTER — Ambulatory Visit
Admission: RE | Admit: 2017-03-09 | Discharge: 2017-03-09 | Disposition: A | Discharge status: Home / Self Care | Payer: Medicare Other | Source: Ambulatory Visit | Attending: Urology | Admitting: Urology

## 2017-03-09 DIAGNOSIS — C7951 Secondary malignant neoplasm of bone: Secondary | ICD-10-CM

## 2017-03-09 DIAGNOSIS — Z923 Personal history of irradiation: Secondary | ICD-10-CM | POA: Diagnosis not present

## 2017-03-09 DIAGNOSIS — C61 Malignant neoplasm of prostate: Secondary | ICD-10-CM | POA: Diagnosis not present

## 2017-03-09 DIAGNOSIS — M6281 Muscle weakness (generalized): Secondary | ICD-10-CM | POA: Diagnosis not present

## 2017-03-09 DIAGNOSIS — Z510 Encounter for antineoplastic radiation therapy: Secondary | ICD-10-CM | POA: Diagnosis not present

## 2017-03-09 DIAGNOSIS — R262 Difficulty in walking, not elsewhere classified: Secondary | ICD-10-CM | POA: Diagnosis not present

## 2017-03-09 DIAGNOSIS — M25552 Pain in left hip: Secondary | ICD-10-CM | POA: Diagnosis not present

## 2017-03-09 DIAGNOSIS — Z86718 Personal history of other venous thrombosis and embolism: Secondary | ICD-10-CM | POA: Diagnosis not present

## 2017-03-09 DIAGNOSIS — E785 Hyperlipidemia, unspecified: Secondary | ICD-10-CM | POA: Diagnosis not present

## 2017-03-09 MED ORDER — HYDROCODONE-ACETAMINOPHEN 5-325 MG PO TABS: 1.0000 | ORAL_TABLET | Freq: Four times a day (QID) | ORAL | 0 refills | Status: AC | PRN

## 2017-03-09 NOTE — Addendum Note (Signed)
Encounter addended by: Malena Edman, RN on: 03/09/2017  3:03 PM<BR>    Actions taken: Charge Capture section accepted

## 2017-03-09 NOTE — Progress Notes (Signed)
Radiation Oncology         [phone] ________________________________  Name: Justin Porter MRN: [id number]  Date: 03/09/2017  DOB: [birth year]  Post Treatment Note  CC: [name], MD  [name], MD  Diagnosis:  83 y.o. man with painful pubic ramus metastasis from metastatic prostate cancer  Interval Since Last Radiation:  6 weeks  01/11/2017 - 01/25/2017:   Palliative radiotherapy to the left hip was treated to 30 Gy in 10 fractions of 3 Gy.  05/04/2001 - 06/21/2001: received salvage radiotherapy to the prostatic fossa (63 Gy)  Narrative:  The patient returns today for routine follow-up.   He tolerated radiation treatment relatively well. He reported a normal appetite and no change in urinary or bowel patterns, but he experienced nocturia x5-6.  He denied diarrhea or pain but reported pelvic pain when he attempted to walk, so he used a walker and wheelchair.  Left LE edema was noted, but the patient reported no LE numbness or tingling.                              On review of systems, the patient states that he only has occasional twinges of pain in the left hip since completing treatment. He is quite pleased with his progress to date. He no longer requires narcotic pain medication. When the left hip/left lower leg pain occurs, it generally resolves with the use of an Aleve.  He has been working with PT to regain strength in the left LE and has recently graduated to use of a cane for ambulation.  He feels that he has made excellent progress in regaining his strength in the left lower extremity. He reports that he has good energy level and continues with a regular exercise routine daily at home. He continues with mild increased nocturia and daytime frequency but denies gross hematuria, dysuria, incomplete emptying or incontinence.  ALLERGIES:  is allergic to codeine; shrimp [shellfish allergy]; tramadol; and betadine [povidone iodine].  Meds: Current Outpatient Prescriptions    Medication Sig Dispense Refill  . ELIQUIS 5 MG TABS tablet Take 5 mg by mouth 2 (two) times daily.  6  . furosemide (LASIX) 20 MG tablet Take 20 mg by mouth 2 (two) times daily.    . Nutritional Supplements (ENSURE HIGH PROTEIN PO) Take by mouth 2 (two) times daily.    . polyethylene glycol (MIRALAX / GLYCOLAX) packet Take 17 g by mouth 2 (two) times daily. 14 each 0  . simvastatin (ZOCOR) 20 MG tablet Take 20 mg by mouth every evening.     Marland Kitchen abiraterone Acetate (ZYTIGA) 250 MG tablet Take 4 tablets (1,000 mg total) by mouth daily. Take on an empty stomach 1 hour before or 2 hours after a meal (Patient not taking: Reported on 03/09/2017) 120 tablet 0  . calcium citrate-vitamin D (CITRACAL+D) 315-200 MG-UNIT per tablet Take 1 tablet by mouth every morning.     Marland Kitchen HYDROcodone-acetaminophen (NORCO) 5-325 MG tablet Take 1-2 tablets by mouth every 6 (six) hours as needed for moderate pain. 60 tablet 0  . Multiple Vitamin (MULITIVITAMIN WITH MINERALS) TABS Take 1 tablet by mouth every morning.    . Potassium 99 MG TABS Take 1 tablet by mouth every morning.     No current facility-administered medications for this encounter.     Physical Findings:  vitals were not taken for this visit. In general this is a well appearing Caucasian male in  no acute distress. He's alert and oriented x4 and appropriate throughout the examination. Cardiopulmonary assessment is negative for acute distress and he exhibits normal effort.   Lab Findings: Lab Results  Component Value Date   WBC 5.1 02/26/2017   HGB 13.1 02/26/2017   HCT 39.0 02/26/2017   MCV 96.0 02/26/2017   PLT 261 02/26/2017     Radiographic Findings: No results found.  Impression/Plan: 45. 83 y.o. 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 He appears to have recovered well from the effects of radiotherapy.  He has noticed significant improvement in his pain in the left hip and left leg. He is no longer requiring  narcotic pain medication. He continues to work with physical therapy to regain strength in the left lower extremity. He had a recent follow-up with Dr. Alen Blew on 02/26/2017 and the decision at that time was to proceed with supportive care only. I expressed that we are happy to continue to participate in his care if clinically indicated but at this time we'll plan for follow-up as needed. He will continue with disease management under the care and direction of Dr. Alen Blew.  His next scheduled follow-up with Dr. Alen Blew is in November 2018.  He is comfortable;e with the current plan. He knows to call at any time with any questions or concerns related to his previous radiotherapy.    Nicholos Johns, PA-C

## 2017-03-17 DIAGNOSIS — R262 Difficulty in walking, not elsewhere classified: Secondary | ICD-10-CM | POA: Diagnosis not present

## 2017-03-17 DIAGNOSIS — Z23 Encounter for immunization: Secondary | ICD-10-CM | POA: Diagnosis not present

## 2017-03-17 DIAGNOSIS — Z1389 Encounter for screening for other disorder: Secondary | ICD-10-CM | POA: Diagnosis not present

## 2017-03-17 DIAGNOSIS — M25552 Pain in left hip: Secondary | ICD-10-CM | POA: Diagnosis not present

## 2017-03-17 DIAGNOSIS — Z8546 Personal history of malignant neoplasm of prostate: Secondary | ICD-10-CM | POA: Diagnosis not present

## 2017-03-17 DIAGNOSIS — Z0000 Encounter for general adult medical examination without abnormal findings: Secondary | ICD-10-CM | POA: Diagnosis not present

## 2017-03-17 DIAGNOSIS — R7309 Other abnormal glucose: Secondary | ICD-10-CM | POA: Diagnosis not present

## 2017-03-17 DIAGNOSIS — M159 Polyosteoarthritis, unspecified: Secondary | ICD-10-CM | POA: Diagnosis not present

## 2017-03-17 DIAGNOSIS — M6281 Muscle weakness (generalized): Secondary | ICD-10-CM | POA: Diagnosis not present

## 2017-03-17 DIAGNOSIS — E785 Hyperlipidemia, unspecified: Secondary | ICD-10-CM | POA: Diagnosis not present

## 2017-03-17 DIAGNOSIS — I10 Essential (primary) hypertension: Secondary | ICD-10-CM | POA: Diagnosis not present

## 2017-03-17 DIAGNOSIS — I82402 Acute embolism and thrombosis of unspecified deep veins of left lower extremity: Secondary | ICD-10-CM | POA: Diagnosis not present

## 2017-03-17 DIAGNOSIS — R269 Unspecified abnormalities of gait and mobility: Secondary | ICD-10-CM | POA: Diagnosis not present

## 2017-03-17 DIAGNOSIS — I872 Venous insufficiency (chronic) (peripheral): Secondary | ICD-10-CM | POA: Diagnosis not present

## 2017-03-24 DIAGNOSIS — R262 Difficulty in walking, not elsewhere classified: Secondary | ICD-10-CM | POA: Diagnosis not present

## 2017-03-24 DIAGNOSIS — M6281 Muscle weakness (generalized): Secondary | ICD-10-CM | POA: Diagnosis not present

## 2017-03-24 DIAGNOSIS — M25552 Pain in left hip: Secondary | ICD-10-CM | POA: Diagnosis not present

## 2017-03-31 DIAGNOSIS — R262 Difficulty in walking, not elsewhere classified: Secondary | ICD-10-CM | POA: Diagnosis not present

## 2017-03-31 DIAGNOSIS — M6281 Muscle weakness (generalized): Secondary | ICD-10-CM | POA: Diagnosis not present

## 2017-03-31 DIAGNOSIS — M25552 Pain in left hip: Secondary | ICD-10-CM | POA: Diagnosis not present

## 2017-04-07 DIAGNOSIS — M6281 Muscle weakness (generalized): Secondary | ICD-10-CM | POA: Diagnosis not present

## 2017-04-07 DIAGNOSIS — R262 Difficulty in walking, not elsewhere classified: Secondary | ICD-10-CM | POA: Diagnosis not present

## 2017-04-07 DIAGNOSIS — M25552 Pain in left hip: Secondary | ICD-10-CM | POA: Diagnosis not present

## 2017-04-14 DIAGNOSIS — M6281 Muscle weakness (generalized): Secondary | ICD-10-CM | POA: Diagnosis not present

## 2017-04-14 DIAGNOSIS — R262 Difficulty in walking, not elsewhere classified: Secondary | ICD-10-CM | POA: Diagnosis not present

## 2017-04-14 DIAGNOSIS — M25552 Pain in left hip: Secondary | ICD-10-CM | POA: Diagnosis not present

## 2017-04-19 DIAGNOSIS — H26492 Other secondary cataract, left eye: Secondary | ICD-10-CM | POA: Diagnosis not present

## 2017-04-22 DIAGNOSIS — M6281 Muscle weakness (generalized): Secondary | ICD-10-CM | POA: Diagnosis not present

## 2017-04-22 DIAGNOSIS — R262 Difficulty in walking, not elsewhere classified: Secondary | ICD-10-CM | POA: Diagnosis not present

## 2017-04-22 DIAGNOSIS — M25552 Pain in left hip: Secondary | ICD-10-CM | POA: Diagnosis not present

## 2017-04-29 DIAGNOSIS — M6281 Muscle weakness (generalized): Secondary | ICD-10-CM | POA: Diagnosis not present

## 2017-04-29 DIAGNOSIS — M25552 Pain in left hip: Secondary | ICD-10-CM | POA: Diagnosis not present

## 2017-04-29 DIAGNOSIS — R262 Difficulty in walking, not elsewhere classified: Secondary | ICD-10-CM | POA: Diagnosis not present

## 2017-05-04 DIAGNOSIS — H26491 Other secondary cataract, right eye: Secondary | ICD-10-CM | POA: Diagnosis not present

## 2017-05-05 ENCOUNTER — Other Ambulatory Visit: Payer: Self-pay | Admitting: Internal Medicine

## 2017-05-05 DIAGNOSIS — R1310 Dysphagia, unspecified: Secondary | ICD-10-CM

## 2017-05-11 ENCOUNTER — Ambulatory Visit
Admission: RE | Admit: 2017-05-11 | Discharge: 2017-05-11 | Disposition: A | Discharge status: Home / Self Care | Payer: Medicare Other | Source: Ambulatory Visit | Attending: Internal Medicine | Admitting: Internal Medicine

## 2017-05-11 DIAGNOSIS — R1310 Dysphagia, unspecified: Secondary | ICD-10-CM | POA: Diagnosis not present

## 2017-05-13 DIAGNOSIS — M6281 Muscle weakness (generalized): Secondary | ICD-10-CM | POA: Diagnosis not present

## 2017-05-13 DIAGNOSIS — M25552 Pain in left hip: Secondary | ICD-10-CM | POA: Diagnosis not present

## 2017-05-13 DIAGNOSIS — R262 Difficulty in walking, not elsewhere classified: Secondary | ICD-10-CM | POA: Diagnosis not present

## 2017-05-27 ENCOUNTER — Telehealth: Payer: Self-pay | Admitting: Oncology

## 2017-05-27 ENCOUNTER — Ambulatory Visit (HOSPITAL_BASED_OUTPATIENT_CLINIC_OR_DEPARTMENT_OTHER): Payer: Medicare Other | Admitting: Oncology

## 2017-05-27 ENCOUNTER — Other Ambulatory Visit (HOSPITAL_BASED_OUTPATIENT_CLINIC_OR_DEPARTMENT_OTHER): Payer: Medicare Other

## 2017-05-27 VITALS — BP 159/80 | HR 73 | Temp 98.6°F | Resp 18 | Ht 72.0 in | Wt 183.7 lb

## 2017-05-27 DIAGNOSIS — Z923 Personal history of irradiation: Secondary | ICD-10-CM | POA: Diagnosis not present

## 2017-05-27 DIAGNOSIS — C61 Malignant neoplasm of prostate: Secondary | ICD-10-CM

## 2017-05-27 DIAGNOSIS — C7800 Secondary malignant neoplasm of unspecified lung: Secondary | ICD-10-CM

## 2017-05-27 DIAGNOSIS — C7951 Secondary malignant neoplasm of bone: Secondary | ICD-10-CM

## 2017-05-27 LAB — CBC WITH DIFFERENTIAL/PLATELET
BASO%: 1 % (ref 0.0–2.0)
BASOS ABS: 0.1 10*3/uL (ref 0.0–0.1)
EOS ABS: 0.1 10*3/uL (ref 0.0–0.5)
EOS%: 1.7 % (ref 0.0–7.0)
HEMATOCRIT: 36 % — AB (ref 38.4–49.9)
HGB: 12 g/dL — ABNORMAL LOW (ref 13.0–17.1)
LYMPH%: 20.1 % (ref 14.0–49.0)
MCH: 31.4 pg (ref 27.2–33.4)
MCHC: 33.4 g/dL (ref 32.0–36.0)
MCV: 94 fL (ref 79.3–98.0)
MONO#: 0.5 10*3/uL (ref 0.1–0.9)
MONO%: 8.9 % (ref 0.0–14.0)
NEUT#: 3.9 10*3/uL (ref 1.5–6.5)
NEUT%: 68.3 % (ref 39.0–75.0)
Platelets: 230 10*3/uL (ref 140–400)
RBC: 3.83 10*6/uL — ABNORMAL LOW (ref 4.20–5.82)
RDW: 13.4 % (ref 11.0–14.6)
WBC: 5.7 10*3/uL (ref 4.0–10.3)
lymph#: 1.1 10*3/uL (ref 0.9–3.3)

## 2017-05-27 LAB — COMPREHENSIVE METABOLIC PANEL
ALT: 13 U/L (ref 0–55)
AST: 24 U/L (ref 5–34)
Albumin: 3.9 g/dL (ref 3.5–5.0)
Alkaline Phosphatase: 272 U/L — ABNORMAL HIGH (ref 40–150)
Anion Gap: 8 mEq/L (ref 3–11)
BILIRUBIN TOTAL: 0.43 mg/dL (ref 0.20–1.20)
BUN: 13.9 mg/dL (ref 7.0–26.0)
CHLORIDE: 98 meq/L (ref 98–109)
CO2: 29 meq/L (ref 22–29)
CREATININE: 0.9 mg/dL (ref 0.7–1.3)
Calcium: 8.9 mg/dL (ref 8.4–10.4)
EGFR: >60 mL/min/{1.73_m2} (ref >60)
GLUCOSE: 95 mg/dL (ref 70–140)
Potassium: 4.8 mEq/L (ref 3.5–5.1)
SODIUM: 135 meq/L — AB (ref 136–145)
TOTAL PROTEIN: 7.1 g/dL (ref 6.4–8.3)

## 2017-05-27 NOTE — Telephone Encounter (Signed)
Gave avs and calendar for March 2019 °

## 2017-05-27 NOTE — Progress Notes (Signed)
Hematology and Oncology Follow Up Visit  Justin Porter [id number] 1929/07/09 83 y.o. 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 His initial diagnosis was in 1995.  Prior Therapy:  He is status post prostatectomy and subsequently developed biochemical relapse treated with salvage radiation therapy. He did develop presumed pulmonary metastasis status post surgical resection in 2003. The pathology confirmed the presence of adenocarcinoma from a prostate etiology. He has been on androgen depravation initially intermittently and subsequently continuously since 2013. He developed a rise in his PSA up to 31.56 with a doubling time less than 6 months in 05/2014 with bony metastasis.    Current therapy:  Supportive care only.  Interim History:   Mr. Justin Porter presents today for a followup visit with his wife. Since the last visit, he reports continuous improvement in his overall health.  He finished physical therapy and have regained a lot of strength in his left lower extremity.  He is ambulating with the help of a cane and no longer requires a wheelchair.  He still have some residual stiffness and edema which prohibitive for him to bend down completely.  There is overall quality of life and performance status is improving.  His appetite has been reasonable and does not report any decline in his overall quality of life. He denied any cough, wheezing or early satiety.  He denies any other respiratory complaints.   He does not report any headaches, blurry vision, syncope or seizures. He does not report any constitutional symptoms of fevers or chills. He has not reported any chest pain or palpitation. He has not reported any cough or hemoptysis.  He does not report any frequency urgency or hesitancy or hematuria. He does not report any lymphadenopathy or petechiae. He does  not report any skin rashes or lesions. He does not report any depression or side he does report hearing loss. Remainder of his review of systems unremarkable.      Medications: I have reviewed the patient's current medications.  Current Outpatient Medications  Medication Sig Dispense Refill  . calcium citrate-vitamin D (CITRACAL+D) 315-200 MG-UNIT per tablet Take 1 tablet by mouth every morning.     Marland Kitchen ELIQUIS 5 MG TABS tablet Take 5 mg by mouth 2 (two) times daily.  6  . furosemide (LASIX) 20 MG tablet Take 20 mg by mouth 2 (two) times daily.    Marland Kitchen HYDROcodone-acetaminophen (NORCO) 5-325 MG tablet Take 1-2 tablets by mouth every 6 (six) hours as needed for moderate pain. 60 tablet 0  . Multiple Vitamin (MULITIVITAMIN WITH MINERALS) TABS Take 1 tablet by mouth every morning.    . Nutritional Supplements (ENSURE HIGH PROTEIN PO) Take by mouth 2 (two) times daily.    . polyethylene glycol (MIRALAX / GLYCOLAX) packet Take 17 g by mouth 2 (two) times daily. 14 each 0  . Potassium 99 MG TABS Take 1 tablet by mouth every morning.    . simvastatin (ZOCOR) 20 MG tablet Take 20 mg by mouth every evening.     Marland Kitchen abiraterone Acetate (ZYTIGA) 250 MG tablet Take 4 tablets (1,000 mg total) by mouth daily. Take on an empty stomach 1 hour before or 2 hours after a meal (Patient not taking: Reported on 03/09/2017) 120 tablet 0   No current facility-administered medications for this visit.      Allergies:  Allergies  Allergen Reactions  . Codeine Nausea And Vomiting  .  Shrimp [Shellfish Allergy] Nausea And Vomiting  . Tramadol Nausea And Vomiting  . Betadine [Povidone Iodine] Rash    Past Medical History, Surgical history, Social history, and Family History were reviewed and updated.   Physical Exam: Blood pressure (!) 159/80, pulse 73, temperature 98.6 F (37 C), temperature source Oral, resp. rate 18, height 6' (1.829 m), weight 183 lb 11.2 oz (83.3 kg), SpO2 100 %. ECOG: 1 General appearance:  Alert, awake gentleman appeared without distress.. Head: Normocephalic, without obvious abnormality no oral ulcers or lesions. Neck: no adenopathy no masses. Lymph nodes: Cervical, supraclavicular, and axillary nodes normal. Heart:regular rate and rhythm, S1, S2 normal, no murmur, click, rub or gallop Lung:chest clear, no wheezing, rales, normal symmetric air entry  Abdomin: soft, non-tender, without masses or organomegaly no rebound or guarding. EXT: Edema noted in the left leg more than the right. Neurological examination: No deficits noted.   Lab Results: Lab Results  Component Value Date   WBC 5.7 05/27/2017   HGB 12.0 (L) 05/27/2017   HCT 36.0 (L) 05/27/2017   MCV 94.0 05/27/2017   PLT 230 05/27/2017     Chemistry      Component Value Date/Time   NA 135 (L) 02/26/2017 1435   K 4.0 02/26/2017 1435   CL 102 05/23/2014 0423   CO2 30 (H) 02/26/2017 1435   BUN 13.6 02/26/2017 1435   CREATININE 0.9 02/26/2017 1435      Component Value Date/Time   CALCIUM 9.2 02/26/2017 1435   ALKPHOS 165 (H) 02/26/2017 1435   AST 16 02/26/2017 1435   ALT 10 02/26/2017 1435   BILITOT 0.45 02/26/2017 4050      83 year old gentleman with the following issues:   1. Castration resistant metastatic prostate cancer with disease to the bone. His initial diagnosis was in 1995 status post prostatectomy and subsequently developed biochemical relapse treated with salvage radiation therapy.  He developed lung recurrence in 2003 and underwent surgical resection and the pathology reviewed above. PSA was up to 31.5 with a doubling time of less then 6 months. CT scan of the chest on 06/11/2014 showed disease relapse with a PSA up to 31.   He was started on Xtandi in December 2015 With disease progression in 2018.  His disease has progressed with a rise in his PSA up to 118. Imaging studies showed metastatic disease in the left hemipelvis which he received radiation for.   Options of therapy were reviewed  again with the patient.  Supportive care versus systemic chemotherapy were reviewed today.  Given his overall health that is reasonably adequate at this time he would be a marginal candidate for systemic chemotherapy.  After discussion, we have elected to proceed with supportive care only and address symptoms as they arise.  2. Weakness of the left leg: Improved after radiation therapy and physical therapy..  3. Pulmonary nodules: CT scan obtained on 11/19/2016 shows stable pulmonary nodules.  4. Follow-up: Will be in next 4 months to follow his clinical status.   Zola Button, MD 11/8/20183:20 PM

## 2017-05-28 ENCOUNTER — Telehealth: Payer: Self-pay | Admitting: *Deleted

## 2017-05-28 LAB — PSA: Prostate Specific Ag, Serum: 105.5 ng/mL — ABNORMAL HIGH (ref 0.0–4.0)

## 2017-05-28 NOTE — Telephone Encounter (Signed)
-----   Message from Wyatt Portela, MD sent at 05/28/2017  8:52 AM EST ----- Please let him know his PSA is down.

## 2017-05-28 NOTE — Telephone Encounter (Signed)
LM on answerinig machine, results of last PSA

## 2017-06-15 DIAGNOSIS — H02105 Unspecified ectropion of left lower eyelid: Secondary | ICD-10-CM | POA: Diagnosis not present

## 2017-06-15 DIAGNOSIS — H1189 Other specified disorders of conjunctiva: Secondary | ICD-10-CM | POA: Diagnosis not present

## 2017-06-15 DIAGNOSIS — H01006 Unspecified blepharitis left eye, unspecified eyelid: Secondary | ICD-10-CM | POA: Diagnosis not present

## 2017-06-23 DIAGNOSIS — M79652 Pain in left thigh: Secondary | ICD-10-CM | POA: Diagnosis not present

## 2017-07-06 DIAGNOSIS — H1189 Other specified disorders of conjunctiva: Secondary | ICD-10-CM | POA: Diagnosis not present

## 2017-07-06 DIAGNOSIS — H40052 Ocular hypertension, left eye: Secondary | ICD-10-CM | POA: Diagnosis not present

## 2017-07-06 DIAGNOSIS — H02105 Unspecified ectropion of left lower eyelid: Secondary | ICD-10-CM | POA: Diagnosis not present

## 2017-07-06 DIAGNOSIS — H01006 Unspecified blepharitis left eye, unspecified eyelid: Secondary | ICD-10-CM | POA: Diagnosis not present

## 2017-07-28 DIAGNOSIS — I82402 Acute embolism and thrombosis of unspecified deep veins of left lower extremity: Secondary | ICD-10-CM | POA: Diagnosis not present

## 2017-07-28 DIAGNOSIS — M79606 Pain in leg, unspecified: Secondary | ICD-10-CM | POA: Diagnosis not present

## 2017-07-28 DIAGNOSIS — C61 Malignant neoplasm of prostate: Secondary | ICD-10-CM | POA: Diagnosis not present

## 2017-07-28 DIAGNOSIS — I10 Essential (primary) hypertension: Secondary | ICD-10-CM | POA: Diagnosis not present

## 2017-07-28 DIAGNOSIS — Z1389 Encounter for screening for other disorder: Secondary | ICD-10-CM | POA: Diagnosis not present

## 2017-07-28 DIAGNOSIS — E785 Hyperlipidemia, unspecified: Secondary | ICD-10-CM | POA: Diagnosis not present

## 2017-07-28 DIAGNOSIS — I872 Venous insufficiency (chronic) (peripheral): Secondary | ICD-10-CM | POA: Diagnosis not present

## 2017-08-05 DIAGNOSIS — L814 Other melanin hyperpigmentation: Secondary | ICD-10-CM | POA: Diagnosis not present

## 2017-08-05 DIAGNOSIS — D2271 Melanocytic nevi of right lower limb, including hip: Secondary | ICD-10-CM | POA: Diagnosis not present

## 2017-08-05 DIAGNOSIS — L570 Actinic keratosis: Secondary | ICD-10-CM | POA: Diagnosis not present

## 2017-08-05 DIAGNOSIS — D1801 Hemangioma of skin and subcutaneous tissue: Secondary | ICD-10-CM | POA: Diagnosis not present

## 2017-08-05 DIAGNOSIS — L821 Other seborrheic keratosis: Secondary | ICD-10-CM | POA: Diagnosis not present

## 2017-08-05 DIAGNOSIS — I872 Venous insufficiency (chronic) (peripheral): Secondary | ICD-10-CM | POA: Diagnosis not present

## 2017-08-05 DIAGNOSIS — D692 Other nonthrombocytopenic purpura: Secondary | ICD-10-CM | POA: Diagnosis not present

## 2017-08-05 DIAGNOSIS — I8311 Varicose veins of right lower extremity with inflammation: Secondary | ICD-10-CM | POA: Diagnosis not present

## 2017-08-05 DIAGNOSIS — D225 Melanocytic nevi of trunk: Secondary | ICD-10-CM | POA: Diagnosis not present

## 2017-08-05 DIAGNOSIS — I8312 Varicose veins of left lower extremity with inflammation: Secondary | ICD-10-CM | POA: Diagnosis not present

## 2017-08-05 DIAGNOSIS — Z85828 Personal history of other malignant neoplasm of skin: Secondary | ICD-10-CM | POA: Diagnosis not present

## 2017-08-20 DIAGNOSIS — R319 Hematuria, unspecified: Secondary | ICD-10-CM | POA: Diagnosis not present

## 2017-09-22 ENCOUNTER — Telehealth: Payer: Self-pay

## 2017-09-22 NOTE — Telephone Encounter (Signed)
Spoke with patient wife, to verify appointment on 3/7. Per 3/6 phone message return.

## 2017-09-23 ENCOUNTER — Inpatient Hospital Stay: Payer: Medicare Other | Attending: Oncology | Admitting: Oncology

## 2017-09-23 ENCOUNTER — Telehealth: Payer: Self-pay | Admitting: Oncology

## 2017-09-23 VITALS — BP 139/67 | HR 79 | Temp 97.7°F | Resp 20 | Ht 72.0 in | Wt 183.9 lb

## 2017-09-23 DIAGNOSIS — R609 Edema, unspecified: Secondary | ICD-10-CM | POA: Insufficient documentation

## 2017-09-23 DIAGNOSIS — C7951 Secondary malignant neoplasm of bone: Secondary | ICD-10-CM | POA: Diagnosis not present

## 2017-09-23 DIAGNOSIS — C61 Malignant neoplasm of prostate: Secondary | ICD-10-CM | POA: Diagnosis not present

## 2017-09-23 NOTE — Progress Notes (Signed)
Hematology and Oncology Follow Up Visit  Justin Porter [id number] [birth year] 84 y.o. 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 He was initially diagnosed in 1995 but developed advanced disease in 2003.  Prior Therapy:  He is status post prostatectomy in 1995.  He developed biochemical relapse treated with salvage radiation therapy.  He did develop pulmonary metastasis status post surgical resection in 2003. The pathology confirmed the presence of adenocarcinoma from a prostate etiology. He has been on androgen since 2013.  He developed castration resistant disease in 2015 and was treated with Xtandi till 2018 where he developed disease progression. Is status post palliative radiation therapy to the left hip between June and July 2018.  Current therapy:  Supportive care only.  Interim History:   Justin Porter is here for a follow-up. He is reporting further declined since the last visit. He is reporting more left leg swelling and decrease in his mobility. He has very little to no pain noted and he does use hydrocodone intermittently. His left leg swelling has increased only the last few weeks after his Elquis has been decreased by 50%. He did develop hematuria in February 2019 that resulted in decreasing the dose of his anticoagulation. He is still ambulating with the help of a walker without any falls or syncope. His quality of life is not dramatically changed at this time.  He does not report any headaches, blurry vision, syncope or seizures. He does not report any constitutional symptoms of fevers or chills. He has not reported any chest pain or palpitation. He has not reported any cough or hemoptysis. Does not report any nausea, vomiting or diarrhea. He does not report any frequency urgency or hesitancy or hematuria. He does not report any  lymphadenopathy or petechiae. He does not report any skin rashes or lesions.  Remainder of his review of systems is negative.      Medications: I have reviewed the patient's current medications.  Current Outpatient Medications  Medication Sig Dispense Refill  . abiraterone Acetate (ZYTIGA) 250 MG tablet Take 4 tablets (1,000 mg total) by mouth daily. Take on an empty stomach 1 hour before or 2 hours after a meal (Patient not taking: Reported on 03/09/2017) 120 tablet 0  . calcium citrate-vitamin D (CITRACAL+D) 315-200 MG-UNIT per tablet Take 1 tablet by mouth every morning.     Marland Kitchen ELIQUIS 5 MG TABS tablet Take 5 mg by mouth 2 (two) times daily.  6  . furosemide (LASIX) 20 MG tablet Take 20 mg by mouth 2 (two) times daily.    Marland Kitchen HYDROcodone-acetaminophen (NORCO) 5-325 MG tablet Take 1-2 tablets by mouth every 6 (six) hours as needed for moderate pain. 60 tablet 0  . Multiple Vitamin (MULITIVITAMIN WITH MINERALS) TABS Take 1 tablet by mouth every morning.    . Nutritional Supplements (ENSURE HIGH PROTEIN PO) Take by mouth 2 (two) times daily.    . polyethylene glycol (MIRALAX / GLYCOLAX) packet Take 17 g by mouth 2 (two) times daily. 14 each 0  . Potassium 99 MG TABS Take 1 tablet by mouth every morning.    . simvastatin (ZOCOR) 20 MG tablet Take 20 mg by mouth every evening.      No current facility-administered medications for this visit.      Allergies:  Allergies  Allergen Reactions  . Codeine Nausea And Vomiting  . Shrimp [  Shellfish Allergy] Nausea And Vomiting  . Tramadol Nausea And Vomiting  . Betadine [Povidone Iodine] Rash    Past Medical History, Surgical history, Social history, and Family History were reviewed and updated.   Physical Exam: Blood pressure 139/67, pulse 79, temperature 97.7 F (36.5 C), temperature source Oral, resp. rate 20, height 6' (1.829 m), weight 183 lb 14.4 oz (83.4 kg), SpO2 98 %.   ECOG: 1 General appearance: Well appearing elderly gentleman  without distress. Head: Normocephalic, without obvious abnormality  Oropharynx: no oral ulcers or lesions. Eyes: No scleral icterus. Lymph nodes: Cervical, supraclavicular, and axillary nodes normal. Heart:regular rate and rhythm, S1, S2 normal, no murmur, click, rub or gallop Lung: Clear to auscultation without rhonchi, wheezes or dullness to percussion. Abdomin: Soft, nontender without any rebound or guarding. Musculoskeletal: Limited range of motion in his left hip due to pain. Edema noted. Neurological examination: No motor, sensory deficits. Skin: No rashes or lesions.  Lab Results: Lab Results  Component Value Date   WBC 5.7 05/27/2017   HGB 12.0 (L) 05/27/2017   HCT 36.0 (L) 05/27/2017   MCV 94.0 05/27/2017   PLT 230 05/27/2017     Chemistry      Component Value Date/Time   NA 135 (L) 05/27/2017 1440   K 4.8 05/27/2017 1440   CL 102 05/23/2014 0423   CO2 29 05/27/2017 1440   BUN 13.9 05/27/2017 1440   CREATININE 0.9 05/27/2017 1440      Component Value Date/Time   CALCIUM 8.9 05/27/2017 1440   ALKPHOS 272 (H) 05/27/2017 1440   AST 24 05/27/2017 1440   ALT 13 05/27/2017 1440   BILITOT 0.43 05/27/2017 6156      84 year old gentleman with the following issues:   1. Advanced prostate cancer with disease to the bone. He has developed castration resistant disease that has progressed Xtandi in 2018.  He has been on supportive care since that time has he is not a candidate for systemic chemotherapy. He did receive palliative radiation therapy to the left hip which have helped his symptoms.   He is not a candidate for any further anticancer care and supportive management is recommended at this time. He is thriving overall without any anticancer therapy and chemotherapy would likely affect his quality of life negatively. I recommended continue supportive management for the time being.  A trial of Zytiga will likely be beneficial at this time.   2. Leg edema: I have  recommended using compression stockings and elevation to alleviate the swelling. This is related likely to malignancy and he is already on anticoagulation. I do not believe that further radiation can be done or be helpful.   3. Prognosis: Poor with limited life expectancy due to advanced malignancy. His performance status remains adequate without any further decline. Hospice may be needed in the future if he develops Caryl Comes.   4. Follow-up: Will be in 3-4 months to follow his progress.   15  minutes was spent with the patient face-to-face today.  More than 50% of time was dedicated to patient counseling, education and answering questions regarding his prognosis and plans of care.  Zola Button, MD 3/7/20198:48 AM

## 2017-09-23 NOTE — Telephone Encounter (Signed)
Appointments scheduled AVS/Calendar printed per 3/7 los °

## 2017-10-01 ENCOUNTER — Ambulatory Visit: Payer: Medicare Other | Admitting: Oncology

## 2017-10-06 ENCOUNTER — Other Ambulatory Visit: Payer: Self-pay

## 2017-10-06 ENCOUNTER — Emergency Department (HOSPITAL_COMMUNITY)
Admission: EM | Admit: 2017-10-06 | Discharge: 2017-10-06 | Disposition: A | Discharge status: Home / Self Care | Payer: Medicare Other | Attending: Emergency Medicine | Admitting: Emergency Medicine

## 2017-10-06 ENCOUNTER — Encounter (HOSPITAL_COMMUNITY): Payer: Self-pay | Admitting: Emergency Medicine

## 2017-10-06 ENCOUNTER — Emergency Department (HOSPITAL_COMMUNITY): Payer: Medicare Other

## 2017-10-06 DIAGNOSIS — Z79899 Other long term (current) drug therapy: Secondary | ICD-10-CM | POA: Insufficient documentation

## 2017-10-06 DIAGNOSIS — S0083XA Contusion of other part of head, initial encounter: Secondary | ICD-10-CM | POA: Diagnosis not present

## 2017-10-06 DIAGNOSIS — Y939 Activity, unspecified: Secondary | ICD-10-CM | POA: Insufficient documentation

## 2017-10-06 DIAGNOSIS — M25561 Pain in right knee: Secondary | ICD-10-CM | POA: Insufficient documentation

## 2017-10-06 DIAGNOSIS — Y929 Unspecified place or not applicable: Secondary | ICD-10-CM | POA: Diagnosis not present

## 2017-10-06 DIAGNOSIS — W1830XA Fall on same level, unspecified, initial encounter: Secondary | ICD-10-CM | POA: Insufficient documentation

## 2017-10-06 DIAGNOSIS — R51 Headache: Secondary | ICD-10-CM | POA: Diagnosis not present

## 2017-10-06 DIAGNOSIS — C7802 Secondary malignant neoplasm of left lung: Secondary | ICD-10-CM | POA: Diagnosis not present

## 2017-10-06 DIAGNOSIS — S1982XA Other specified injuries of cervical trachea, initial encounter: Secondary | ICD-10-CM | POA: Diagnosis not present

## 2017-10-06 DIAGNOSIS — Z8546 Personal history of malignant neoplasm of prostate: Secondary | ICD-10-CM | POA: Insufficient documentation

## 2017-10-06 DIAGNOSIS — Z7901 Long term (current) use of anticoagulants: Secondary | ICD-10-CM | POA: Diagnosis not present

## 2017-10-06 DIAGNOSIS — S8002XA Contusion of left knee, initial encounter: Secondary | ICD-10-CM | POA: Diagnosis not present

## 2017-10-06 DIAGNOSIS — S0990XA Unspecified injury of head, initial encounter: Secondary | ICD-10-CM | POA: Diagnosis not present

## 2017-10-06 DIAGNOSIS — M25562 Pain in left knee: Secondary | ICD-10-CM | POA: Insufficient documentation

## 2017-10-06 DIAGNOSIS — I10 Essential (primary) hypertension: Secondary | ICD-10-CM | POA: Diagnosis not present

## 2017-10-06 DIAGNOSIS — Y999 Unspecified external cause status: Secondary | ICD-10-CM | POA: Diagnosis not present

## 2017-10-06 DIAGNOSIS — G8911 Acute pain due to trauma: Secondary | ICD-10-CM | POA: Diagnosis not present

## 2017-10-06 NOTE — Care Management Note (Addendum)
Case Management Note  Patient Details  Name: Justin Porter MRN: [id number] Date of Birth: 1928/12/22  CM consulted for [facility] and DME needs.  CM spoke with pt, spouse, and their neighbor at bedside.  Pt did not participate with most of the conversation.  Pt's wife reports she is "managing right now." She has been looking at SNF facilities and has a Biochemist, clinical which she has been told is "really good."  CM and spouse spoke about being able to pay privately for SNF facility but still being able to get therapy services which ins would pay for and if he improved she could bring him home.  CM advised that the [facility] team could work with his PCP for this in the future.  Pt's wife reports he has never had a wheelchair and that a hospital bed would be helpful.  She reports she has room for this bed. CM and wife discussed the pt's safety with getting to his PCP appointments and she was interested in providers that could come to the home this becomes and increasing issue.  Information placed on AVS. CM updated Dr. Zenia Resides.  Orders placed.  CM contacted Santiago Glad with [facility] who will bring the wheelchair down to the spouse for transport home and discuss the hospital bed.  Pt will transport home via [facility].  Updated primary RN.  No further CM needs noted at this time.  Expected Discharge Date:    10/06/2017              Expected Discharge Plan:  [place]  Discharge planning Services  CM Consult  Post Acute Care Choice:  Home Health, Durable Medical Equipment Choice offered to:  Patient, Spouse  DME Arranged:  Hospital bed, Wheelchair manual DME Agency:  [place]:  RN, PT, OT, Nurse's Aide, Speech Therapy, Social Work [employer] Agency:  [place]  Status of Service:  Completed, signed off      Museum/gallery conservator  (From admission, onward)        Start     Ordered   10/06/17 1044  For home use only DME Hospital bed  Once    Question Answer Comment   The above medical condition requires: Patient requires the ability to reposition frequently   Bed type Semi-electric      10/06/17 1044   10/06/17 1035  For home use only DME standard manual wheelchair with seat cushion  Once    Comments:  Patient suffers from dementia which impairs their ability to perform daily activities like toileting in the home.  A walker will not resolve  issue with performing activities of daily living. A wheelchair will allow patient to safely perform daily activities. Patient can safely propel the wheelchair in the home or has a caregiver who can provide assistance.  Accessories: elevating leg rests (ELRs), wheel locks, extensions and anti-tippers.   10/06/17 1034     Shardae Kleinman, Benjaman Lobe, RN 10/06/2017, 10:41 AM

## 2017-10-06 NOTE — Care Management Note (Signed)
Case Management Note  Pts wife updated CM that pt now has a bed available on Friday at [place] [facility] and will not need the hospital bed or [facility] services.  CM did advise spouse to keep the wheelchair since it will be new and it can go with him to [place].  Updated Santiago Glad with [facility].  Updated Dr. Zenia Resides.  No further CM needs noted at this time.

## 2017-10-06 NOTE — ED Notes (Signed)
Bed: BO17 Expected date:  Expected time:  Means of arrival:  Comments: EMS 84 yo fall/facial pain/bilateral knee pain

## 2017-10-06 NOTE — ED Triage Notes (Addendum)
Pt arriving from home. Pt was upstairs, became weak and lost his balance. Pt found with face against walker and arms hung on supports of the walker. Denies LOC.  Skin tears to both elbows, knees red bilaterally with minor skin tear on left knee, and minor abrasion to right forehead

## 2017-10-06 NOTE — ED Provider Notes (Signed)
[facility] DEPT Provider Note   CSN: [id number] Arrival date & time: 10/06/17  6295     History   Chief Complaint Chief Complaint  Patient presents with  . Fall    HPI Justin Porter is a 84 y.o. male.  Is a 84 year old male who presents after mechanical fall at home.  Patient was in his baseline state of health until when he fell this morning while being transferred to a wheelchair.  Struck his right head and face without loss of consciousness.  Does take Eliquis.  Has had no confusion or vomiting.  He denies any new complaints of rib abdominal back or lower extremity discomfort.  Patient normally is nonambulatory.  EMS called and patient transported here.      Past Medical History:  Diagnosis Date  . Arthritis   . Hearing loss   . Hyperlipidemia   . Hypertension   . Prostate cancer ([place])    recurrent in the lung - had 1/3 of left lung removed and progressed to bone  . Urinary frequency     Patient Active Problem List   Diagnosis Date Noted  . Bone metastasis ([place]) 01/05/2017  . S/P right TKA 05/21/2014  . S/P knee replacement 05/21/2014  . Malignant neoplasm of prostate ([place]) 02/14/2014  . Expected blood loss anemia 11/29/2013  . S/P left THA, AA 11/28/2013  . Right inguinal hernia 01/01/2012  . Ruptured appendicitis 11/09/2011  . History of prostate cancer 09/15/2011  . History of lung cancer 09/15/2011  . HTN (hypertension) 09/15/2011    Past Surgical History:  Procedure Laterality Date  . BACK SURGERY  4-5 yrs ago   lower back  . ELBOW SURGERY     left  surgery for fx 3 yrs ago, right done 40 yrs ago  . HERNIA REPAIR  01-14-12   2003-left  . INGUINAL HERNIA REPAIR  01/22/2012   Procedure: HERNIA REPAIR INGUINAL ADULT;  Surgeon: Adin Hector, MD;  Location: [facility] ORS;  Service: General;  Laterality: Right;  . JOINT REPLACEMENT  2012   L TOTAL KNEE  . LAPAROSCOPIC APPENDECTOMY  10/15/2011   Procedure: APPENDECTOMY  LAPAROSCOPIC;  Surgeon: Stark Klein, MD;  Location: [facility] ORS;  Service: General;  Laterality: N/A;  . LOBECTOMY  2005  . LUNG BIOPSY  2005   resulted in 1/3 of left lung being removed  . PROSTATE SURGERY  1995  . TOTAL HIP ARTHROPLASTY Left 11/28/2013   Procedure: LEFT TOTAL HIP ARTHROPLASTY ANTERIOR APPROACH;  Surgeon: [name], MD;  Location: [facility] ORS;  Service: Orthopedics;  Laterality: Left;  . TOTAL KNEE ARTHROPLASTY Right 05/21/2014   Procedure: RIGHT TOTAL KNEE ARTHROPLASTY;  Surgeon: [name], MD;  Location: [facility];  Service: Orthopedics;  Laterality: Right;       Home Medications    Prior to Admission medications   Medication Sig Start Date End Date Taking? Authorizing Provider  ELIQUIS 5 MG TABS tablet Take 5 mg by mouth 2 (two) times daily. 12/10/16  Yes [provider]  furosemide (LASIX) 20 MG tablet Take 20 mg by mouth 2 (two) times daily.   Yes [provider]  gabapentin (NEURONTIN) 300 MG capsule Take 300 mg by mouth 3 (three) times daily.   Yes [provider]  polyethylene glycol (MIRALAX / GLYCOLAX) packet Take 17 g by mouth 2 (two) times daily. Patient taking differently: Take 17 g by mouth daily as needed for mild constipation.  05/23/14  Yes Babish,  Rodman Key, PA-C  simvastatin (ZOCOR) 20 MG tablet Take 20 mg by mouth every evening.    Yes [provider]  abiraterone Acetate (ZYTIGA) 250 MG tablet Take 4 tablets (1,000 mg total) by mouth daily. Take on an empty stomach 1 hour before or 2 hours after a meal Patient not taking: Reported on 03/09/2017 12/03/16   Wyatt Portela, MD  HYDROcodone-acetaminophen (NORCO) 5-325 MG tablet Take 1-2 tablets by mouth every 6 (six) hours as needed for moderate pain. Patient not taking: Reported on 10/06/2017 03/09/17   Freeman Caldron, PA-C    Family History Family History  Problem Relation Age of Onset  . Heart disease Mother   . Heart disease Father   . Pancreatic cancer Sister      Social History Social History   Tobacco Use  . Smoking status: Never Smoker  . Smokeless tobacco: Never Used  Substance Use Topics  . Alcohol use: No  . Drug use: No     Allergies   Codeine; Shrimp [shellfish allergy]; Tramadol; and Betadine [povidone iodine]   Review of Systems Review of Systems  All other systems reviewed and are negative.    Physical Exam Updated Vital Signs BP (!) 175/79   Pulse 97   Temp (!) 97.5 F (36.4 C) (Oral)   Resp (!) 36   SpO2 93%   Physical Exam  Constitutional: He is oriented to person, place, and time. He appears well-developed and well-nourished.  Non-toxic appearance. No distress.  HENT:  Head: Normocephalic and atraumatic.    Eyes: Conjunctivae, EOM and lids are normal. Pupils are equal, round, and reactive to light.  Neck: Normal range of motion. Neck supple. No tracheal deviation present. No thyroid mass present.    Cardiovascular: Normal rate, regular rhythm and normal heart sounds. Exam reveals no gallop.  No murmur heard. Pulmonary/Chest: Effort normal and breath sounds normal. No stridor. No respiratory distress. He has no decreased breath sounds. He has no wheezes. He has no rhonchi. He has no rales.  Abdominal: Soft. Normal appearance and bowel sounds are normal. He exhibits no distension. There is no tenderness. There is no rebound and no CVA tenderness.  Musculoskeletal: Normal range of motion. He exhibits no edema or tenderness.  No shortening or rotation of his lower extremities.  Neurological: He is alert and oriented to person, place, and time. He displays atrophy. No cranial nerve deficit. GCS eye subscore is 4. GCS verbal subscore is 5. GCS motor subscore is 6.  Bilateral lower extremity weakness noted which according to wife his baseline  Skin: Skin is warm and dry. No abrasion and no rash noted.  Psychiatric: He has a normal mood and affect. His speech is normal and behavior is normal.  Nursing note and  vitals reviewed.    ED Treatments / Results  Labs (all labs ordered are listed, but only abnormal results are displayed) Labs Reviewed - No data to display  EKG  EKG Interpretation None       Radiology No results found.  Procedures Procedures (including critical care time)  Medications Ordered in ED Medications - No data to display   Initial Impression / Assessment and Plan / ED Course  I have reviewed the triage vital signs and the nursing notes.  Pertinent labs & imaging results that were available during my care of the patient were reviewed by me and considered in my medical decision making (see chart for details).  Patient is imaging without evidence of acute process.  Home health has been arranged per care management.  Stable for discharge to home        [place]  (From admission, onward)        Start     Ordered   10/06/17 1035  For home use only DME standard manual wheelchair with seat cushion  Once    Comments:  Patient suffers from dementia which impairs their ability to perform daily activities like toileting in the home.  A walker will not resolve  issue with performing activities of daily living. A wheelchair will allow patient to safely perform daily activities. Patient can safely propel the wheelchair in the home or has a caregiver who can provide assistance.  Accessories: elevating leg rests (ELRs), wheel locks, extensions and anti-tippers.   10/06/17 1034   10/06/17 1034  For home use only DME Hospital bed  Once    Question:  Bed type  Answer:  Semi-electric   10/06/17 1034      Final Clinical Impressions(s) / ED Diagnoses   Final diagnoses:  None    ED Discharge Orders    None       Lacretia Leigh, MD 10/06/17 1036

## 2017-10-10 ENCOUNTER — Emergency Department (HOSPITAL_COMMUNITY): Payer: Medicare Other

## 2017-10-10 ENCOUNTER — Other Ambulatory Visit: Payer: Self-pay

## 2017-10-10 ENCOUNTER — Emergency Department (HOSPITAL_COMMUNITY)
Admission: EM | Admit: 2017-10-10 | Discharge: 2017-10-10 | Disposition: A | Discharge status: Rehabilitation Facility | Payer: Medicare Other | Attending: Emergency Medicine | Admitting: Emergency Medicine

## 2017-10-10 ENCOUNTER — Encounter (HOSPITAL_COMMUNITY): Payer: Self-pay

## 2017-10-10 DIAGNOSIS — S8990XA Unspecified injury of unspecified lower leg, initial encounter: Secondary | ICD-10-CM | POA: Diagnosis not present

## 2017-10-10 DIAGNOSIS — C61 Malignant neoplasm of prostate: Secondary | ICD-10-CM | POA: Diagnosis not present

## 2017-10-10 DIAGNOSIS — Z7901 Long term (current) use of anticoagulants: Secondary | ICD-10-CM | POA: Diagnosis not present

## 2017-10-10 DIAGNOSIS — R748 Abnormal levels of other serum enzymes: Secondary | ICD-10-CM | POA: Diagnosis not present

## 2017-10-10 DIAGNOSIS — M79652 Pain in left thigh: Secondary | ICD-10-CM | POA: Diagnosis not present

## 2017-10-10 DIAGNOSIS — E871 Hypo-osmolality and hyponatremia: Secondary | ICD-10-CM | POA: Diagnosis not present

## 2017-10-10 DIAGNOSIS — I10 Essential (primary) hypertension: Secondary | ICD-10-CM | POA: Diagnosis not present

## 2017-10-10 DIAGNOSIS — Z8546 Personal history of malignant neoplasm of prostate: Secondary | ICD-10-CM | POA: Insufficient documentation

## 2017-10-10 DIAGNOSIS — C7951 Secondary malignant neoplasm of bone: Secondary | ICD-10-CM | POA: Insufficient documentation

## 2017-10-10 DIAGNOSIS — Z96642 Presence of left artificial hip joint: Secondary | ICD-10-CM | POA: Diagnosis not present

## 2017-10-10 DIAGNOSIS — Z85118 Personal history of other malignant neoplasm of bronchus and lung: Secondary | ICD-10-CM | POA: Insufficient documentation

## 2017-10-10 DIAGNOSIS — Z96653 Presence of artificial knee joint, bilateral: Secondary | ICD-10-CM | POA: Diagnosis not present

## 2017-10-10 DIAGNOSIS — S3993XA Unspecified injury of pelvis, initial encounter: Secondary | ICD-10-CM | POA: Diagnosis not present

## 2017-10-10 DIAGNOSIS — S8992XA Unspecified injury of left lower leg, initial encounter: Secondary | ICD-10-CM | POA: Diagnosis not present

## 2017-10-10 DIAGNOSIS — S79922A Unspecified injury of left thigh, initial encounter: Secondary | ICD-10-CM | POA: Diagnosis not present

## 2017-10-10 DIAGNOSIS — C801 Malignant (primary) neoplasm, unspecified: Secondary | ICD-10-CM | POA: Diagnosis not present

## 2017-10-10 DIAGNOSIS — R32 Unspecified urinary incontinence: Secondary | ICD-10-CM | POA: Diagnosis not present

## 2017-10-10 DIAGNOSIS — Z79899 Other long term (current) drug therapy: Secondary | ICD-10-CM | POA: Insufficient documentation

## 2017-10-10 DIAGNOSIS — M25562 Pain in left knee: Secondary | ICD-10-CM | POA: Diagnosis not present

## 2017-10-10 DIAGNOSIS — M79605 Pain in left leg: Secondary | ICD-10-CM | POA: Diagnosis present

## 2017-10-10 DIAGNOSIS — J984 Other disorders of lung: Secondary | ICD-10-CM | POA: Diagnosis not present

## 2017-10-10 DIAGNOSIS — R102 Pelvic and perineal pain: Secondary | ICD-10-CM | POA: Diagnosis not present

## 2017-10-10 LAB — DIFFERENTIAL
BASOS ABS: 0 10*3/uL (ref 0.0–0.1)
Basophils Relative: 0 %
Eosinophils Absolute: 0 10*3/uL (ref 0.0–0.7)
Eosinophils Relative: 0 %
LYMPHS ABS: 0.8 10*3/uL (ref 0.7–4.0)
LYMPHS PCT: 10 %
Monocytes Absolute: 0.6 10*3/uL (ref 0.1–1.0)
Monocytes Relative: 8 %
NEUTROS ABS: 6.2 10*3/uL (ref 1.7–7.7)
NEUTROS PCT: 82 %

## 2017-10-10 LAB — I-STAT CHEM 8, ED
BUN: 15 mg/dL (ref 6–20)
CREATININE: 0.6 mg/dL — AB (ref 0.61–1.24)
Calcium, Ion: 1.11 mmol/L — ABNORMAL LOW (ref 1.15–1.40)
Chloride: 86 mmol/L — ABNORMAL LOW (ref 101–111)
GLUCOSE: 101 mg/dL — AB (ref 65–99)
HCT: 41 % (ref 39.0–52.0)
HEMOGLOBIN: 13.9 g/dL (ref 13.0–17.0)
Potassium: 3.8 mmol/L (ref 3.5–5.1)
Sodium: 125 mmol/L — ABNORMAL LOW (ref 135–145)
TCO2: 28 mmol/L (ref 22–32)

## 2017-10-10 LAB — CBC
HCT: 36.8 % — ABNORMAL LOW (ref 39.0–52.0)
Hemoglobin: 12.3 g/dL — ABNORMAL LOW (ref 13.0–17.0)
MCH: 30.8 pg (ref 26.0–34.0)
MCHC: 33.4 g/dL (ref 30.0–36.0)
MCV: 92.2 fL (ref 78.0–100.0)
Platelets: 252 10*3/uL (ref 150–400)
RBC: 3.99 MIL/uL — AB (ref 4.22–5.81)
RDW: 13.3 % (ref 11.5–15.5)
WBC: 7.6 10*3/uL (ref 4.0–10.5)

## 2017-10-10 LAB — COMPREHENSIVE METABOLIC PANEL
ALBUMIN: 3.6 g/dL (ref 3.5–5.0)
ALK PHOS: 1016 U/L — AB (ref 38–126)
ALT: 32 U/L (ref 17–63)
AST: 73 U/L — AB (ref 15–41)
Anion gap: 10 (ref 5–15)
BILIRUBIN TOTAL: 1 mg/dL (ref 0.3–1.2)
BUN: 16 mg/dL (ref 6–20)
CALCIUM: 8.6 mg/dL — AB (ref 8.9–10.3)
CO2: 28 mmol/L (ref 22–32)
Chloride: 87 mmol/L — ABNORMAL LOW (ref 101–111)
Creatinine, Ser: 0.6 mg/dL — ABNORMAL LOW (ref 0.61–1.24)
GFR calc Af Amer: >60 mL/min (ref >60)
GFR calc non Af Amer: >60 mL/min (ref >60)
GLUCOSE: 103 mg/dL — AB (ref 65–99)
Potassium: 3.9 mmol/L (ref 3.5–5.1)
SODIUM: 125 mmol/L — AB (ref 135–145)
TOTAL PROTEIN: 6.9 g/dL (ref 6.5–8.1)

## 2017-10-10 LAB — PROTIME-INR
INR: 1.19
Prothrombin Time: 15 seconds (ref 11.4–15.2)

## 2017-10-10 LAB — APTT: APTT: 32 s (ref 24–36)

## 2017-10-10 LAB — CBG MONITORING, ED: Glucose-Capillary: 94 mg/dL (ref 65–99)

## 2017-10-10 LAB — TROPONIN I: Troponin I: <0.03 ng/mL (ref <0.03)

## 2017-10-10 NOTE — ED Notes (Signed)
Bed: WA21 Expected date:  Expected time:  Means of arrival:  Comments: 84 yo weakness

## 2017-10-10 NOTE — ED Notes (Signed)
[facility] has been called  

## 2017-10-10 NOTE — ED Notes (Signed)
ED Provider at bedside. 

## 2017-10-10 NOTE — ED Triage Notes (Signed)
Pt from home via [facility] c/o fall last week and weakness since. Pt is unable to walk or stand and c/o frequent urination. Pt was able to ambulate w/o assistance prior to fall 10 days. Pt is A&O and in NAD. Pt is HOH. Pt in NAD

## 2017-10-10 NOTE — ED Notes (Signed)
ED TO INPATIENT HANDOFF REPORT  Name/Age/Gender Justin Porter 84 y.o. male  Code Status Code Status History    Date Active Date Inactive Code Status Order ID Comments User Context   05/21/2014 1331 05/23/2014 1328 Full Code [id number]  Pricilla Loveless, PA-C Inpatient   11/28/2013 1210 12/01/2013 1432 Full Code [id number]  Pricilla Loveless, PA-C Inpatient   10/14/2011 1254 10/17/2011 1435 Full Code [id number]  Lionel December, RN Inpatient   09/16/2011 1151 09/23/2011 1325 Full Code [id number]  Evlyn Clines, RN Inpatient      Home/SNF/Other Rehab  Chief Complaint Weakness  Level of Care/Admitting Diagnosis ED Disposition    ED Disposition Condition Comment   Discharge  The patient appears reasonably screened and/or stabilized for discharge and I doubt any other medical condition or other [facility] requiring further screening, evaluation, or treatment in the ED exists or is present at this time prior to discharge.       Medical History Past Medical History:  Diagnosis Date  . Arthritis   . Hearing loss   . Hyperlipidemia   . Hypertension   . Prostate cancer ([place])    recurrent in the lung - had 1/3 of left lung removed and progressed to bone  . Urinary frequency     Allergies Allergies  Allergen Reactions  . Codeine Nausea And Vomiting  . Shrimp [Shellfish Allergy] Nausea And Vomiting  . Tramadol Nausea And Vomiting  . Betadine [Povidone Iodine] Rash    IV Location/Drains/Wounds Patient Lines/Drains/Airways Status   Active Line/Drains/Airways    Name:   Placement date:   Placement time:   Site:   Days:   Peripheral IV 10/10/17 Left;Anterior;Lateral Forearm   10/10/17    0924    Forearm   less than 1          Labs/Imaging Results for orders placed or performed during the hospital encounter of 10/10/17 (from the past 48 hour(s))  CBG monitoring, ED     Status: None   Collection Time: 10/10/17  9:18 AM  Result Value Ref Range   Glucose-Capillary 94 65 - 99 mg/dL   Protime-INR     Status: None   Collection Time: 10/10/17  9:24 AM  Result Value Ref Range   Prothrombin Time 15.0 11.4 - 15.2 seconds   INR 1.19     Comment: Performed at [facility], [place] [address], [place], [place] [address]  APTT     Status: None   Collection Time: 10/10/17  9:24 AM  Result Value Ref Range   aPTT 32 24 - 36 seconds    Comment: Performed at [facility], [place] [address] [place], [place] [address]  CBC     Status: Abnormal   Collection Time: 10/10/17  9:24 AM  Result Value Ref Range   WBC 7.6 4.0 - 10.5 K/uL   RBC 3.99 (L) 4.22 - 5.81 MIL/uL   Hemoglobin 12.3 (L) 13.0 - 17.0 g/dL   HCT 36.8 (L) 39.0 - 52.0 %   MCV 92.2 78.0 - 100.0 fL   MCH 30.8 26.0 - 34.0 pg   MCHC 33.4 30.0 - 36.0 g/dL   RDW 13.3 11.5 - 15.5 %   Platelets 252 150 - 400 K/uL    Comment: Performed at [facility], [place] [address] [place], [place] [address]  Differential     Status: None   Collection Time: 10/10/17  9:24 AM  Result Value Ref Range   Neutrophils Relative %  82 %   Neutro Abs 6.2 1.7 - 7.7 K/uL   Lymphocytes Relative 10 %   Lymphs Abs 0.8 0.7 - 4.0 K/uL   Monocytes Relative 8 %   Monocytes Absolute 0.6 0.1 - 1.0 K/uL   Eosinophils Relative 0 %   Eosinophils Absolute 0.0 0.0 - 0.7 K/uL   Basophils Relative 0 %   Basophils Absolute 0.0 0.0 - 0.1 K/uL    Comment: Performed at [facility], [place] [address] [place], [place] [address]  Comprehensive metabolic panel     Status: Abnormal   Collection Time: 10/10/17  9:24 AM  Result Value Ref Range   Sodium 125 (L) 135 - 145 mmol/L   Potassium 3.9 3.5 - 5.1 mmol/L   Chloride 87 (L) 101 - 111 mmol/L   CO2 28 22 - 32 mmol/L   Glucose, Bld 103 (H) 65 - 99 mg/dL   BUN 16 6 - 20 mg/dL   Creatinine, Ser 0.60 (L) 0.61 - 1.24 mg/dL   Calcium 8.6 (L) 8.9 - 10.3 mg/dL   Total Protein 6.9 6.5 - 8.1 g/dL   Albumin 3.6 3.5 - 5.0 g/dL   AST 73 (H) 15 - 41 U/L    ALT 32 17 - 63 U/L   Alkaline Phosphatase 1,016 (H) 38 - 126 U/L   Total Bilirubin 1.0 0.3 - 1.2 mg/dL   GFR calc non Af Amer >60 >60 mL/min   GFR calc Af Amer >60 >60 mL/min    Comment: (NOTE) The eGFR has been calculated using the CKD EPI equation. This calculation has not been validated in all clinical situations. eGFR's persistently <60 mL/min signify possible Chronic Kidney Disease.    Anion gap 10 5 - 15    Comment: Performed at [facility], [place] [address] [place], [place] [address]  Troponin I     Status: None   Collection Time: 10/10/17  9:24 AM  Result Value Ref Range   Troponin I <0.03 <0.03 ng/mL    Comment: Performed at [facility], [place] [address], [place], [place] [address]  I-Stat Chem 8, ED     Status: Abnormal   Collection Time: 10/10/17  9:32 AM  Result Value Ref Range   Sodium 125 (L) 135 - 145 mmol/L   Potassium 3.8 3.5 - 5.1 mmol/L   Chloride 86 (L) 101 - 111 mmol/L   BUN 15 6 - 20 mg/dL   Creatinine, Ser 0.60 (L) 0.61 - 1.24 mg/dL   Glucose, Bld 101 (H) 65 - 99 mg/dL   Calcium, Ion 1.11 (L) 1.15 - 1.40 mmol/L   TCO2 28 22 - 32 mmol/L   Hemoglobin 13.9 13.0 - 17.0 g/dL   HCT 41.0 39.0 - 52.0 %   Dg Pelvis 1-2 Views  Result Date: 10/10/2017 CLINICAL DATA:  [place] 1 week ago, unable to stand/walk since, hip and knee pain, mid femoral shaft pain, history of LEFT knee arthroplasty 2012, LEFT hip arthroplasty 2015, prostate cancer, hypertension EXAM: PELVIS - 1-2 VIEW COMPARISON:  CT chest abdomen pelvis 11/19/2016 FINDINGS: Marked osseous demineralization. LEFT hip prosthesis. Prior lower lumbar fusion. RIGHT hip joint space and SI joint spaces preserved. Sclerotic and expansile destructive process identified in the anterior pelvis involving the superior pubic rami bilaterally, BILATERAL pubic bodies, and extending into the LEFT acetabulum compatible with metastatic prostate cancer. Observed osseous changes are progressive  particularly at the LEFT periacetabular region and at the RIGHT superior pubic ramus/pubic body. No definite acute  fracture or dislocation. IMPRESSION: Osseous demineralization. Progressive osseous metastatic disease involving the anterior pelvis bilaterally as above. Electronically Signed   By: Lavonia Dana M.D.   On: 10/10/2017 10:56   Dg Knee Complete 4 Views Left  Result Date: 10/10/2017 CLINICAL DATA:  [place] 1 week ago, unable to stand/walk since, hip and knee pain, mid femoral shaft pain, history of LEFT knee arthroplasty 2012, LEFT hip arthroplasty 2015, prostate cancer, hypertension EXAM: LEFT KNEE - COMPLETE 4+ VIEW COMPARISON:  LEFT femoral/knee radiographs 11/26/2010 FINDINGS: Diffuse osseous demineralization. Components of a LEFT knee prosthesis are again identified. No acute fracture, dislocation, or bone destruction. No periprosthetic lucency. IMPRESSION: Osseous demineralization with postsurgical changes of LEFT total knee arthroplasty. No acute abnormalities. Electronically Signed   By: Lavonia Dana M.D.   On: 10/10/2017 10:57   Dg Femur Min 2 Views Left  Result Date: 10/10/2017 CLINICAL DATA:  [place] 1 week ago, unable to stand/walk since, hip and knee pain, mid femoral shaft pain, history of LEFT knee arthroplasty 2012, LEFT hip arthroplasty 2015, prostate cancer, hypertension EXAM: LEFT FEMUR 2 VIEWS COMPARISON:  11/26/2010 FINDINGS: Diffuse osseous demineralization. Components of LEFT knee prosthesis and LEFT hip prosthesis identified. No acute fracture, dislocation, or bone destruction within the LEFT femur. Progressive osseous metastatic disease of the anterior pelvis identified, please refer to dedicated pelvic radiograph exam. IMPRESSION: Post LEFT hip and LEFT knee arthroplasty. Osseous demineralization without acute bony abnormalities. Electronically Signed   By: Lavonia Dana M.D.   On: 10/10/2017 10:59    Pending Labs Unresulted Labs (From admission, onward)   None       Vitals/Pain Today's Vitals   10/10/17 1257 10/10/17 1300 10/10/17 1400 10/10/17 1430  BP: (!) 157/78 (!) 150/72 (!) 127/58 (!) 151/92  Pulse: 84 85 74 90  Resp: (!) 22 (!) 22 (!) 23 (!) 23  Temp:      TempSrc:      SpO2: 98% 99% 94% 95%  Weight:      Height:      PainSc:        Isolation Precautions No active isolations  Medications Medications - No data to display  Mobility non-ambulatory

## 2017-10-10 NOTE — Progress Notes (Signed)
CSW met with pt and pt/family.  Pt from home after a fall.  Pt was previously preparing to go to [facility] and fell before receiving FL-2 from PCP.  CSW staffed with CSW Asst Director and received permission to create FL-2 and EDP will sign and CW will send to [facility].  Once FL-2 is received and Trish at [facility] at ph: [phone] authorizes, pt can be transported by [facility].    CSW will continue to follow for D/C needs.  Alphonse Guild. Christinamarie Tall, LCSW, LCAS, CSI Clinical Social Worker Ph: [phone]

## 2017-10-10 NOTE — NC FL2 (Signed)
[place] LEVEL OF CARE SCREENING TOOL     IDENTIFICATION  Patient Name: Justin Porter Birthdate: [birth year] Sex: male Admission Date (Current Location): 10/10/2017  [facility] and [place] Number:  Herbalist and Address:  [facility],  [place] [address], [place]      Provider Number: [id number]  Attending Physician Name and Address:  Daleen Bo, MD  Relative Name and Phone Number:       Current Level of Care: Hospital Recommended Level of Care: [place] Prior Approval Number:    Date Approved/Denied: 09/10/10 PASRR Number: [id number]  Discharge Plan: SNF    Current Diagnoses: Patient Active Problem List   Diagnosis Date Noted  . Bone metastasis ([place]) 01/05/2017  . S/P right TKA 05/21/2014  . S/P knee replacement 05/21/2014  . Malignant neoplasm of prostate ([place]) 02/14/2014  . Expected blood loss anemia 11/29/2013  . S/P left THA, AA 11/28/2013  . Right inguinal hernia 01/01/2012  . Ruptured appendicitis 11/09/2011  . History of prostate cancer 09/15/2011  . History of lung cancer 09/15/2011  . HTN (hypertension) 09/15/2011    Orientation RESPIRATION BLADDER Height & Weight     Self, Time, Place, Situation  Normal Continent Weight: 180 lb (81.6 kg) Height:  6' (182.9 cm)  BEHAVIORAL SYMPTOMS/MOOD NEUROLOGICAL BOWEL NUTRITION STATUS      Continent    AMBULATORY STATUS COMMUNICATION OF NEEDS Skin   Limited Assist Verbally Normal                       Personal Care Assistance Level of Assistance  Bathing, Dressing Bathing Assistance: Limited assistance   Dressing Assistance: Limited assistance     Functional Limitations Info             SPECIAL CARE FACTORS FREQUENCY  PT (By licensed PT)     PT Frequency: 5 OT Frequency: 5            Contractures Contractures Info: Not present    Additional Factors Info  Allergies, Code Status Code Status Info: Prior Allergies Info:  Codeine, Shrimp Shellfish Allergy, Tramadol, Betadine Povidone Iodine           Current Medications (10/10/2017):  This is the current hospital active medication list No current facility-administered medications for this encounter.    Current Outpatient Medications  Medication Sig Dispense Refill  . apixaban (ELIQUIS) 2.5 MG TABS tablet Take 2.5 mg by mouth 2 (two) times daily.    . furosemide (LASIX) 20 MG tablet Take 20 mg by mouth 2 (two) times daily.    Marland Kitchen gabapentin (NEURONTIN) 300 MG capsule Take 300 mg by mouth 3 (three) times daily.    . simvastatin (ZOCOR) 20 MG tablet Take 20 mg by mouth every evening.     Marland Kitchen abiraterone Acetate (ZYTIGA) 250 MG tablet Take 4 tablets (1,000 mg total) by mouth daily. Take on an empty stomach 1 hour before or 2 hours after a meal (Patient not taking: Reported on 03/09/2017) 120 tablet 0  . ELIQUIS 5 MG TABS tablet Take 5 mg by mouth 2 (two) times daily.  6  . HYDROcodone-acetaminophen (NORCO) 5-325 MG tablet Take 1-2 tablets by mouth every 6 (six) hours as needed for moderate pain. (Patient not taking: Reported on 10/06/2017) 60 tablet 0  . polyethylene glycol (MIRALAX / GLYCOLAX) packet Take 17 g by mouth 2 (two) times daily. (Patient taking differently: Take 17 g by mouth daily as needed  for mild constipation. ) 14 each 0     Discharge Medications: Please see discharge summary for a list of discharge medications.  Relevant Imaging Results:  Relevant Lab Results:   Additional Information [id number]  Alphonse Guild Teddie Mehta, LCSWA

## 2017-10-10 NOTE — Discharge Instructions (Addendum)
Go to the nursing care facility, now to receive additional care and treatment.

## 2017-10-10 NOTE — Progress Notes (Signed)
Per [place] MUST pt's PASSR is [id number]

## 2017-10-10 NOTE — ED Provider Notes (Addendum)
[facility] DEPT Provider Note   CSN: [id number] Arrival date & time: 10/10/17  4742     History   Chief Complaint Chief Complaint  Patient presents with  . Extremity Weakness    HPI Justin Porter is a 84 y.o. male.  Debilitated elderly man who presents for evaluation of difficulty using left leg which makes it impossible to walk.  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  He was recently in the emergency department after a fall, and discharged.  Since the fall he has been unable to walk because of pain in the left leg.  He typically requires assistance for mobility at home.  He has had increased left leg swelling, associated with a decreasing dose of Eliquis.  He is felt to not be a candidate for systemic chemotherapy.  He has had palliative radiation for bone metastasis pain.  Plans in place for supportive management.  Last evaluation with oncology 12/24/2017.  There is been no problem eating, no fevers, no cough, shortness of breath or chest pain.  There are no other known modifying factors.  HPI  Past Medical History:  Diagnosis Date  . Arthritis   . Hearing loss   . Hyperlipidemia   . Hypertension   . Prostate cancer ([place])    recurrent in the lung - had 1/3 of left lung removed and progressed to bone  . Urinary frequency     Patient Active Problem List   Diagnosis Date Noted  . Bone metastasis ([place]) 01/05/2017  . S/P right TKA 05/21/2014  . S/P knee replacement 05/21/2014  . Malignant neoplasm of prostate ([place]) 02/14/2014  . Expected blood loss anemia 11/29/2013  . S/P left THA, AA 11/28/2013  . Right inguinal hernia 01/01/2012  . Ruptured appendicitis 11/09/2011  . History of prostate cancer 09/15/2011  . History of lung cancer 09/15/2011  . HTN (hypertension) 09/15/2011    Past Surgical History:  Procedure Laterality Date  . BACK SURGERY  4-5 yrs ago   lower back  . ELBOW  SURGERY     left  surgery for fx 3 yrs ago, right done 40 yrs ago  . HERNIA REPAIR  01-14-12   2003-left  . INGUINAL HERNIA REPAIR  01/22/2012   Procedure: HERNIA REPAIR INGUINAL ADULT;  Surgeon: Adin Hector, MD;  Location: [facility];  Service: General;  Laterality: Right;  . JOINT REPLACEMENT  2012   L TOTAL KNEE  . LAPAROSCOPIC APPENDECTOMY  10/15/2011   Procedure: APPENDECTOMY LAPAROSCOPIC;  Surgeon: Stark Klein, MD;  Location: [facility];  Service: General;  Laterality: N/A;  . LOBECTOMY  2005  . LUNG BIOPSY  2005   resulted in 1/3 of left lung being removed  . PROSTATE SURGERY  1995  . TOTAL HIP ARTHROPLASTY Left 11/28/2013   Procedure: LEFT TOTAL HIP ARTHROPLASTY ANTERIOR APPROACH;  Surgeon: [name], MD;  Location: [facility];  Service: Orthopedics;  Laterality: Left;  . TOTAL KNEE ARTHROPLASTY Right 05/21/2014   Procedure: RIGHT TOTAL KNEE ARTHROPLASTY;  Surgeon: [name], MD;  Location: [facility];  Service: Orthopedics;  Laterality: Right;        Home Medications    Prior to Admission medications   Medication Sig Start Date End Date Taking? Authorizing Provider  apixaban (ELIQUIS) 2.5 MG TABS tablet Take 2.5 mg by mouth 2 (two) times daily.   Yes [provider]  furosemide (LASIX) 20 MG tablet Take 20 mg  by mouth 2 (two) times daily.   Yes [provider]  gabapentin (NEURONTIN) 300 MG capsule Take 300 mg by mouth 3 (three) times daily.   Yes [provider]  simvastatin (ZOCOR) 20 MG tablet Take 20 mg by mouth every evening.    Yes [provider]  abiraterone Acetate (ZYTIGA) 250 MG tablet Take 4 tablets (1,000 mg total) by mouth daily. Take on an empty stomach 1 hour before or 2 hours after a meal Patient not taking: Reported on 03/09/2017 12/03/16   Wyatt Portela, MD  ELIQUIS 5 MG TABS tablet Take 5 mg by mouth 2 (two) times daily. 12/10/16   [provider]  HYDROcodone-acetaminophen (NORCO) 5-325 MG tablet Take 1-2 tablets by  mouth every 6 (six) hours as needed for moderate pain. Patient not taking: Reported on 10/06/2017 03/09/17   Bruning, Ashlyn, PA-C  polyethylene glycol (MIRALAX / GLYCOLAX) packet Take 17 g by mouth 2 (two) times daily. Patient taking differently: Take 17 g by mouth daily as needed for mild constipation.  05/23/14   Danae Orleans, PA-C    Family History Family History  Problem Relation Age of Onset  . Heart disease Mother   . Heart disease Father   . Pancreatic cancer Sister     Social History Social History   Tobacco Use  . Smoking status: Never Smoker  . Smokeless tobacco: Never Used  Substance Use Topics  . Alcohol use: No  . Drug use: No     Allergies   Codeine; Shrimp [shellfish allergy]; Tramadol; and Betadine [povidone iodine]   Review of Systems Review of Systems  All other systems reviewed and are negative.    Physical Exam Updated Vital Signs BP (!) 127/58 Comment: Simultaneous filing. User may not have seen previous data.  Pulse 74 Comment: Simultaneous filing. User may not have seen previous data.  Temp (!) 97.5 F (36.4 C) (Oral)   Resp (!) 23 Comment: Simultaneous filing. User may not have seen previous data.  Ht 6' (1.829 m)   Wt 81.6 kg (180 lb)   SpO2 94% Comment: Simultaneous filing. User may not have seen previous data.  BMI 24.41 kg/m   Physical Exam  Constitutional: He is oriented to person, place, and time. He appears well-developed. No distress.  Elderly, frail  HENT:  Head: Normocephalic and atraumatic.  Right Ear: External ear normal.  Left Ear: External ear normal.  Eyes: Pupils are equal, round, and reactive to light. Conjunctivae and EOM are normal.  Neck: Normal range of motion and phonation normal. Neck supple.  Cardiovascular: Normal rate, regular rhythm and normal heart sounds.  Pulmonary/Chest: Effort normal and breath sounds normal. He exhibits no bony tenderness.  Abdominal: Soft. There is no tenderness.  Musculoskeletal:    Normal range of motion arms and right leg.  Left leg decreased mobility secondary to pain, with swelling from the left hip to the foot.  Patient resists passive motion secondary to left knee pain.  Neurological: He is alert and oriented to person, place, and time. No cranial nerve deficit or sensory deficit. He exhibits normal muscle tone. Coordination normal.  Skin: Skin is warm, dry and intact.  Scattered ecchymosis, face, left upper arm and anterior knees bilaterally.  Superficial healing abrasions over both knees anteriorly.  No evidence for fluctuance or drainage of the wounds of the left or right knee.  Psychiatric: He has a normal mood and affect. His behavior is normal. Judgment and thought content normal.  Nursing  note and vitals reviewed.    ED Treatments / Results  Labs (all labs ordered are listed, but only abnormal results are displayed) Labs Reviewed  CBC - Abnormal; Notable for the following components:      Result Value   RBC 3.99 (*)    Hemoglobin 12.3 (*)    HCT 36.8 (*)    All other components within normal limits  COMPREHENSIVE METABOLIC PANEL - Abnormal; Notable for the following components:   Sodium 125 (*)    Chloride 87 (*)    Glucose, Bld 103 (*)    Creatinine, Ser 0.60 (*)    Calcium 8.6 (*)    AST 73 (*)    Alkaline Phosphatase 1,016 (*)    All other components within normal limits  I-STAT CHEM 8, ED - Abnormal; Notable for the following components:   Sodium 125 (*)    Chloride 86 (*)    Creatinine, Ser 0.60 (*)    Glucose, Bld 101 (*)    Calcium, Ion 1.11 (*)    All other components within normal limits  PROTIME-INR  APTT  DIFFERENTIAL  TROPONIN I  CBG MONITORING, ED    EKG EKG Interpretation  Date/Time:  Sunday October 10 2017 08:48:12 EDT Ventricular Rate:  83 PR Interval:    QRS Duration: 80 QT Interval:  360 QTC Calculation: 423 R Axis:   64 Text Interpretation:  Sinus rhythm Abnormal R-wave progression, early transition since last  tracing no significant change Confirmed by Daleen Bo [phone]) on 10/10/2017 1:33:01 PM   Radiology Dg Pelvis 1-2 Views  Result Date: 10/10/2017 CLINICAL DATA:  [place] 1 week ago, unable to stand/walk since, hip and knee pain, mid femoral shaft pain, history of LEFT knee arthroplasty 2012, LEFT hip arthroplasty 2015, prostate cancer, hypertension EXAM: PELVIS - 1-2 VIEW COMPARISON:  CT chest abdomen pelvis 11/19/2016 FINDINGS: Marked osseous demineralization. LEFT hip prosthesis. Prior lower lumbar fusion. RIGHT hip joint space and SI joint spaces preserved. Sclerotic and expansile destructive process identified in the anterior pelvis involving the superior pubic rami bilaterally, BILATERAL pubic bodies, and extending into the LEFT acetabulum compatible with metastatic prostate cancer. Observed osseous changes are progressive particularly at the LEFT periacetabular region and at the RIGHT superior pubic ramus/pubic body. No definite acute fracture or dislocation. IMPRESSION: Osseous demineralization. Progressive osseous metastatic disease involving the anterior pelvis bilaterally as above. Electronically Signed   By: Lavonia Dana M.D.   On: 10/10/2017 10:56   Dg Knee Complete 4 Views Left  Result Date: 10/10/2017 CLINICAL DATA:  [place] 1 week ago, unable to stand/walk since, hip and knee pain, mid femoral shaft pain, history of LEFT knee arthroplasty 2012, LEFT hip arthroplasty 2015, prostate cancer, hypertension EXAM: LEFT KNEE - COMPLETE 4+ VIEW COMPARISON:  LEFT femoral/knee radiographs 11/26/2010 FINDINGS: Diffuse osseous demineralization. Components of a LEFT knee prosthesis are again identified. No acute fracture, dislocation, or bone destruction. No periprosthetic lucency. IMPRESSION: Osseous demineralization with postsurgical changes of LEFT total knee arthroplasty. No acute abnormalities. Electronically Signed   By: Lavonia Dana M.D.   On: 10/10/2017 10:57   Dg Femur Min 2 Views Left  Result Date:  10/10/2017 CLINICAL DATA:  [place] 1 week ago, unable to stand/walk since, hip and knee pain, mid femoral shaft pain, history of LEFT knee arthroplasty 2012, LEFT hip arthroplasty 2015, prostate cancer, hypertension EXAM: LEFT FEMUR 2 VIEWS COMPARISON:  11/26/2010 FINDINGS: Diffuse osseous demineralization. Components of LEFT knee prosthesis and LEFT hip prosthesis identified. No acute fracture, dislocation,  or bone destruction within the LEFT femur. Progressive osseous metastatic disease of the anterior pelvis identified, please refer to dedicated pelvic radiograph exam. IMPRESSION: Post LEFT hip and LEFT knee arthroplasty. Osseous demineralization without acute bony abnormalities. Electronically Signed   By: Lavonia Dana M.D.   On: 10/10/2017 10:59    Procedures Procedures (including critical care time)  Medications Ordered in ED Medications - No data to display   Initial Impression / Assessment and Plan / ED Course  I have reviewed the triage vital signs and the nursing notes.  Pertinent labs & imaging results that were available during my care of the patient were reviewed by me and considered in my medical decision making (see chart for details).  Clinical Course as of Oct 11 1430  Sun Oct 10, 2017  1330 Normal except sodium low at 125, chloride low 86, glucose elevated 101.  I-Stat Chem 8, ED(!) [EW]  1330 Elevated.  Alkaline Phosphatase(!): 1,016 [EW]  1331 Normal except hemoglobin low 12.3  CBC(!) [EW]  1331 Consistent with progression of pelvic metastases, causing weightbearing pain, when using left leg.  DG Pelvis 1-2 Views [EW]  1332 No fracture  DG Knee Complete 4 Views Left [EW]  1332 No fracture  DG Femur Min 2 Views Left [EW]  8841 The case was discussed with his oncologist, Dr. Alen Blew.  He states that there are no possible additional oncologic treatments including chemotherapy and radiation for this patient.  He suggest proceeding with placement, and hospice care.  These  findings were discussed with the patient and his wife, who agreed to proceed with this plan.  And FL2 has been completed and I have signed it.  Patient's wife has secured a bed, today for the patient to be transferred to.  The facility has been informed of the plans and can initiate hospice care from their location.   [EW]  1400 Patient is being transferred to a skilled nursing facility, [facility] in [place].  Their phone number is [phone], extension [phone].  He is going to room 712.   [EW]    Clinical Course User Index [EW] Daleen Bo, MD     Patient Vitals for the past 24 hrs:  BP Temp Temp src Pulse Resp SpO2 Height Weight  10/10/17 1400 (!) 127/58 - - 74 (!) 23 94 % - -  10/10/17 1300 (!) 150/72 - - 85 (!) 22 99 % - -  10/10/17 1257 (!) 157/78 - - 84 (!) 22 98 % - -  10/10/17 1120 139/69 - - 79 17 97 % - -  10/10/17 0858 - - - - - - 6' (1.829 m) 81.6 kg (180 lb)  10/10/17 0848 (!) 161/81 (!) 97.5 F (36.4 C) Oral 83 20 99 % - -    1:35 PM Reevaluation with update and discussion. After initial assessment and treatment, an updated evaluation reveals no change in clinical status.  He remains unable to walk.  Findings discussed and questions answered. Daleen Bo   MDM_metastatic prostate cancer with bony metastases, worsening, weightbearing location on the left, therefore he is unable to ambulate.  Serum alkaline phosphatase level is markedly elevated as compared to 4 months ago, baseline, consistent with progressive bony metastases.  Patient needs placement for management and end-of-life care.  Hospice will be initiated when he gets to his nursing care facility.  Nursing Notes Reviewed/ Care Coordinated Applicable Imaging Reviewed Interpretation of Laboratory Data incorporated into ED treatment  The patient appears reasonably screened and/or stabilized for  discharge and I doubt any other medical condition or other [facility] requiring further screening, evaluation, or treatment in the  ED at this time prior to discharge.  Plan: Home Medications-continue usual treatment; Home Treatments-increase oral fluid hydration and eat 3 meals a day; return here if the recommended treatment, does not improve the symptoms; Recommended follow up-PCP follow-up ASAP at facility.  Initiate hospice care as soon as possible.   Final Clinical Impressions(s) / ED Diagnoses   Final diagnoses:  Prostate cancer metastatic to bone ([place])  Hyponatremia  Elevated serum alkaline phosphatase level    ED Discharge Orders    None       Daleen Bo, MD 10/10/17 1340    Daleen Bo, MD 10/10/17 1432

## 2017-10-10 NOTE — Progress Notes (Addendum)
CSW received a call from [facility]  Pt has been accepted by: [facility] in [place] Number for report is: [phone] (Heather Elam LPN) Pt's unit/room/bed number will be: Room 712 Accepting physician: SN[facility] MD  Pt can arrive ASAP on 10/10/17 after facility has received pt's signed FL-2 and officially authorized transport.  1:41 PM FL-2 signed by EDP, CSW sent via the hub and spoke to [place] in admissions.  Trish authorized CSW to speak to RN and inform RN pt can be transported.  CSW will update RN and EDP.  Justin Porter. Justin Blyth, LCSW, LCAS, CSI Clinical Social Worker Ph: [phone]

## 2017-10-11 DIAGNOSIS — R2689 Other abnormalities of gait and mobility: Secondary | ICD-10-CM | POA: Diagnosis not present

## 2017-10-11 DIAGNOSIS — C3490 Malignant neoplasm of unspecified part of unspecified bronchus or lung: Secondary | ICD-10-CM | POA: Diagnosis not present

## 2017-10-11 DIAGNOSIS — M6259 Muscle wasting and atrophy, not elsewhere classified, multiple sites: Secondary | ICD-10-CM | POA: Diagnosis not present

## 2017-10-11 DIAGNOSIS — R296 Repeated falls: Secondary | ICD-10-CM | POA: Diagnosis not present

## 2017-10-11 DIAGNOSIS — Z741 Need for assistance with personal care: Secondary | ICD-10-CM | POA: Diagnosis not present

## 2017-10-11 DIAGNOSIS — R54 Age-related physical debility: Secondary | ICD-10-CM | POA: Diagnosis not present

## 2017-10-11 DIAGNOSIS — C7951 Secondary malignant neoplasm of bone: Secondary | ICD-10-CM | POA: Diagnosis not present

## 2017-10-11 DIAGNOSIS — D075 Carcinoma in situ of prostate: Secondary | ICD-10-CM | POA: Diagnosis not present

## 2017-10-12 DIAGNOSIS — Z741 Need for assistance with personal care: Secondary | ICD-10-CM | POA: Diagnosis not present

## 2017-10-12 DIAGNOSIS — C3490 Malignant neoplasm of unspecified part of unspecified bronchus or lung: Secondary | ICD-10-CM | POA: Diagnosis not present

## 2017-10-12 DIAGNOSIS — R296 Repeated falls: Secondary | ICD-10-CM | POA: Diagnosis not present

## 2017-10-12 DIAGNOSIS — R54 Age-related physical debility: Secondary | ICD-10-CM | POA: Diagnosis not present

## 2017-10-12 DIAGNOSIS — M6259 Muscle wasting and atrophy, not elsewhere classified, multiple sites: Secondary | ICD-10-CM | POA: Diagnosis not present

## 2017-10-12 DIAGNOSIS — C7951 Secondary malignant neoplasm of bone: Secondary | ICD-10-CM | POA: Diagnosis not present

## 2017-10-13 DIAGNOSIS — M6259 Muscle wasting and atrophy, not elsewhere classified, multiple sites: Secondary | ICD-10-CM | POA: Diagnosis not present

## 2017-10-13 DIAGNOSIS — Z741 Need for assistance with personal care: Secondary | ICD-10-CM | POA: Diagnosis not present

## 2017-10-13 DIAGNOSIS — R54 Age-related physical debility: Secondary | ICD-10-CM | POA: Diagnosis not present

## 2017-10-13 DIAGNOSIS — E441 Mild protein-calorie malnutrition: Secondary | ICD-10-CM | POA: Diagnosis not present

## 2017-10-13 DIAGNOSIS — C7951 Secondary malignant neoplasm of bone: Secondary | ICD-10-CM | POA: Diagnosis not present

## 2017-10-13 DIAGNOSIS — R5381 Other malaise: Secondary | ICD-10-CM | POA: Diagnosis not present

## 2017-10-13 DIAGNOSIS — C3490 Malignant neoplasm of unspecified part of unspecified bronchus or lung: Secondary | ICD-10-CM | POA: Diagnosis not present

## 2017-10-13 DIAGNOSIS — J9621 Acute and chronic respiratory failure with hypoxia: Secondary | ICD-10-CM | POA: Diagnosis not present

## 2017-10-13 DIAGNOSIS — R296 Repeated falls: Secondary | ICD-10-CM | POA: Diagnosis not present

## 2017-10-14 DIAGNOSIS — R296 Repeated falls: Secondary | ICD-10-CM | POA: Diagnosis not present

## 2017-10-14 DIAGNOSIS — C7951 Secondary malignant neoplasm of bone: Secondary | ICD-10-CM | POA: Diagnosis not present

## 2017-10-14 DIAGNOSIS — R54 Age-related physical debility: Secondary | ICD-10-CM | POA: Diagnosis not present

## 2017-10-14 DIAGNOSIS — M6259 Muscle wasting and atrophy, not elsewhere classified, multiple sites: Secondary | ICD-10-CM | POA: Diagnosis not present

## 2017-10-14 DIAGNOSIS — Z741 Need for assistance with personal care: Secondary | ICD-10-CM | POA: Diagnosis not present

## 2017-10-14 DIAGNOSIS — C3490 Malignant neoplasm of unspecified part of unspecified bronchus or lung: Secondary | ICD-10-CM | POA: Diagnosis not present

## 2017-10-15 DIAGNOSIS — Z741 Need for assistance with personal care: Secondary | ICD-10-CM | POA: Diagnosis not present

## 2017-10-15 DIAGNOSIS — R296 Repeated falls: Secondary | ICD-10-CM | POA: Diagnosis not present

## 2017-10-15 DIAGNOSIS — R54 Age-related physical debility: Secondary | ICD-10-CM | POA: Diagnosis not present

## 2017-10-15 DIAGNOSIS — M6259 Muscle wasting and atrophy, not elsewhere classified, multiple sites: Secondary | ICD-10-CM | POA: Diagnosis not present

## 2017-10-15 DIAGNOSIS — C7951 Secondary malignant neoplasm of bone: Secondary | ICD-10-CM | POA: Diagnosis not present

## 2017-10-15 DIAGNOSIS — C3490 Malignant neoplasm of unspecified part of unspecified bronchus or lung: Secondary | ICD-10-CM | POA: Diagnosis not present

## 2017-10-17 DIAGNOSIS — C7951 Secondary malignant neoplasm of bone: Secondary | ICD-10-CM | POA: Diagnosis not present

## 2017-10-17 DIAGNOSIS — Z741 Need for assistance with personal care: Secondary | ICD-10-CM | POA: Diagnosis not present

## 2017-10-17 DIAGNOSIS — M6259 Muscle wasting and atrophy, not elsewhere classified, multiple sites: Secondary | ICD-10-CM | POA: Diagnosis not present

## 2017-10-17 DIAGNOSIS — R54 Age-related physical debility: Secondary | ICD-10-CM | POA: Diagnosis not present

## 2017-10-17 DIAGNOSIS — R296 Repeated falls: Secondary | ICD-10-CM | POA: Diagnosis not present

## 2017-10-17 DIAGNOSIS — C3490 Malignant neoplasm of unspecified part of unspecified bronchus or lung: Secondary | ICD-10-CM | POA: Diagnosis not present

## 2017-10-18 DIAGNOSIS — M6259 Muscle wasting and atrophy, not elsewhere classified, multiple sites: Secondary | ICD-10-CM | POA: Diagnosis not present

## 2017-10-18 DIAGNOSIS — R2689 Other abnormalities of gait and mobility: Secondary | ICD-10-CM | POA: Diagnosis not present

## 2017-10-18 DIAGNOSIS — C7951 Secondary malignant neoplasm of bone: Secondary | ICD-10-CM | POA: Diagnosis not present

## 2017-10-18 DIAGNOSIS — R54 Age-related physical debility: Secondary | ICD-10-CM | POA: Diagnosis not present

## 2017-10-19 DIAGNOSIS — R54 Age-related physical debility: Secondary | ICD-10-CM | POA: Diagnosis not present

## 2017-10-19 DIAGNOSIS — M6259 Muscle wasting and atrophy, not elsewhere classified, multiple sites: Secondary | ICD-10-CM | POA: Diagnosis not present

## 2017-10-19 DIAGNOSIS — R2689 Other abnormalities of gait and mobility: Secondary | ICD-10-CM | POA: Diagnosis not present

## 2017-10-19 DIAGNOSIS — C7951 Secondary malignant neoplasm of bone: Secondary | ICD-10-CM | POA: Diagnosis not present

## 2017-10-20 DIAGNOSIS — R54 Age-related physical debility: Secondary | ICD-10-CM | POA: Diagnosis not present

## 2017-10-20 DIAGNOSIS — R2689 Other abnormalities of gait and mobility: Secondary | ICD-10-CM | POA: Diagnosis not present

## 2017-10-20 DIAGNOSIS — C7951 Secondary malignant neoplasm of bone: Secondary | ICD-10-CM | POA: Diagnosis not present

## 2017-10-20 DIAGNOSIS — M6259 Muscle wasting and atrophy, not elsewhere classified, multiple sites: Secondary | ICD-10-CM | POA: Diagnosis not present

## 2017-10-21 DIAGNOSIS — R2689 Other abnormalities of gait and mobility: Secondary | ICD-10-CM | POA: Diagnosis not present

## 2017-10-21 DIAGNOSIS — R54 Age-related physical debility: Secondary | ICD-10-CM | POA: Diagnosis not present

## 2017-10-21 DIAGNOSIS — M6259 Muscle wasting and atrophy, not elsewhere classified, multiple sites: Secondary | ICD-10-CM | POA: Diagnosis not present

## 2017-10-21 DIAGNOSIS — C7951 Secondary malignant neoplasm of bone: Secondary | ICD-10-CM | POA: Diagnosis not present

## 2017-10-22 DIAGNOSIS — M6259 Muscle wasting and atrophy, not elsewhere classified, multiple sites: Secondary | ICD-10-CM | POA: Diagnosis not present

## 2017-10-22 DIAGNOSIS — R2689 Other abnormalities of gait and mobility: Secondary | ICD-10-CM | POA: Diagnosis not present

## 2017-10-22 DIAGNOSIS — C7951 Secondary malignant neoplasm of bone: Secondary | ICD-10-CM | POA: Diagnosis not present

## 2017-10-22 DIAGNOSIS — R54 Age-related physical debility: Secondary | ICD-10-CM | POA: Diagnosis not present

## 2017-10-25 DIAGNOSIS — C7951 Secondary malignant neoplasm of bone: Secondary | ICD-10-CM | POA: Diagnosis not present

## 2017-10-25 DIAGNOSIS — R2689 Other abnormalities of gait and mobility: Secondary | ICD-10-CM | POA: Diagnosis not present

## 2017-10-25 DIAGNOSIS — M6259 Muscle wasting and atrophy, not elsewhere classified, multiple sites: Secondary | ICD-10-CM | POA: Diagnosis not present

## 2017-10-25 DIAGNOSIS — R54 Age-related physical debility: Secondary | ICD-10-CM | POA: Diagnosis not present

## 2017-10-26 DIAGNOSIS — C7951 Secondary malignant neoplasm of bone: Secondary | ICD-10-CM | POA: Diagnosis not present

## 2017-10-26 DIAGNOSIS — R2689 Other abnormalities of gait and mobility: Secondary | ICD-10-CM | POA: Diagnosis not present

## 2017-10-26 DIAGNOSIS — M6259 Muscle wasting and atrophy, not elsewhere classified, multiple sites: Secondary | ICD-10-CM | POA: Diagnosis not present

## 2017-10-26 DIAGNOSIS — R54 Age-related physical debility: Secondary | ICD-10-CM | POA: Diagnosis not present

## 2017-10-27 DIAGNOSIS — C7951 Secondary malignant neoplasm of bone: Secondary | ICD-10-CM | POA: Diagnosis not present

## 2017-10-27 DIAGNOSIS — R54 Age-related physical debility: Secondary | ICD-10-CM | POA: Diagnosis not present

## 2017-10-27 DIAGNOSIS — M6259 Muscle wasting and atrophy, not elsewhere classified, multiple sites: Secondary | ICD-10-CM | POA: Diagnosis not present

## 2017-10-27 DIAGNOSIS — R2689 Other abnormalities of gait and mobility: Secondary | ICD-10-CM | POA: Diagnosis not present

## 2017-10-28 DIAGNOSIS — M6259 Muscle wasting and atrophy, not elsewhere classified, multiple sites: Secondary | ICD-10-CM | POA: Diagnosis not present

## 2017-10-28 DIAGNOSIS — C7951 Secondary malignant neoplasm of bone: Secondary | ICD-10-CM | POA: Diagnosis not present

## 2017-10-28 DIAGNOSIS — R2689 Other abnormalities of gait and mobility: Secondary | ICD-10-CM | POA: Diagnosis not present

## 2017-10-28 DIAGNOSIS — R54 Age-related physical debility: Secondary | ICD-10-CM | POA: Diagnosis not present

## 2017-10-29 DIAGNOSIS — R2689 Other abnormalities of gait and mobility: Secondary | ICD-10-CM | POA: Diagnosis not present

## 2017-10-29 DIAGNOSIS — R05 Cough: Secondary | ICD-10-CM | POA: Diagnosis not present

## 2017-10-29 DIAGNOSIS — M6259 Muscle wasting and atrophy, not elsewhere classified, multiple sites: Secondary | ICD-10-CM | POA: Diagnosis not present

## 2017-10-29 DIAGNOSIS — R062 Wheezing: Secondary | ICD-10-CM | POA: Diagnosis not present

## 2017-10-29 DIAGNOSIS — C7951 Secondary malignant neoplasm of bone: Secondary | ICD-10-CM | POA: Diagnosis not present

## 2017-10-29 DIAGNOSIS — R54 Age-related physical debility: Secondary | ICD-10-CM | POA: Diagnosis not present

## 2017-10-30 DIAGNOSIS — R2689 Other abnormalities of gait and mobility: Secondary | ICD-10-CM | POA: Diagnosis not present

## 2017-10-30 DIAGNOSIS — M6259 Muscle wasting and atrophy, not elsewhere classified, multiple sites: Secondary | ICD-10-CM | POA: Diagnosis not present

## 2017-10-30 DIAGNOSIS — C7951 Secondary malignant neoplasm of bone: Secondary | ICD-10-CM | POA: Diagnosis not present

## 2017-10-30 DIAGNOSIS — R54 Age-related physical debility: Secondary | ICD-10-CM | POA: Diagnosis not present

## 2017-10-31 DIAGNOSIS — R0682 Tachypnea, not elsewhere classified: Secondary | ICD-10-CM | POA: Diagnosis not present

## 2017-10-31 DIAGNOSIS — I110 Hypertensive heart disease with heart failure: Secondary | ICD-10-CM | POA: Diagnosis present

## 2017-10-31 DIAGNOSIS — Z7902 Long term (current) use of antithrombotics/antiplatelets: Secondary | ICD-10-CM | POA: Diagnosis not present

## 2017-10-31 DIAGNOSIS — Z888 Allergy status to other drugs, medicaments and biological substances status: Secondary | ICD-10-CM | POA: Diagnosis not present

## 2017-10-31 DIAGNOSIS — E785 Hyperlipidemia, unspecified: Secondary | ICD-10-CM | POA: Diagnosis not present

## 2017-10-31 DIAGNOSIS — Z86718 Personal history of other venous thrombosis and embolism: Secondary | ICD-10-CM | POA: Diagnosis not present

## 2017-10-31 DIAGNOSIS — J9601 Acute respiratory failure with hypoxia: Secondary | ICD-10-CM | POA: Diagnosis not present

## 2017-10-31 DIAGNOSIS — Z79899 Other long term (current) drug therapy: Secondary | ICD-10-CM | POA: Diagnosis not present

## 2017-10-31 DIAGNOSIS — Z885 Allergy status to narcotic agent status: Secondary | ICD-10-CM | POA: Diagnosis not present

## 2017-10-31 DIAGNOSIS — J690 Pneumonitis due to inhalation of food and vomit: Secondary | ICD-10-CM | POA: Diagnosis present

## 2017-10-31 DIAGNOSIS — Z66 Do not resuscitate: Secondary | ICD-10-CM | POA: Diagnosis present

## 2017-10-31 DIAGNOSIS — J189 Pneumonia, unspecified organism: Secondary | ICD-10-CM | POA: Diagnosis not present

## 2017-10-31 DIAGNOSIS — Z8546 Personal history of malignant neoplasm of prostate: Secondary | ICD-10-CM | POA: Diagnosis not present

## 2017-10-31 DIAGNOSIS — J90 Pleural effusion, not elsewhere classified: Secondary | ICD-10-CM | POA: Diagnosis not present

## 2017-10-31 DIAGNOSIS — I509 Heart failure, unspecified: Secondary | ICD-10-CM | POA: Diagnosis present

## 2017-10-31 DIAGNOSIS — I480 Paroxysmal atrial fibrillation: Secondary | ICD-10-CM | POA: Diagnosis present

## 2017-10-31 DIAGNOSIS — R0602 Shortness of breath: Secondary | ICD-10-CM | POA: Diagnosis not present

## 2017-10-31 DIAGNOSIS — A419 Sepsis, unspecified organism: Secondary | ICD-10-CM | POA: Diagnosis present

## 2017-11-01 DIAGNOSIS — R0602 Shortness of breath: Secondary | ICD-10-CM

## 2017-11-17 DEATH — deceased

## 2017-12-22 ENCOUNTER — Telehealth: Payer: Self-pay | Admitting: *Deleted

## 2017-12-22 NOTE — Telephone Encounter (Signed)
Wife calling to say patient expired October 26, 2017, she will be mailing a cancer policy to [place] to help her with.

## 2017-12-24 ENCOUNTER — Ambulatory Visit: Payer: Medicare Other | Admitting: Oncology

## 2019-02-24 IMAGING — CR DG PELVIS 1-2V
1 series · 1 of 1 positions shown · non-contrast
Comparison: CT chest abdomen pelvis 11/19/2016

CLINICAL DATA: Fell 1 week ago, unable to stand/walk since, hip and
knee pain, mid femoral shaft pain, history of LEFT knee arthroplasty
2252, LEFT hip arthroplasty 4505, prostate cancer, hypertension

EXAM:
PELVIS - 1-2 VIEW

[t pelvis ap]
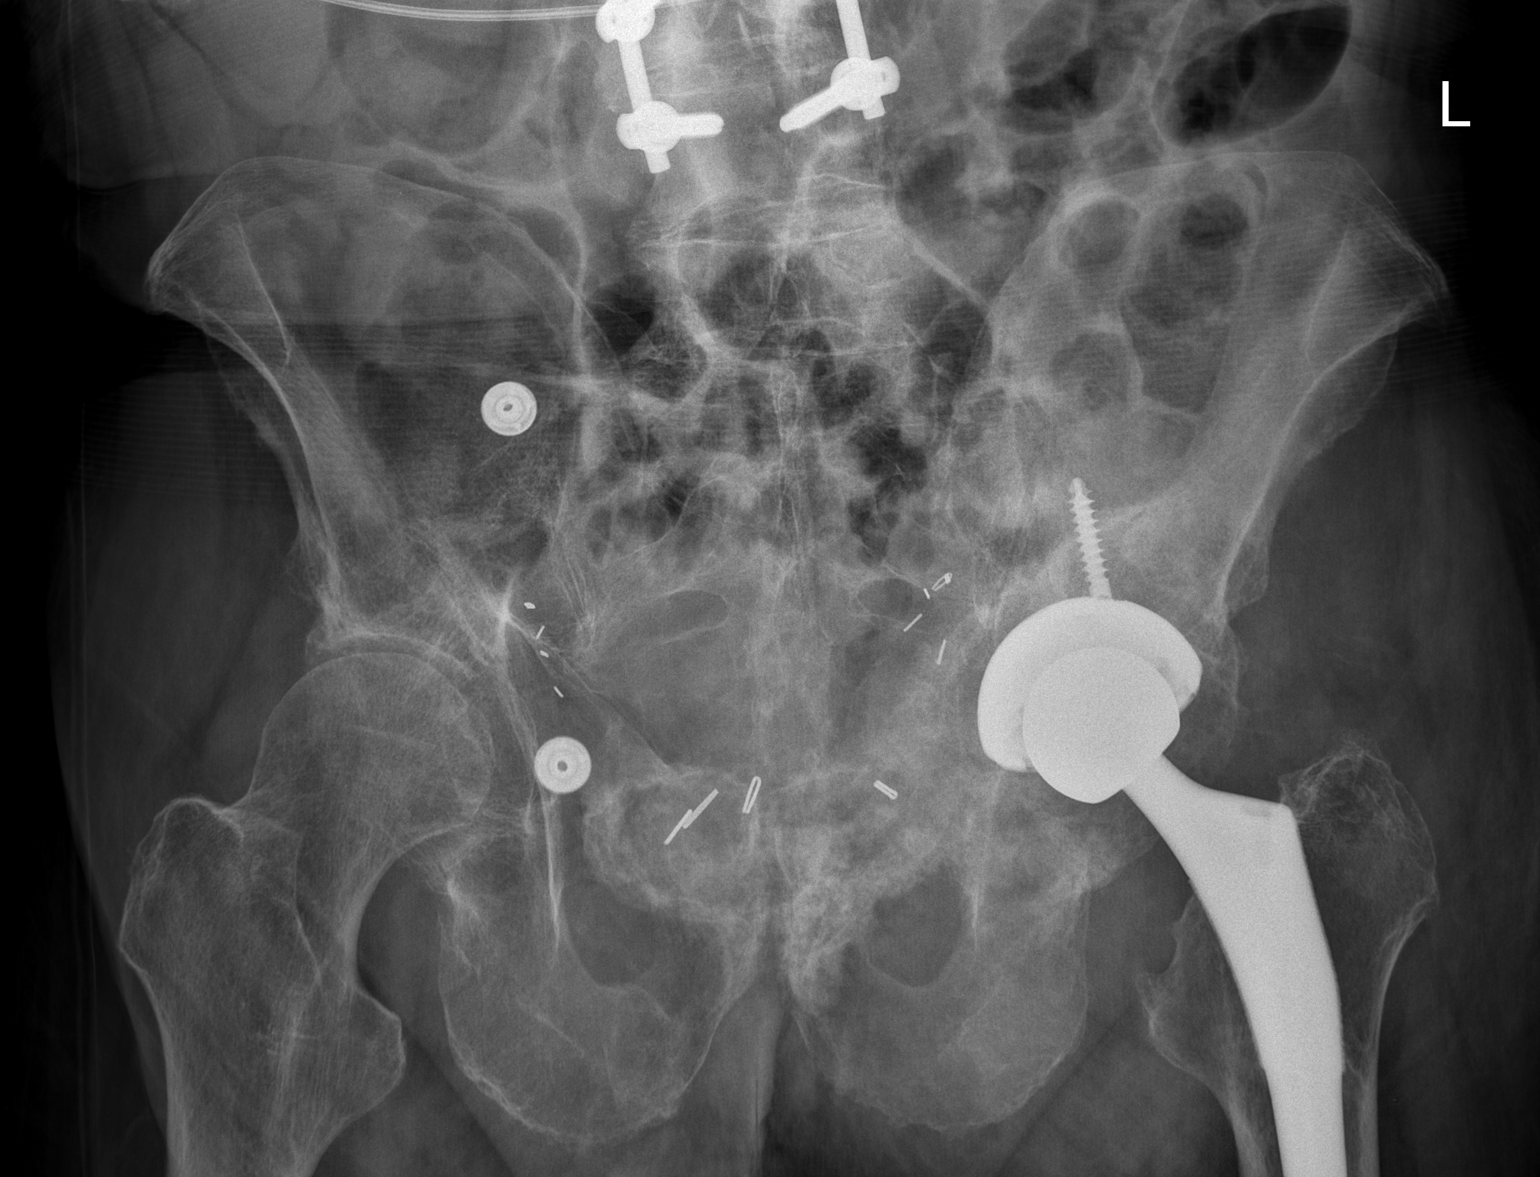

[1 of 1 positions shown; findings below may reference images not displayed]

FINDINGS: Marked osseous demineralization.

LEFT hip prosthesis.

Prior lower lumbar fusion.

RIGHT hip joint space and SI joint spaces preserved.

Sclerotic and expansile destructive process identified in the
anterior pelvis involving the superior pubic rami bilaterally,
BILATERAL pubic bodies, and extending into the LEFT acetabulum
compatible with metastatic prostate cancer.

Observed osseous changes are progressive particularly at the LEFT
periacetabular region and at the RIGHT superior pubic ramus/pubic
body.

No definite acute fracture or dislocation.
IMPRESSION: Osseous demineralization.

Progressive osseous metastatic disease involving the anterior pelvis
bilaterally as above.

## 2019-02-24 IMAGING — CR DG FEMUR 2+V*L*
4 series · 4 of 4 positions shown · non-contrast
Comparison: 11/26/2010

CLINICAL DATA: Fell 1 week ago, unable to stand/walk since, hip and
knee pain, mid femoral shaft pain, history of LEFT knee arthroplasty
8338, LEFT hip arthroplasty 8507, prostate cancer, hypertension

EXAM:
LEFT FEMUR 2 VIEWS

[t femur proximal ap left]
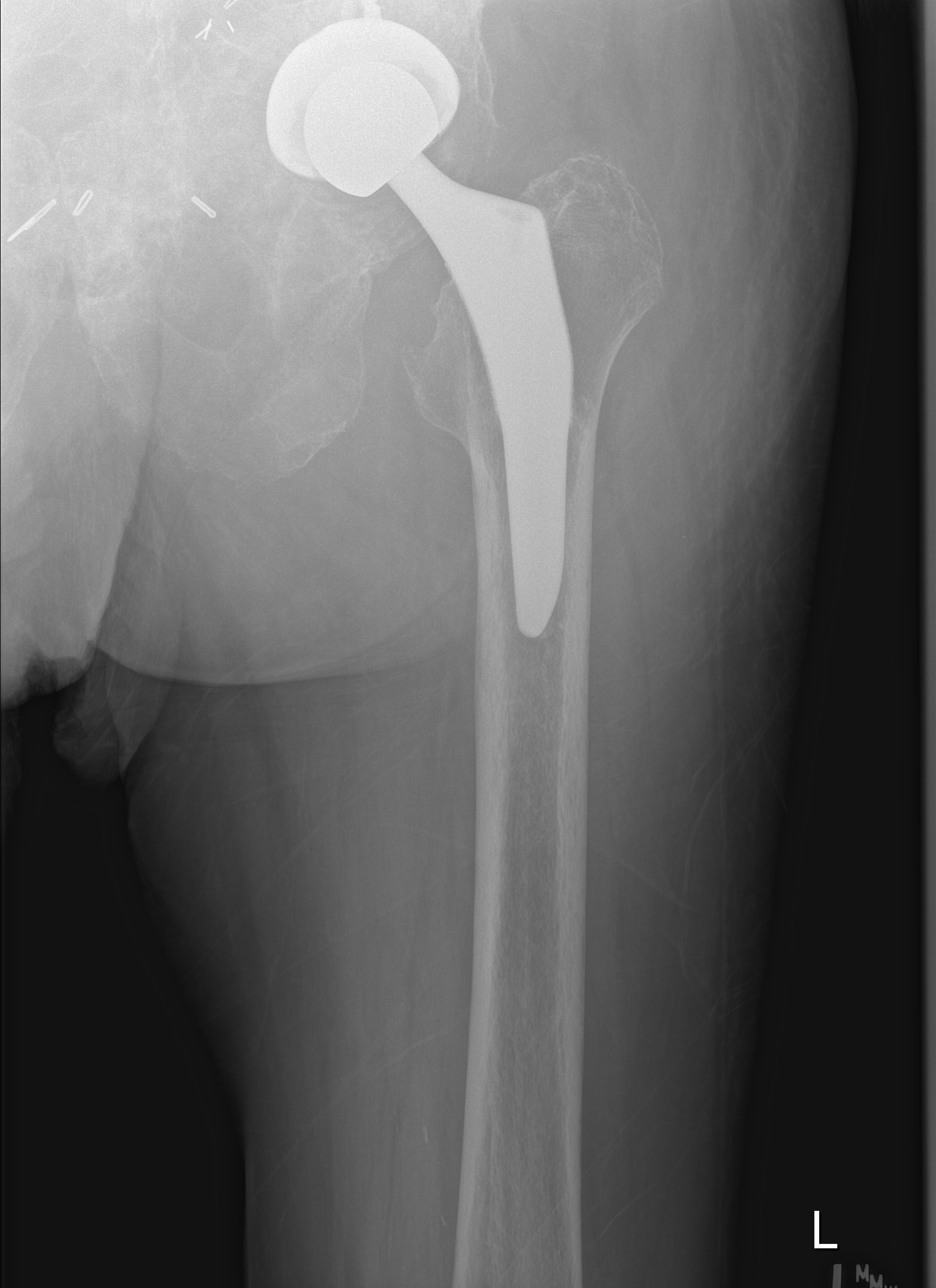

[t femur distal ap left]
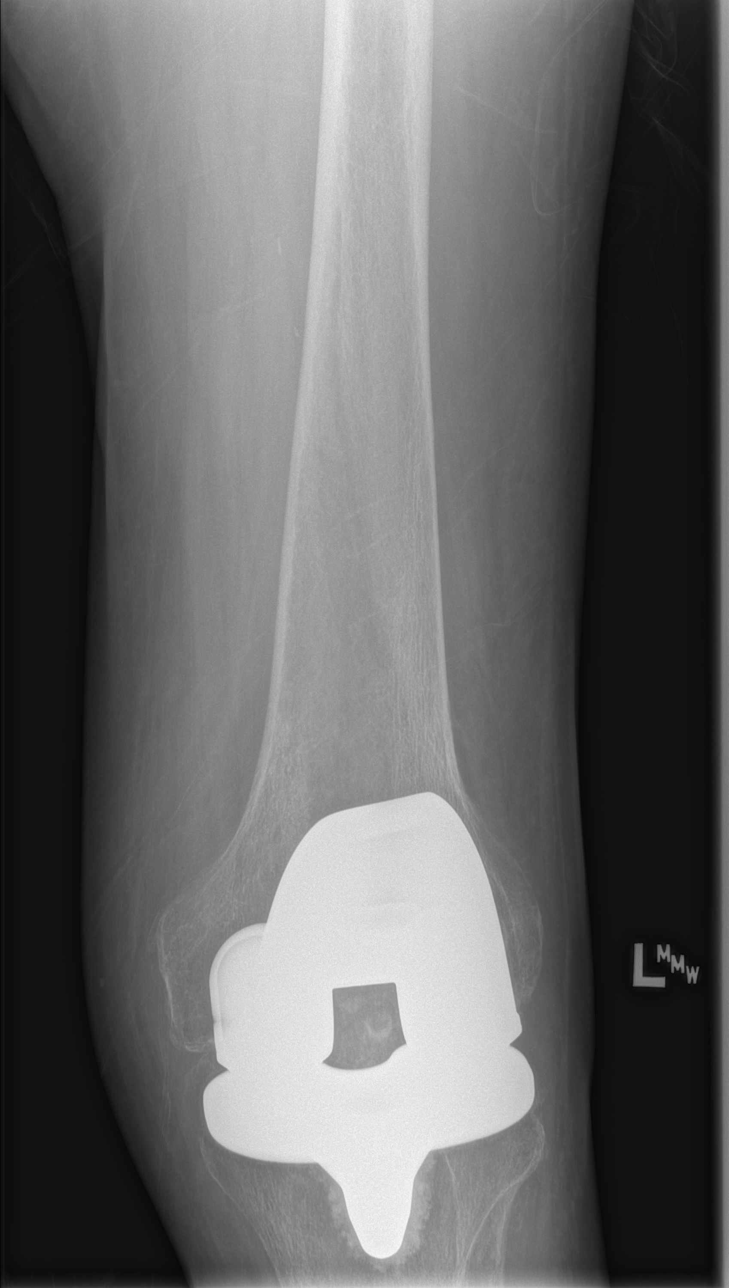

[t femur proximal lat left]
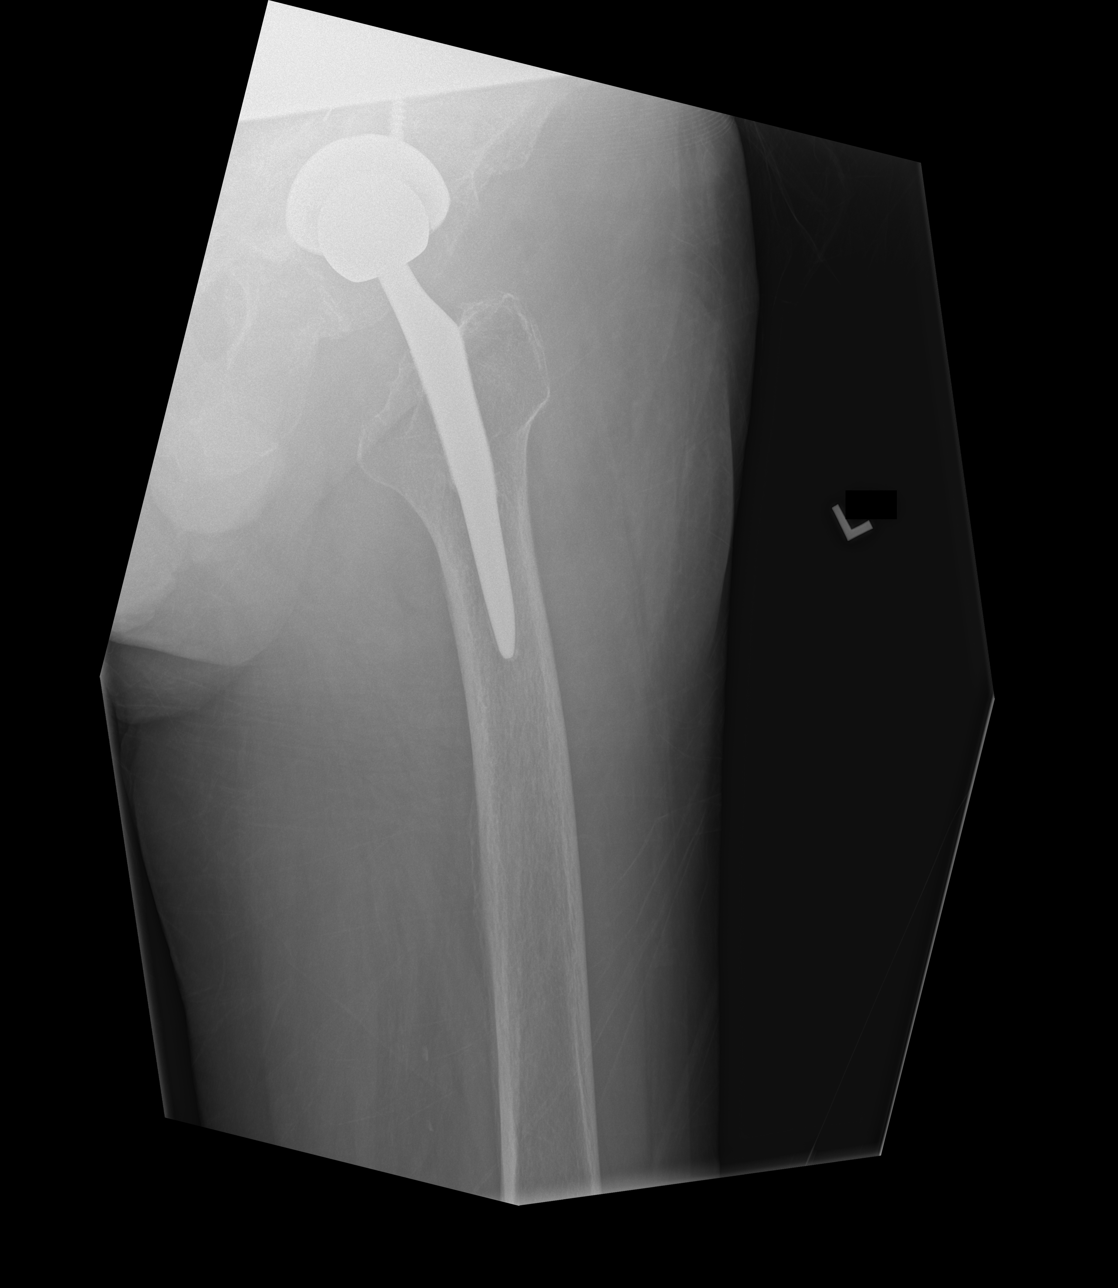

[x femur distal lat left]
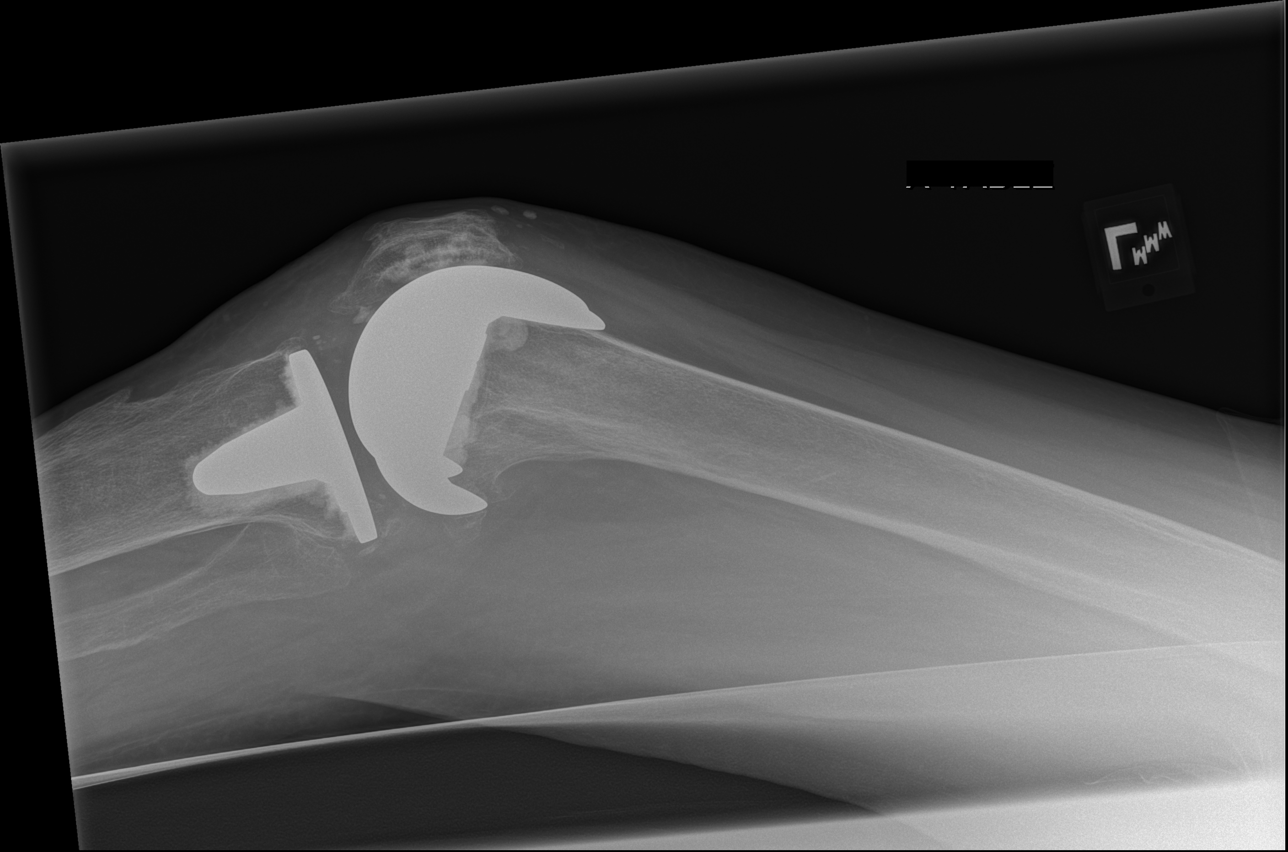

[4 of 4 positions shown; findings below may reference images not displayed]

FINDINGS: Diffuse osseous demineralization.

Components of LEFT knee prosthesis and LEFT hip prosthesis
identified.

No acute fracture, dislocation, or bone destruction within the LEFT
femur.

Progressive osseous metastatic disease of the anterior pelvis
identified, please refer to dedicated pelvic radiograph exam.
IMPRESSION: Post LEFT hip and LEFT knee arthroplasty.

Osseous demineralization without acute bony abnormalities.
